# Patient Record
Sex: Female | Born: 1975 | Hispanic: No | Marital: Married | Smoking: Former smoker
Health system: Southern US, Community
[De-identification: ages and names within clinical notes are randomized; demographics above are authoritative.]

## PROBLEM LIST (undated history)

## (undated) DIAGNOSIS — F32A Depression, unspecified: Secondary | ICD-10-CM

## (undated) DIAGNOSIS — F329 Major depressive disorder, single episode, unspecified: Secondary | ICD-10-CM

## (undated) DIAGNOSIS — C029 Malignant neoplasm of tongue, unspecified: Secondary | ICD-10-CM

## (undated) DIAGNOSIS — T884XXA Failed or difficult intubation, initial encounter: Secondary | ICD-10-CM

## (undated) DIAGNOSIS — N809 Endometriosis, unspecified: Secondary | ICD-10-CM

## (undated) HISTORY — DX: Endometriosis, unspecified: N80.9

## (undated) HISTORY — PX: ABDOMINAL HYSTERECTOMY: SHX81

## (undated) HISTORY — PX: COLON RESECTION: SHX5231

## (undated) MED FILL — Dexamethasone Sodium Phosphate Inj 100 MG/10ML: INTRAMUSCULAR | Qty: 1 | Status: AC

---

## 2010-04-19 ENCOUNTER — Emergency Department: Payer: Self-pay | Admitting: Emergency Medicine

## 2017-02-12 ENCOUNTER — Inpatient Hospital Stay
Admission: EM | Admit: 2017-02-12 | Discharge: 2017-02-15 | DRG: 761 | Disposition: A | Discharge status: Home / Self Care | Payer: Self-pay | Attending: Obstetrics and Gynecology | Admitting: Obstetrics and Gynecology

## 2017-02-12 ENCOUNTER — Emergency Department: Payer: Self-pay

## 2017-02-12 ENCOUNTER — Encounter: Payer: Self-pay | Admitting: Emergency Medicine

## 2017-02-12 DIAGNOSIS — N7093 Salpingitis and oophoritis, unspecified: Secondary | ICD-10-CM | POA: Diagnosis present

## 2017-02-12 DIAGNOSIS — F17210 Nicotine dependence, cigarettes, uncomplicated: Secondary | ICD-10-CM | POA: Diagnosis present

## 2017-02-12 DIAGNOSIS — R1031 Right lower quadrant pain: Secondary | ICD-10-CM

## 2017-02-12 DIAGNOSIS — N80129 Deep endometriosis of ovary, unspecified ovary: Secondary | ICD-10-CM | POA: Diagnosis present

## 2017-02-12 DIAGNOSIS — N802 Endometriosis of fallopian tube: Principal | ICD-10-CM | POA: Diagnosis present

## 2017-02-12 DIAGNOSIS — N809 Endometriosis, unspecified: Secondary | ICD-10-CM | POA: Diagnosis present

## 2017-02-12 LAB — CBC
HCT: 43.7 % (ref 35.0–47.0)
Hemoglobin: 14.9 g/dL (ref 12.0–16.0)
MCH: 30.9 pg (ref 26.0–34.0)
MCHC: 34 g/dL (ref 32.0–36.0)
MCV: 91 fL (ref 80.0–100.0)
Platelets: 265 10*3/uL (ref 150–440)
RBC: 4.8 MIL/uL (ref 3.80–5.20)
RDW: 13.9 % (ref 11.5–14.5)
WBC: 11 10*3/uL (ref 3.6–11.0)

## 2017-02-12 LAB — URINALYSIS, COMPLETE (UACMP) WITH MICROSCOPIC
BILIRUBIN URINE: NEGATIVE
Glucose, UA: NEGATIVE mg/dL
HGB URINE DIPSTICK: NEGATIVE
KETONES UR: 5 mg/dL — AB
LEUKOCYTES UA: NEGATIVE
NITRITE: NEGATIVE
PROTEIN: NEGATIVE mg/dL
Specific Gravity, Urine: 1.014 (ref 1.005–1.030)
pH: 5 (ref 5.0–8.0)

## 2017-02-12 LAB — COMPREHENSIVE METABOLIC PANEL
ALBUMIN: 4.7 g/dL (ref 3.5–5.0)
ALT: 10 U/L — ABNORMAL LOW (ref 14–54)
ANION GAP: 7 (ref 5–15)
AST: 15 U/L (ref 15–41)
Alkaline Phosphatase: 57 U/L (ref 38–126)
BILIRUBIN TOTAL: 0.7 mg/dL (ref 0.3–1.2)
BUN: 7 mg/dL (ref 6–20)
CO2: 25 mmol/L (ref 22–32)
Calcium: 9.4 mg/dL (ref 8.9–10.3)
Chloride: 106 mmol/L (ref 101–111)
Creatinine, Ser: 0.77 mg/dL (ref 0.44–1.00)
GFR calc Af Amer: >60 mL/min (ref >60)
GFR calc non Af Amer: >60 mL/min (ref >60)
GLUCOSE: 104 mg/dL — AB (ref 65–99)
POTASSIUM: 4.1 mmol/L (ref 3.5–5.1)
SODIUM: 138 mmol/L (ref 135–145)
TOTAL PROTEIN: 7.6 g/dL (ref 6.5–8.1)

## 2017-02-12 LAB — WET PREP, GENITAL
SPERM: NONE SEEN
Trich, Wet Prep: NONE SEEN
Yeast Wet Prep HPF POC: NONE SEEN

## 2017-02-12 LAB — LIPASE, BLOOD: LIPASE: 17 U/L (ref 11–51)

## 2017-02-12 LAB — CHLAMYDIA/NGC RT PCR (ARMC ONLY)
Chlamydia Tr: NOT DETECTED
N gonorrhoeae: NOT DETECTED

## 2017-02-12 LAB — DIFFERENTIAL
BASOS PCT: 0 %
Basophils Absolute: 0 10*3/uL (ref 0–0.1)
EOS PCT: 0 %
Eosinophils Absolute: 0 10*3/uL (ref 0–0.7)
LYMPHS PCT: 20 %
Lymphs Abs: 2.2 10*3/uL (ref 1.0–3.6)
Monocytes Absolute: 0.5 10*3/uL (ref 0.2–0.9)
Monocytes Relative: 5 %
NEUTROS PCT: 75 %
Neutro Abs: 8.1 10*3/uL — ABNORMAL HIGH (ref 1.4–6.5)

## 2017-02-12 LAB — POCT PREGNANCY, URINE: PREG TEST UR: NEGATIVE

## 2017-02-12 MED ORDER — LACTATED RINGERS IV SOLN
125.0000 mL/h | INTRAVENOUS | Status: DC
  Administered 2017-02-12 – 2017-02-15 (×7): 125 mL/h via INTRAVENOUS

## 2017-02-12 MED ORDER — DOXYCYCLINE HYCLATE 100 MG PO TABS
100.0000 mg | ORAL_TABLET | Freq: Two times a day (BID) | ORAL | Status: DC
  Administered 2017-02-12 – 2017-02-15 (×6): 100 mg via ORAL
  Filled 2017-02-12 (×8): qty 1

## 2017-02-12 MED ORDER — DEXTROSE 5 % IV SOLN
2.0000 g | Freq: Four times a day (QID) | INTRAVENOUS | Status: DC
  Administered 2017-02-12: 2 g via INTRAVENOUS
  Filled 2017-02-12 (×4): qty 2

## 2017-02-12 MED ORDER — IOPAMIDOL (ISOVUE-300) INJECTION 61%
100.0000 mL | Freq: Once | INTRAVENOUS | Status: AC | PRN
  Administered 2017-02-12: 100 mL via INTRAVENOUS
  Filled 2017-02-12: qty 100

## 2017-02-12 MED ORDER — NICOTINE 14 MG/24HR TD PT24
14.0000 mg | MEDICATED_PATCH | Freq: Every day | TRANSDERMAL | Status: DC
  Administered 2017-02-12 – 2017-02-14 (×3): 14 mg via TRANSDERMAL
  Filled 2017-02-12 (×3): qty 1

## 2017-02-12 MED ORDER — MORPHINE SULFATE (PF) 4 MG/ML IV SOLN
4.0000 mg | INTRAVENOUS | Status: DC | PRN
  Administered 2017-02-12: 4 mg via INTRAVENOUS
  Filled 2017-02-12: qty 1

## 2017-02-12 MED ORDER — ONDANSETRON HCL 4 MG/2ML IJ SOLN
4.0000 mg | Freq: Once | INTRAMUSCULAR | Status: AC
  Administered 2017-02-12: 4 mg via INTRAVENOUS
  Filled 2017-02-12: qty 2

## 2017-02-12 MED ORDER — MORPHINE SULFATE (PF) 2 MG/ML IV SOLN
INTRAVENOUS | Status: AC
  Administered 2017-02-12: 2 mg via INTRAVENOUS
  Filled 2017-02-12: qty 1

## 2017-02-12 MED ORDER — MORPHINE SULFATE (PF) 4 MG/ML IV SOLN
2.0000 mg | INTRAVENOUS | Status: DC | PRN
  Administered 2017-02-13 – 2017-02-15 (×5): 2 mg via INTRAVENOUS
  Filled 2017-02-12 (×5): qty 1

## 2017-02-12 MED ORDER — DEXTROSE 5 % IV SOLN
2.0000 g | Freq: Four times a day (QID) | INTRAVENOUS | Status: DC
  Administered 2017-02-13 (×2): 2 g via INTRAVENOUS
  Filled 2017-02-12 (×5): qty 2

## 2017-02-12 NOTE — ED Triage Notes (Signed)
C/O RLQ abdominal pain. Onset, 40 minutes PTA.  Pain associated with nausea and feeling flushed.  Patient took an Alleve, and pain has improved.

## 2017-02-12 NOTE — ED Provider Notes (Signed)
[facility] Emergency Department Provider Note    First MD Initiated Contact with Patient 02/12/17 1426     (approximate)  I have reviewed the triage vital signs and the nursing notes.   HISTORY  Chief Complaint Abdominal Pain    HPI Holly Vega is a 41 y.o. female who is previously healthy presents with acute onset of right lower quadrant pain that occurred roughly 40 minutes prior to arrival. States that over the past several days she has been feeling some achy pain and her periumbilical region that has radiated to the right lower quadrant. Prior to arrival she states that she was having severe 10 out of 10 pain with nausea and feeling flushed. Subjective fevers. States the pain lasted roughly 40 minutes and then suddenly dissipated. She took an Aleve thinking the ER. Denies any chance of being pregnant. No vaginal discharge or bleeding. No previous abdominal surgeries.   History reviewed. No pertinent past medical history. Brady: no h/o IBD History reviewed. No pertinent surgical history. There are no active problems to display for this patient.     Prior to Admission medications   Not on File    Allergies Patient has no known allergies.    Social History Social History  Substance Use Topics  . Smoking status: Current Every Day Smoker    Types: Cigarettes  . Smokeless tobacco: Never Used  . Alcohol use No    Review of Systems Patient denies headaches, rhinorrhea, blurry vision, numbness, shortness of breath, chest pain, edema, cough, abdominal pain, nausea, vomiting, diarrhea, dysuria, fevers, rashes or hallucinations unless otherwise stated above in HPI. ____________________________________________   PHYSICAL EXAM:  VITAL SIGNS: Vitals:   02/12/17 1313 02/12/17 1424  BP: 139/86 112/73  Pulse: 86 72  Resp: 18 16  Temp: 98.6 F (37 C)     Constitutional: Alert and oriented. Well appearing and in no acute distress. Eyes:  Conjunctivae are normal. PERRL. EOMI. Head: Atraumatic. Nose: No congestion/rhinnorhea. Mouth/Throat: Mucous membranes are moist.  Oropharynx non-erythematous. Neck: No stridor. Painless ROM. No cervical spine tenderness to palpation Hematological/Lymphatic/Immunilogical: No cervical lymphadenopathy. Cardiovascular: Normal rate, regular rhythm. Grossly normal heart sounds.  Good peripheral circulation. Respiratory: Normal respiratory effort.  No retractions. Lungs CTAB. Gastrointestinal: Soft with + rLQ ttp but no rebound or guarding. No distention. No abdominal bruits. No CVA tenderness. Genitourinary: pelvis exam with right adnexal fullness and ttp.  No significant drainage from cervix or cmt Musculoskeletal: No lower extremity tenderness nor edema.  No joint effusions. Neurologic:  Normal speech and language. No gross focal neurologic deficits are appreciated. No gait instability. Skin:  Skin is warm, dry and intact. No rash noted. Psychiatric: Mood and affect are normal. Speech and behavior are normal.  ____________________________________________   LABS (all labs ordered are listed, but only abnormal results are displayed)  Results for orders placed or performed during the hospital encounter of 02/12/17 (from the past 24 hour(s))  Lipase, blood     Status: None   Collection Time: 02/12/17  1:27 PM  Result Value Ref Range   Lipase 17 11 - 51 U/L  Comprehensive metabolic panel     Status: Abnormal   Collection Time: 02/12/17  1:27 PM  Result Value Ref Range   Sodium 138 135 - 145 mmol/L   Potassium 4.1 3.5 - 5.1 mmol/L   Chloride 106 101 - 111 mmol/L   CO2 25 22 - 32 mmol/L   Glucose, Bld 104 (H) 65 - 99 mg/dL  BUN 7 6 - 20 mg/dL   Creatinine, Ser 0.77 0.44 - 1.00 mg/dL   Calcium 9.4 8.9 - 10.3 mg/dL   Total Protein 7.6 6.5 - 8.1 g/dL   Albumin 4.7 3.5 - 5.0 g/dL   AST 15 15 - 41 U/L   ALT 10 (L) 14 - 54 U/L   Alkaline Phosphatase 57 38 - 126 U/L   Total Bilirubin 0.7 0.3  - 1.2 mg/dL   GFR calc non Af Amer >60 >60 mL/min   GFR calc Af Amer >60 >60 mL/min   Anion gap 7 5 - 15  CBC     Status: None   Collection Time: 02/12/17  1:27 PM  Result Value Ref Range   WBC 11.0 3.6 - 11.0 K/uL   RBC 4.80 3.80 - 5.20 MIL/uL   Hemoglobin 14.9 12.0 - 16.0 g/dL   HCT 43.7 35.0 - 47.0 %   MCV 91.0 80.0 - 100.0 fL   MCH 30.9 26.0 - 34.0 pg   MCHC 34.0 32.0 - 36.0 g/dL   RDW 13.9 11.5 - 14.5 %   Platelets 265 150 - 440 K/uL  Urinalysis, Complete w Microscopic     Status: Abnormal   Collection Time: 02/12/17  1:27 PM  Result Value Ref Range   Color, Urine YELLOW (A) YELLOW   APPearance HAZY (A) CLEAR   Specific Gravity, Urine 1.014 1.005 - 1.030   pH 5.0 5.0 - 8.0   Glucose, UA NEGATIVE NEGATIVE mg/dL   Hgb urine dipstick NEGATIVE NEGATIVE   Bilirubin Urine NEGATIVE NEGATIVE   Ketones, ur 5 (A) NEGATIVE mg/dL   Protein, ur NEGATIVE NEGATIVE mg/dL   Nitrite NEGATIVE NEGATIVE   Leukocytes, UA NEGATIVE NEGATIVE   RBC / HPF 0-5 0 - 5 RBC/hpf   WBC, UA 0-5 0 - 5 WBC/hpf   Bacteria, UA RARE (A) NONE SEEN   Squamous Epithelial / LPF 0-5 (A) NONE SEEN   Mucous PRESENT   Pregnancy, urine POC     Status: None   Collection Time: 02/12/17  1:35 PM  Result Value Ref Range   Preg Test, Ur NEGATIVE NEGATIVE   ____________________________________________ ____________________________________________  RADIOLOGY I personally reviewed all radiographic images ordered to evaluate for the above acute complaints and reviewed radiology reports and findings.  These findings were personally discussed with the patient.  Please see medical record for radiology report.  ____________________________________________   PROCEDURES  Procedure(s) performed:  Procedures    Critical Care performed: no ____________________________________________   INITIAL IMPRESSION / ASSESSMENT AND PLAN / ED COURSE  Pertinent labs & imaging results that were available during my care of the  patient were reviewed by me and considered in my medical decision making (see chart for details).  DDX: appy, stone, uti, cyst  Holly Vega is a 41 y.o. who presents to the ED with acute lower quadrant pain as described above. Patient is AFVSS in ED. Exam as above. Given current presentation have considered the above differential.  Blood work is fairly reassuring. Less consistent with UTI or stone. Based on sudden onset of severe pain in the right lower quadrant do feel that CT imaging clinically indicated as she does have borderline elevated leukocytosis and concern for acute appendicitis.  Clinical Course as of Feb 12 1930  Sun Feb 12, 2017  1523 CT notes possible abscess vs cyst. We'll order ultrasound to further characterize fluid collection in the right lower quadrant.  [PR]    Clinical Course User Index [PR] Quentin Cornwall,  Saralyn Pilar, MD   Ultrasound shows evidence of probable pyosalpinx and tubo-ovarian abscess. Based on these results and consult to Dr. Glennon Mac with gynecology has evaluated patient. She is not overtly septic at this point I have held off on antibiotics and consultation with Dr. Glennon Mac. He is currently agreed to admit patient for further evaluation and management.  Have discussed with the patient and available family all diagnostics and treatments performed thus far and all questions were answered to the best of my ability. The patient demonstrates understanding and agreement with plan.   ____________________________________________   FINAL CLINICAL IMPRESSION(S) / ED DIAGNOSES  Final diagnoses:  Abdominal pain, RLQ  Tubo-ovarian abscess      NEW MEDICATIONS STARTED DURING THIS VISIT:  New Prescriptions   No medications on file     Note:  This document was prepared using Dragon voice recognition software and may include unintentional dictation errors.    Merlyn Lot, MD 02/12/17 [phone]

## 2017-02-13 DIAGNOSIS — N7093 Salpingitis and oophoritis, unspecified: Secondary | ICD-10-CM

## 2017-02-13 DIAGNOSIS — N802 Endometriosis of fallopian tube: Secondary | ICD-10-CM

## 2017-02-13 MED ORDER — CEFOXITIN SODIUM-DEXTROSE 2-2.2 GM-% IV SOLR (PREMIX)
2.0000 g | Freq: Four times a day (QID) | INTRAVENOUS | Status: DC
  Administered 2017-02-13 – 2017-02-15 (×7): 2000 mg via INTRAVENOUS
  Filled 2017-02-13 (×11): qty 50

## 2017-02-13 NOTE — Progress Notes (Signed)
Obstetric and Gynecology  Subjective  Feeling better since admission, still some discomfort lower abdomen midline to slightly right of midline.  No fevers, no chills.  Tolerating po  Objective   Vitals:   02/13/17 0356 02/13/17 0748  BP: (!) 100/54 (!) 108/57  Pulse: 65 67  Resp: 17 17  Temp: 98 F (36.7 C) 98 F (36.7 C)     Intake/Output Summary (Last 24 hours) at 02/13/17 0753 Last data filed at 02/13/17 0865  Gross per 24 hour  Intake          1061.25 ml  Output              800 ml  Net           261.25 ml    General: NAD Pulmonary: no increased work of breathing Abdomen: soft, non-distended, some suprapubic tenderness slightly right of midline, no rebound, no guarding Extremities: no edema  Labs: Results for orders placed or performed during the hospital encounter of 02/12/17 (from the past 24 hour(s))  Lipase, blood     Status: None   Collection Time: 02/12/17  1:27 PM  Result Value Ref Range   Lipase 17 11 - 51 U/L  Comprehensive metabolic panel     Status: Abnormal   Collection Time: 02/12/17  1:27 PM  Result Value Ref Range   Sodium 138 135 - 145 mmol/L   Potassium 4.1 3.5 - 5.1 mmol/L   Chloride 106 101 - 111 mmol/L   CO2 25 22 - 32 mmol/L   Glucose, Bld 104 (H) 65 - 99 mg/dL   BUN 7 6 - 20 mg/dL   Creatinine, Ser 0.77 0.44 - 1.00 mg/dL   Calcium 9.4 8.9 - 10.3 mg/dL   Total Protein 7.6 6.5 - 8.1 g/dL   Albumin 4.7 3.5 - 5.0 g/dL   AST 15 15 - 41 U/L   ALT 10 (L) 14 - 54 U/L   Alkaline Phosphatase 57 38 - 126 U/L   Total Bilirubin 0.7 0.3 - 1.2 mg/dL   GFR calc non Af Amer >60 >60 mL/min   GFR calc Af Amer >60 >60 mL/min   Anion gap 7 5 - 15  CBC     Status: None   Collection Time: 02/12/17  1:27 PM  Result Value Ref Range   WBC 11.0 3.6 - 11.0 K/uL   RBC 4.80 3.80 - 5.20 MIL/uL   Hemoglobin 14.9 12.0 - 16.0 g/dL   HCT 43.7 35.0 - 47.0 %   MCV 91.0 80.0 - 100.0 fL   MCH 30.9 26.0 - 34.0 pg   MCHC 34.0 32.0 - 36.0 g/dL   RDW 13.9 11.5 - 14.5  %   Platelets 265 150 - 440 K/uL  Urinalysis, Complete w Microscopic     Status: Abnormal   Collection Time: 02/12/17  1:27 PM  Result Value Ref Range   Color, Urine YELLOW (A) YELLOW   APPearance HAZY (A) CLEAR   Specific Gravity, Urine 1.014 1.005 - 1.030   pH 5.0 5.0 - 8.0   Glucose, UA NEGATIVE NEGATIVE mg/dL   Hgb urine dipstick NEGATIVE NEGATIVE   Bilirubin Urine NEGATIVE NEGATIVE   Ketones, ur 5 (A) NEGATIVE mg/dL   Protein, ur NEGATIVE NEGATIVE mg/dL   Nitrite NEGATIVE NEGATIVE   Leukocytes, UA NEGATIVE NEGATIVE   RBC / HPF 0-5 0 - 5 RBC/hpf   WBC, UA 0-5 0 - 5 WBC/hpf   Bacteria, UA RARE (A) NONE SEEN   Squamous  Epithelial / LPF 0-5 (A) NONE SEEN   Mucous PRESENT   Differential     Status: Abnormal   Collection Time: 02/12/17  1:27 PM  Result Value Ref Range   Neutrophils Relative % 75 %   Neutro Abs 8.1 (H) 1.4 - 6.5 K/uL   Lymphocytes Relative 20 %   Lymphs Abs 2.2 1.0 - 3.6 K/uL   Monocytes Relative 5 %   Monocytes Absolute 0.5 0.2 - 0.9 K/uL   Eosinophils Relative 0 %   Eosinophils Absolute 0.0 0 - 0.7 K/uL   Basophils Relative 0 %   Basophils Absolute 0.0 0 - 0.1 K/uL  Pregnancy, urine POC     Status: None   Collection Time: 02/12/17  1:35 PM  Result Value Ref Range   Preg Test, Ur NEGATIVE NEGATIVE  Chlamydia/NGC rt PCR ([facility] only)     Status: None   Collection Time: 02/12/17  5:22 PM  Result Value Ref Range   Specimen source GC/Chlam ENDOCERVICAL    Chlamydia Tr NOT DETECTED NOT DETECTED   N gonorrhoeae NOT DETECTED NOT DETECTED  Wet prep, genital     Status: Abnormal   Collection Time: 02/12/17  5:22 PM  Result Value Ref Range   Yeast Wet Prep HPF POC NONE SEEN NONE SEEN   Trich, Wet Prep NONE SEEN NONE SEEN   Clue Cells Wet Prep HPF POC PRESENT (A) NONE SEEN   WBC, Wet Prep HPF POC FEW (A) NONE SEEN   Sperm NONE SEEN     Cultures: Results for orders placed or performed during the hospital encounter of 02/12/17  Chlamydia/NGC rt PCR ([facility] only)      Status: None   Collection Time: 02/12/17  5:22 PM  Result Value Ref Range Status   Specimen source GC/Chlam ENDOCERVICAL  Final   Chlamydia Tr NOT DETECTED NOT DETECTED Final   N gonorrhoeae NOT DETECTED NOT DETECTED Final    Comment: (NOTE) 100  This methodology has not been evaluated in pregnant women or in 200  patients with a history of hysterectomy. 300 400  This methodology will not be performed on patients less than 13  years of age.   Wet prep, genital     Status: Abnormal   Collection Time: 02/12/17  5:22 PM  Result Value Ref Range Status   Yeast Wet Prep HPF POC NONE SEEN NONE SEEN Final   Trich, Wet Prep NONE SEEN NONE SEEN Final   Clue Cells Wet Prep HPF POC PRESENT (A) NONE SEEN Final   WBC, Wet Prep HPF POC FEW (A) NONE SEEN Final   Sperm NONE SEEN  Final    Imaging:  Assessment   41 y.o. HD2 TOA  Plan   1) TOA - will be 24-hrs of cefoxitin/doxy this afternoon.  Anticipated 48-hrs course.  If further improvement with IV abx discussed discharge at 48-hr mark with 14 days course of doxycyline outpatient.  If no improvement we discussed IR drainage.

## 2017-02-13 NOTE — H&P (Addendum)
GYNECOLOGY ADMISSION HISTORY AND PHYSICAL NOTE    Attending Provider: Prentice Docker, MD   Holly Vega [id number]  Chief Complaint:   Holly Vega is a 41 y.o. G3P1A2 female seen at the request of Dr Merlyn Lot for evaluation of suspected tubo-ovarian abscess.    History of Present Ilness:   The patient presented to the ER with a sudden onset of peri-umbilical and right lower quadrant abdominal pain. She describes the pain as being crampy, like a bad period.  The pain does not radiate. It comes and goes.  When present, the pain level is "12" out of 10.  Nothing makes it worse. Ibuprofen helps a little.  No associated symptoms. Specifically denies fevers, chills, nausea, emesis, diarrhea, constipation.  She does not a slight change in her discharge, but no other specific vaginal symptoms.  Denies dysuria, hematuria, frequency. Denies hematochezia and melena.    Past Medical History: negative  Past Surgica History: negative  Allergies: No Known Allergies   Prior to Admission medications   Medication Sig Start Date End Date Taking? Authorizing Provider  naproxen sodium (ANAPROX) 220 MG tablet Take 220 mg by mouth 2 (two) times daily with a meal.   Yes [provider]    Obstetric History: [id number], s/p SVD x 1.    Gynecologic History:   Menarche age: 65 Patient's last menstrual period was 02/09/2017. Cycle Hx: regular every 28 days without intermenstrual spotting Last Pap: patient unsure She denies a history of abnormal pap smears  She denies history of STDs Contraception: denies   Social History:  She  reports that she has been smoking Cigarettes.  She has never used smokeless tobacco. She reports that she does not drink alcohol or use drugs.  Family History:  Denies family history of gynecologic cancers. Notes no other family medical history.   Review of Systems:   Review of Systems  Constitutional: Negative.  Negative for chills and fever.  HENT: Negative.    Eyes: Negative.   Respiratory: Negative.   Cardiovascular: Negative.   Gastrointestinal: Positive for abdominal pain (RLQ, periumbilical area). Negative for blood in stool, constipation, diarrhea, melena, nausea and vomiting.  Genitourinary: Negative.  Negative for dysuria, flank pain, frequency, hematuria and urgency.  Musculoskeletal: Negative.  Negative for back pain.  Skin: Negative.   Neurological: Negative.   Endo/Heme/Allergies: Does not bruise/bleed easily.  Psychiatric/Behavioral: Negative.      Objective    BP (!) 108/57 (BP Location: Left Arm)   Pulse 67   Temp 98 F (36.7 C) (Axillary)   Resp 17   Ht 5\' 7"  (1.702 m)   Wt 153 lb (69.4 kg)   LMP 02/09/2017 Comment: neg preg test 02/12/17  SpO2 99%   BMI 23.96 kg/m  Physical Exam  Physical Exam  Constitutional: She is oriented to person, place, and time. She appears well-developed and well-nourished. No distress.  HENT:  Head: Normocephalic and atraumatic.  Eyes: Conjunctivae are normal.  Neck: Normal range of motion. Neck supple. No thyromegaly present.  Cardiovascular: Normal rate, regular rhythm and normal heart sounds.  Exam reveals no gallop and no friction rub.   No murmur heard. Pulmonary/Chest: Effort normal and breath sounds normal. She has no wheezes.  Abdominal: Soft. She exhibits no distension and no mass. There is tenderness (mild ttp in RLQ). There is no rebound and no guarding. No hernia. Hernia confirmed negative in the right inguinal area and confirmed negative in the left inguinal area.  Genitourinary: Pelvic exam was performed with  patient supine. There is no rash, tenderness or lesion on the right labia. There is no rash, tenderness or lesion on the left labia.  Genitourinary Comments: Deferred by me. Performed by ER MD with collection of samples for GC/chlamydia  Musculoskeletal: Normal range of motion.  Lymphadenopathy:       Right: No inguinal adenopathy present.       Left: No inguinal  adenopathy present.  Neurological: She is alert and oriented to person, place, and time.  Skin: Skin is warm and dry. No rash noted.  Psychiatric: She has a normal mood and affect. Her behavior is normal.    Laboratory Results:   Lab Results  Component Value Date   WBC 11.0 02/12/2017   RBC 4.80 02/12/2017   HGB 14.9 02/12/2017   HCT 43.7 02/12/2017   PLT 265 02/12/2017   NA 138 02/12/2017   K 4.1 02/12/2017   CREATININE 0.77 02/12/2017   Lab Results  Component Value Date   PREGTESTUR NEGATIVE 02/12/2017    Imaging Results:  US Transvaginal Non-ob  Result Date: 02/12/2017 CLINICAL DATA:  Right lower quadrant abdominal pain with abnormal cystic lesion in the right adnexal on recent CT examination. EXAM: TRANSABDOMINAL AND TRANSVAGINAL ULTRASOUND OF PELVIS TECHNIQUE: Both transabdominal and transvaginal ultrasound examinations of the pelvis were performed. Transabdominal technique was performed for global imaging of the pelvis including uterus, ovaries, adnexal regions, and pelvic cul-de-sac. It was necessary to proceed with endovaginal exam following the transabdominal exam to visualize the ovaries. COMPARISON:  CT from earlier in the same day. FINDINGS: Uterus Measurements: 7.2 x 3.0 x 4.4 cm. No fibroids or other mass visualized. Endometrium Thickness: 12 mm.  No focal abnormality visualized. Right ovary Measurements: 7.1 x 5.8 x 7.0 cm. Large 6.8 cm cystic area is noted emanating from the right ovary similar to that seen on recent CT examination. Internal echogenicity consistent with debris is noted. There is an adjacent area of dilatation with echogenicity within similar to that seen on recent CT likely representing a dilated fallopian tube. In the internal debris this likely represents a pyosalpinx. Left ovary Measurements: 4.2 x 1.6 x 2.2 cm. Normal appearance/no adnexal mass. Other findings No abnormal free fluid. IMPRESSION: Changes most consistent with a right tubo-ovarian abscess.  Electronically Signed   By: Inez Catalina M.D.   On: 02/12/2017 16:49   US Pelvis Complete  Result Date: 02/12/2017 CLINICAL DATA:  Right lower quadrant abdominal pain with abnormal cystic lesion in the right adnexal on recent CT examination. EXAM: TRANSABDOMINAL AND TRANSVAGINAL ULTRASOUND OF PELVIS TECHNIQUE: Both transabdominal and transvaginal ultrasound examinations of the pelvis were performed. Transabdominal technique was performed for global imaging of the pelvis including uterus, ovaries, adnexal regions, and pelvic cul-de-sac. It was necessary to proceed with endovaginal exam following the transabdominal exam to visualize the ovaries. COMPARISON:  CT from earlier in the same day. FINDINGS: Uterus Measurements: 7.2 x 3.0 x 4.4 cm. No fibroids or other mass visualized. Endometrium Thickness: 12 mm.  No focal abnormality visualized. Right ovary Measurements: 7.1 x 5.8 x 7.0 cm. Large 6.8 cm cystic area is noted emanating from the right ovary similar to that seen on recent CT examination. Internal echogenicity consistent with debris is noted. There is an adjacent area of dilatation with echogenicity within similar to that seen on recent CT likely representing a dilated fallopian tube. In the internal debris this likely represents a pyosalpinx. Left ovary Measurements: 4.2 x 1.6 x 2.2 cm. Normal appearance/no adnexal mass. Other findings  No abnormal free fluid. IMPRESSION: Changes most consistent with a right tubo-ovarian abscess. Electronically Signed   By: Inez Catalina M.D.   On: 02/12/2017 16:49   Ct Abdomen Pelvis W Contrast  Result Date: 02/12/2017 CLINICAL DATA:  Acute onset RIGHT lower quadrant pain, nausea. Assess for appendicitis. EXAM: CT ABDOMEN AND PELVIS WITH CONTRAST TECHNIQUE: Multidetector CT imaging of the abdomen and pelvis was performed using the standard protocol following bolus administration of intravenous contrast. CONTRAST:  159mL ISOVUE-300 IOPAMIDOL (ISOVUE-300) INJECTION 61%  COMPARISON:  None. FINDINGS: LOWER CHEST: Lung bases are clear. Included heart size is normal. No pericardial effusion. HEPATOBILIARY: Liver and gallbladder are normal. PANCREAS: Normal. SPLEEN: Normal. ADRENALS/URINARY TRACT: Kidneys are orthotopic, demonstrating symmetric enhancement. No nephrolithiasis, hydronephrosis or solid renal masses. The unopacified ureters are normal in course and caliber. Delayed imaging through the kidneys demonstrates symmetric prompt contrast excretion within the proximal urinary collecting system. Urinary bladder is partially distended and unremarkable. Normal adrenal glands. STOMACH/BOWEL: The stomach, small and large bowel are normal in course and caliber without inflammatory changes. Normal appendix. VASCULAR/LYMPHATIC: Aortoiliac vessels are normal in course and caliber, mild calcific atherosclerosis. No lymphadenopathy by CT size criteria. REPRODUCTIVE: 8.1 x 5.9 cm eccentrically thick-walled RIGHT adnexal cyst. Tubular fluid-filled 2.8 cm structure RIGHT lower quadrant. Multiple follicles measuring to 8 mm LEFT adnexum. OTHER: No intraperitoneal free fluid or free air. MUSCULOSKELETAL: Grade 1 L5-S1 retrolisthesis with severe degenerative disc with vacuum phenomena, mild canal stenosis and moderate neural foraminal narrowing. IMPRESSION: RIGHT adnexal 8.1 cm cyst versus abscess with ipsilateral hydro- versus pyosalpinx. Recommend ultrasound and correlation with gynecological examination. Normal appendix. Electronically Signed   By: Elon Alas M.D.   On: 02/12/2017 15:08      Assessment & Plan   Holly Vega is a 41 y.o. G58P1021 female with likely TOA and pyosalpynx.    Plan:  1.  Size and clinical stability indicate patient would be a good candidate for inpatient IV antibiotic treatment. Will start cefoxitin 2 gram Q 6 and doxycycline 100 mg po BID, per CDC recommendations.  Will not initially give metronidazole.   2.  Discussed that if she does not respond,  may have to consider IR drainage.    Prentice Docker, MD 02/13/2017 8:38 AM

## 2017-02-14 ENCOUNTER — Inpatient Hospital Stay: Payer: Self-pay

## 2017-02-14 DIAGNOSIS — N802 Endometriosis of fallopian tube: Secondary | ICD-10-CM

## 2017-02-14 DIAGNOSIS — N7093 Salpingitis and oophoritis, unspecified: Secondary | ICD-10-CM

## 2017-02-14 LAB — HIV ANTIBODY (ROUTINE TESTING W REFLEX): HIV SCREEN 4TH GENERATION: NONREACTIVE

## 2017-02-14 NOTE — Progress Notes (Signed)
Patient ID: Holly Vega, female   DOB: 1975-10-05, 41 y.o.   MRN: [id number]  Daily Benign Gynecology Progress Note Holly Vega  [id number]  HD#3  Subjective:  Overnight Events: no acute events Complaints: none currently. Pain improved.  She denies: fevers, chills, chest pain, trouble breathing, nausea, vomiting, severe abdominal pain.  She has tolerated: normal diet as tolerated She reports her pain is well controlled with PO pain meds.   She is ambulating and is voiding.  Objective:  Most recent vitals Temp: 98.7 F (37.1 C)  BP: 117/63  Pulse Rate: 63  Resp: 20  SpO2: 98 %   Vitals Range over 24 hours Temp  Avg: 98.2 F (36.8 C)  Min: 97.8 F (36.6 C)  Max: 98.7 F (37.1 C) BP  Min: 100/65  Max: 132/56 Pulse  Avg: 59.8  Min: 54  Max: 67 SpO2  Avg: 98.3 %  Min: 97 %  Max: 100 %   Physical Exam General: alert, well appearing, and in no distress Heart: regular rate and rhythm, no murmurs Lungs: clear to auscultation, no wheezes, rales or rhonchi, symmetric air entry Abdomen: Soft, mildly ttp in RLQ, +BS, no guarding or rebound Extremities: no redness or tenderness in the calves or thighs, no edema, TED hose in place  AM Labs Lab Results  Component Value Date   WBC 11.0 02/12/2017   HGB 14.9 02/12/2017   HCT 43.7 02/12/2017   PLT 265 02/12/2017   NA 138 02/12/2017   K 4.1 02/12/2017   CREATININE 0.77 02/12/2017   BUN 7 02/12/2017     Assessment:  Holly Vega is a 41 y.o. female HD#3 admitted with TOA, pyosalpinx, doing well.     Plan:  ID: continue cefoxitin 2  Grams Q 6 hours, doxycycline 100 mg PO BID.  Will be at 48 hours at 9 pm tonight. Continue until morning.  Repeat u/s in AM to assess interval change. If stable or reduced, will switch to PO abx and discharge.  If increased in size, will consider getting IR drainage. Nutrition:  Regular diet IVF: saline lock DVT Prophylaxis:  TED hose, ambulation Activity: as tolerated Anticipated Discharge:  tomorrow  Prentice Docker, MD  02/14/2017 9:46 AM

## 2017-02-15 ENCOUNTER — Telehealth: Payer: Self-pay | Admitting: Obstetrics & Gynecology

## 2017-02-15 DIAGNOSIS — N80129 Deep endometriosis of ovary, unspecified ovary: Secondary | ICD-10-CM | POA: Diagnosis present

## 2017-02-15 DIAGNOSIS — N802 Endometriosis of fallopian tube: Secondary | ICD-10-CM

## 2017-02-15 DIAGNOSIS — N809 Endometriosis, unspecified: Secondary | ICD-10-CM | POA: Diagnosis present

## 2017-02-15 DIAGNOSIS — N7093 Salpingitis and oophoritis, unspecified: Secondary | ICD-10-CM

## 2017-02-15 MED ORDER — OXYCODONE-ACETAMINOPHEN 5-325 MG PO TABS: 1.0000 | ORAL_TABLET | Freq: Four times a day (QID) | ORAL | 0 refills | Status: DC | PRN

## 2017-02-15 MED ORDER — DOXYCYCLINE HYCLATE 100 MG PO TABS: 100.0000 mg | ORAL_TABLET | Freq: Two times a day (BID) | ORAL | 0 refills | Status: DC

## 2017-02-15 NOTE — Progress Notes (Signed)
[facility] [address] [id number] [place] [address] Phone: [phone] Fax: [phone]  Feb 15, 2017  Patient: Holly Vega  Date of Birth: Aug 22, 1976  Date of Visit: 02/12/2017    To Whom It May Concern:  Sahara Fujimoto was seen and treated in our [facility] on 02/12/2017. Safari Cinque  may return to work on 02/17/17.  Sincerely,  Barnett Applebaum, MD [facility]

## 2017-02-15 NOTE — Discharge Summary (Signed)
Gynecology Physician Postoperative Discharge Summary  Patient ID: Holly Vega MRN: [id number] DOB/AGE: 1976-07-25 41 y.o.  Admit Date: 02/12/2017 Discharge Date: 02/15/2017 Diagnosis: Right lower quadrant pain, Endometrioma, Possible TuboOvarian Abscess  Significant Labs: CBC Latest Ref Rng & Units 02/12/2017  WBC 3.6 - 11.0 K/uL 11.0  Hemoglobin 12.0 - 16.0 g/dL 14.9  Hematocrit 35.0 - 47.0 % 43.7  Platelets 150 - 440 K/uL 265    Hospital Course:  Holly Vega is a 41 y.o. G68P1011 F with acute right lower quadrant pain on admission, with initial findings suggestive of TOA and so treated with ABX.  Pt has had no fever, WBC elevation, or pos culture.  Subsequent [place] is more suggestive of 7 cm endometrioma.  Pain has improved during her stay.  Pt has been counseled as to options for management, and will be discharged with close follow up.  Risk of continued pain or future torsion discussed.  Pt should have consideration for serial surveillance vs surgical management.  By time of discharge on day 2, her pain was controlled on oral pain medications; she was ambulating, voiding without difficulty, tolerating regular diet and passing flatus. She was deemed stable for discharge to home.   Discharge Exam: Blood pressure 129/70, pulse 64, temperature 98.2 F (36.8 C), temperature source Oral, resp. rate 18, height 5\' 7"  (1.702 m), weight 153 lb (69.4 kg), last menstrual period 02/09/2017, SpO2 100 %. General appearance: alert and no distress  Resp: clear to auscultation bilaterally  Cardio: regular rate and rhythm  GI: soft,  Min tender; bowel sounds normal; no masses, no organomegaly.  Extremities: extremities normal, atraumatic, no cyanosis or edema and Homans sign is negative, no sign of DVT  Discharged Condition: Stable  Disposition: Home, normal activity, follow up in office  Discharge Instructions    Activity as tolerated - No restrictions    Complete by:  As directed    Diet general     Complete by:  As directed      Allergies as of 02/15/2017   No Known Allergies     Medication List    TAKE these medications   doxycycline 100 MG tablet Commonly known as:  VIBRA-TABS Take 1 tablet (100 mg total) by mouth every 12 (twelve) hours.   naproxen sodium 220 MG tablet Commonly known as:  ANAPROX Take 220 mg by mouth 2 (two) times daily with a meal.   oxyCODONE-acetaminophen 5-325 MG tablet Commonly known as:  PERCOCET/ROXICET Take 1-2 tablets by mouth every 6 (six) hours as needed.      Follow-up Information    Will Bonnet, MD. Go in 1 week(s).   Specialty:  Obstetrics and Gynecology Contact information: [place] [address] [phone]           Barnett Applebaum, MD

## 2017-02-15 NOTE — Discharge Instructions (Signed)
YOU HAVE A FOLLOW-UP APPOINTMENT WITH: DR. HARRIS on 02/24/2017 at 11:30 AM At [facility] for CYST MANAGEMENT    Endometriosis Endometriosis is a condition in which the tissue that lines the uterus (endometrium) grows outside of its normal location. The tissue may grow in many locations close to the uterus, but it commonly grows on the ovaries, fallopian tubes, vagina, or bowel. When the uterus sheds the endometrium every menstrual cycle, there is bleeding wherever the endometrial tissue is located. This can cause pain because blood is irritating to tissues that are not normally exposed to it. What are the causes? The cause of endometriosis is not known. What increases the risk? You may be more likely to develop endometriosis if you:  Have a family history of endometriosis.  Have never given birth.  Started your period at age 65 or younger.  Have high levels of estrogen in your body.  Were exposed to a certain medicine (diethylstilbestrol) before you were born (in utero).  Had low birth weight.  Were born as a twin, triplet, or other multiple.  Have a BMI of less than 25. BMI is an estimate of body fat and is calculated from height and weight. What are the signs or symptoms? Often, there are no symptoms of this condition. If you do have symptoms, they may:  Vary depending on where your endometrial tissue is growing.  Occur during your menstrual period (most common) or midcycle.  Come and go, or you may go months with no symptoms at all.  Stop with menopause. Symptoms may include:  Pain in the back or abdomen.  Heavier bleeding during periods.  Pain during sex.  Painful bowel movements.  Infertility.  Pelvic pain.  Bleeding more than once a month. How is this diagnosed? This condition is diagnosed based on your symptoms and a physical exam. You may have tests, such as:  Blood tests and urine tests. These may be done to help rule out other possible causes of your  symptoms.  Ultrasound, to look for abnormal tissues.  An X-ray of the lower bowel (barium enema).  An ultrasound that is done through the vagina (transvaginally).  CT scan.  MRI.  Laparoscopy. In this procedure, a lighted, pencil-sized instrument called a laparoscope is inserted into your abdomen through an incision. The laparoscope allows your health care provider to look at the organs inside your body and check for abnormal tissue to confirm the diagnosis. If abnormal tissue is found, your health care provider may remove a small piece of tissue (biopsy) to be examined under a microscope. How is this treated? Treatment for this condition may include:  Medicines to relieve pain, such as NSAIDs.  Hormone therapy. This involves using artificial (synthetic) hormones to reduce endometrial tissue growth. Your health care provider may recommend using a hormonal form of birth control, or other medicines.  Surgery. This may be done to remove abnormal endometrial tissue.  In some cases, tissue may be removed using a laparoscope and a laser (laparoscopic laser treatment).  In severe cases, surgery may be done to remove the fallopian tubes, uterus, and ovaries (hysterectomy). Follow these instructions at home:  Take over-the-counter and prescription medicines only as told by your health care provider.  Do not drive or use heavy machinery while taking prescription pain medicine.  Try to avoid activities that cause pain, including sexual activity.  Keep all follow-up visits as told by your health care provider. This is important. Contact a health care provider if:  You have  pain in the area between your hip bones (pelvic area) that occurs:  Before, during, or after your period.  In between your period and gets worse during your period.  During or after sex.  With bowel movements or urination, especially during your period.  You have problems getting pregnant.  You have a  fever. Get help right away if:  You have severe pain that does not get better with medicine.  You have severe nausea and vomiting, or you cannot eat without vomiting.  You have pain that affects only the lower, right side of your abdomen.  You have abdominal pain that gets worse.  You have abdominal swelling.  You have blood in your stool. This information is not intended to replace advice given to you by your health care provider. Make sure you discuss any questions you have with your health care provider. Document Released: 09/09/2000 Document Revised: 06/17/2016 Document Reviewed: 02/13/2016 Elsevier Interactive Patient Education  2017 [employer].

## 2017-02-15 NOTE — Telephone Encounter (Signed)
Gae Dry, MD  P Ws-Admin        Appt Dr Glennon Mac in one week for Hospital/ER follow up. Let her know or Hospital know where she is being discharged today ([phone])    Sarah, From [facility] is Calling to schedule follow up fro pt. Pt prefers to see Dr. Kenton Kingfisher on 02/24/17.

## 2017-02-15 NOTE — Telephone Encounter (Signed)
-----   Message from Gae Dry, MD sent at 02/15/2017  9:45 AM EDT ----- Regarding: Appt Glennon Mac Appt Dr Glennon Mac in one week for Hospital/ER follow up.  Let her know or Hospital know where she is being discharged today [phone])

## 2017-02-15 NOTE — Progress Notes (Signed)
Patient is to be discharged home today. Patient is in no acute distress at this time, and assessment is unchanged from this morning. Patient's IV is out, discharge paperwork has been discussed with patient/family and there are no questions or concerns at this time. Follow-up appointment has been scheduled with Dr. Kenton Kingfisher for 02/24/17 at 11:30AM per pts request. Patient will be accompanied downstairs by staff and family via wheelchair.

## 2017-02-24 ENCOUNTER — Ambulatory Visit (INDEPENDENT_AMBULATORY_CARE_PROVIDER_SITE_OTHER): Payer: Self-pay | Admitting: Obstetrics & Gynecology

## 2017-02-24 ENCOUNTER — Encounter: Payer: Self-pay | Admitting: Obstetrics & Gynecology

## 2017-02-24 VITALS — BP 120/80 | HR 82 | Ht 67.0 in | Wt 155.0 lb

## 2017-02-24 DIAGNOSIS — R1031 Right lower quadrant pain: Secondary | ICD-10-CM

## 2017-02-24 DIAGNOSIS — N80129 Deep endometriosis of ovary, unspecified ovary: Secondary | ICD-10-CM

## 2017-02-24 DIAGNOSIS — N809 Endometriosis, unspecified: Secondary | ICD-10-CM

## 2017-02-24 NOTE — Progress Notes (Signed)
Gynecology Pelvic Pain Evaluation   Chief Complaint: RLQ pain, Right pelvic cystic mass  History of Present Illness:   Patient is a 41 y.o. G1P1; Patient's last menstrual period was 02/09/2017., presents today for a problem visit.  She complains of pain.   Her pain is localized to the RLQ area, described as sharp and stabbing, began 02/12/17 when she went to hospital; resolved somewhat since that time; was worse w recent period last week and its severity is described as moderate. The pain radiates to the  back. She has these associated symptoms which include fatigue. Patient has these modifiers which include pain medication that make it better and unable to associate with any factor that make it worse.  Context includes: spontaneous.  She notes prolonged periods nowadays with spotting most of month as well.. She had presented to [facility] with acute right lower quadrant pain on admission, with initial findings suggestive of TOA and so treated with ABX.  Pt has had no fever, WBC elevation, or pos culture. Subsequent [place] more suggestive of 7 cm endometrioma.   PMHx: She  has no past medical history on file. Also,  has no past surgical history on file., family history includes Cancer in her maternal uncle.,  reports that she has been smoking Cigarettes.  She has never used smokeless tobacco. She reports that she does not drink alcohol or use drugs.  She has a current medication list which includes the following prescription(s): doxycycline, naproxen sodium, and oxycodone-acetaminophen. Also, has No Known Allergies.  Review of Systems  Constitutional: Negative for chills, fever and malaise/fatigue.  HENT: Negative for congestion, sinus pain and sore throat.   Eyes: Negative for blurred vision and pain.  Respiratory: Negative for cough and wheezing.   Cardiovascular: Negative for chest pain and leg swelling.  Gastrointestinal: Negative for abdominal pain, constipation, diarrhea, heartburn, nausea and  vomiting.  Genitourinary: Negative for dysuria, frequency, hematuria and urgency.  Musculoskeletal: Negative for back pain, joint pain, myalgias and neck pain.  Skin: Negative for itching and rash.  Neurological: Negative for dizziness, tremors and weakness.  Endo/Heme/Allergies: Does not bruise/bleed easily.  Psychiatric/Behavioral: Negative for depression. The patient is not nervous/anxious and does not have insomnia.    Objective: BP 120/80   Pulse 82   Ht 5\' 7"  (1.702 m)   Wt 155 lb (70.3 kg)   LMP 02/09/2017 Comment: neg preg test 02/12/17  BMI 24.28 kg/m  Physical Exam  Constitutional: She is oriented to person, place, and time. She appears well-developed and well-nourished. No distress.  Musculoskeletal: Normal range of motion.  Neurological: She is alert and oriented to person, place, and time.  Skin: Skin is warm and dry.  Psychiatric: She has a normal mood and affect.  Vitals reviewed.  Female chaperone present for pelvic portion of the physical exam  Assessment: 41 y.o. G1P1 with Functional: RIGTH OVARIAN CYST and PAIN, Probable ENDOMETRIOMA. No further child bearing desired.  1. Right lower quadrant pain - US Transvaginal Non-OB; Future for follow up, if conservative treatment plan decided upon. - Option of surgery to prevent future torsion or rupture, vs exp mgt discussed.  Wowuld consider hysterectomy at time of surgery due to prolonged bleeding and also due to endometriosis as diagnosis.  Keep left ovary if at all possible. - Cyst 7 cm, in need of follow up, risk of torsion discussed - Work note provided  2. Endometrioma - US Transvaginal Non-OB; Future  Barnett Applebaum, MD, [facility], [place]  02/24/2017  11:55 AM

## 2017-02-24 NOTE — Patient Instructions (Signed)
Endometriosis Endometriosis is a condition in which the tissue that lines the uterus (endometrium) grows outside of its normal location. The tissue may grow in many locations close to the uterus, but it commonly grows on the ovaries, fallopian tubes, vagina, or bowel. When the uterus sheds the endometrium every menstrual cycle, there is bleeding wherever the endometrial tissue is located. This can cause pain because blood is irritating to tissues that are not normally exposed to it. What are the causes? The cause of endometriosis is not known. What increases the risk? You may be more likely to develop endometriosis if you:  Have a family history of endometriosis.  Have never given birth.  Started your period at age 10 or younger.  Have high levels of estrogen in your body.  Were exposed to a certain medicine (diethylstilbestrol) before you were born (in utero).  Had low birth weight.  Were born as a twin, triplet, or other multiple.  Have a BMI of less than 25. BMI is an estimate of body fat and is calculated from height and weight.  What are the signs or symptoms? Often, there are no symptoms of this condition. If you do have symptoms, they may:  Vary depending on where your endometrial tissue is growing.  Occur during your menstrual period (most common) or midcycle.  Come and go, or you may go months with no symptoms at all.  Stop with menopause.  Symptoms may include:  Pain in the back or abdomen.  Heavier bleeding during periods.  Pain during sex.  Painful bowel movements.  Infertility.  Pelvic pain.  Bleeding more than once a month.  How is this diagnosed? This condition is diagnosed based on your symptoms and a physical exam. You may have tests, such as:  Blood tests and urine tests. These may be done to help rule out other possible causes of your symptoms.  Ultrasound, to look for abnormal tissues.  An X-ray of the lower bowel (barium enema).  An  ultrasound that is done through the vagina (transvaginally).  CT scan.  MRI.  Laparoscopy. In this procedure, a lighted, pencil-sized instrument called a laparoscope is inserted into your abdomen through an incision. The laparoscope allows your health care provider to look at the organs inside your body and check for abnormal tissue to confirm the diagnosis. If abnormal tissue is found, your health care provider may remove a small piece of tissue (biopsy) to be examined under a microscope.  How is this treated? Treatment for this condition may include:  Medicines to relieve pain, such as NSAIDs.  Hormone therapy. This involves using artificial (synthetic) hormones to reduce endometrial tissue growth. Your health care provider may recommend using a hormonal form of birth control, or other medicines.  Surgery. This may be done to remove abnormal endometrial tissue. ? In some cases, tissue may be removed using a laparoscope and a laser (laparoscopic laser treatment). ? In severe cases, surgery may be done to remove the fallopian tubes, uterus, and ovaries (hysterectomy).  Follow these instructions at home:  Take over-the-counter and prescription medicines only as told by your health care provider.  Do not drive or use heavy machinery while taking prescription pain medicine.  Try to avoid activities that cause pain, including sexual activity.  Keep all follow-up visits as told by your health care provider. This is important. Contact a health care provider if:  You have pain in the area between your hip bones (pelvic area) that occurs: ? Before, during, or   after your period. ? In between your period and gets worse during your period. ? During or after sex. ? With bowel movements or urination, especially during your period.  You have problems getting pregnant.  You have a fever. Get help right away if:  You have severe pain that does not get better with medicine.  You have severe  nausea and vomiting, or you cannot eat without vomiting.  You have pain that affects only the lower, right side of your abdomen.  You have abdominal pain that gets worse.  You have abdominal swelling.  You have blood in your stool. This information is not intended to replace advice given to you by your health care provider. Make sure you discuss any questions you have with your health care provider. Document Released: 09/09/2000 Document Revised: 06/17/2016 Document Reviewed: 02/13/2016 Elsevier Interactive Patient Education  2018 [employer].  Total Laparoscopic Hysterectomy A total laparoscopic hysterectomy is a minimally invasive surgery to remove your uterus and cervix. This surgery is performed by making several small cuts (incisions) in your abdomen. It can also be done with a thin, lighted tube (laparoscope) inserted into two small incisions in your lower abdomen. Your fallopian tubes and ovaries can be removed (bilateral salpingo-oophorectomy) during this surgery as well.Benefits of minimally invasive surgery include:  Less pain.  Less risk of blood loss.  Less risk of infection.  Quicker return to normal activities.  Tell a health care provider about:  Any allergies you have.  All medicines you are taking, including vitamins, herbs, eye drops, creams, and over-the-counter medicines.  Any problems you or family members have had with anesthetic medicines.  Any blood disorders you have.  Any surgeries you have had.  Any medical conditions you have. What are the risks? Generally, this is a safe procedure. However, as with any procedure, complications can occur. Possible complications include:  Bleeding.  Blood clots in the legs or lung.  Infection.  Injury to surrounding organs.  Problems with anesthesia.  Early menopause symptoms (hot flashes, night sweats, insomnia).  Risk of conversion to an open abdominal incision.  What happens before the  procedure?  Ask your health care provider about changing or stopping your regular medicines.  Do not take aspirin or blood thinners (anticoagulants) for 1 week before the surgery or as told by your health care provider.  Do not eat or drink anything for 8 hours before the surgery or as told by your health care provider.  Quit smoking if you smoke.  Arrange for a ride home after surgery and for someone to help you at home during recovery. What happens during the procedure?  You will be given antibiotic medicine.  An IV tube will be placed in your arm. You will be given medicine to make you sleep (general anesthetic).  A gas (carbon dioxide) will be used to inflate your abdomen. This will allow your surgeon to look inside your abdomen, perform your surgery, and treat any other problems found if necessary.  Three or four small incisions (often less than 1/2 inch) will be made in your abdomen. One of these incisions will be made in the area of your belly button (navel). The laparoscope will be inserted into the incision. Your surgeon will look through the laparoscope while doing your procedure.  Other surgical instruments will be inserted through the other incisions.  Your uterus may be removed through your vagina or cut into small pieces and removed through the small incisions.  Your incisions will  be closed. What happens after the procedure?  The gas will be released from inside your abdomen.  You will be taken to the recovery area where a nurse will watch and check your progress. Once you are awake, stable, and taking fluids well, without other problems, you will return to your room or be allowed to go home.  There is usually minimal discomfort following the surgery because the incisions are so small.  You will be given pain medicine while you are in the hospital and for when you go home. This information is not intended to replace advice given to you by your health care provider.  Make sure you discuss any questions you have with your health care provider. Document Released: 07/10/2007 Document Revised: 02/18/2016 Document Reviewed: 04/02/2013 Elsevier Interactive Patient Education  2017 [employer].

## 2017-03-27 ENCOUNTER — Other Ambulatory Visit: Payer: MEDICAID

## 2017-03-27 ENCOUNTER — Ambulatory Visit: Payer: Self-pay | Admitting: Obstetrics & Gynecology

## 2017-10-10 ENCOUNTER — Emergency Department
Admission: EM | Admit: 2017-10-10 | Discharge: 2017-10-10 | Disposition: A | Discharge status: Home / Self Care | Payer: Self-pay | Attending: Emergency Medicine | Admitting: Emergency Medicine

## 2017-10-10 ENCOUNTER — Encounter: Payer: Self-pay | Admitting: Emergency Medicine

## 2017-10-10 DIAGNOSIS — Y33XXXA Other specified events, undetermined intent, initial encounter: Secondary | ICD-10-CM | POA: Insufficient documentation

## 2017-10-10 DIAGNOSIS — Y929 Unspecified place or not applicable: Secondary | ICD-10-CM | POA: Insufficient documentation

## 2017-10-10 DIAGNOSIS — F17210 Nicotine dependence, cigarettes, uncomplicated: Secondary | ICD-10-CM | POA: Insufficient documentation

## 2017-10-10 DIAGNOSIS — S025XXB Fracture of tooth (traumatic), initial encounter for open fracture: Secondary | ICD-10-CM

## 2017-10-10 DIAGNOSIS — Y998 Other external cause status: Secondary | ICD-10-CM | POA: Insufficient documentation

## 2017-10-10 DIAGNOSIS — K047 Periapical abscess without sinus: Secondary | ICD-10-CM | POA: Insufficient documentation

## 2017-10-10 DIAGNOSIS — Y9389 Activity, other specified: Secondary | ICD-10-CM | POA: Insufficient documentation

## 2017-10-10 DIAGNOSIS — S025XXA Fracture of tooth (traumatic), initial encounter for closed fracture: Secondary | ICD-10-CM | POA: Insufficient documentation

## 2017-10-10 MED ORDER — KETOROLAC TROMETHAMINE 30 MG/ML IJ SOLN
30.0000 mg | Freq: Once | INTRAMUSCULAR | Status: AC
  Administered 2017-10-10: 30 mg via INTRAMUSCULAR
  Filled 2017-10-10: qty 1

## 2017-10-10 MED ORDER — AMOXICILLIN 500 MG PO CAPS: 500.0000 mg | ORAL_CAPSULE | Freq: Three times a day (TID) | ORAL | 0 refills | Status: DC

## 2017-10-10 MED ORDER — AMOXICILLIN 500 MG PO CAPS
500.0000 mg | ORAL_CAPSULE | Freq: Once | ORAL | Status: AC
  Administered 2017-10-10: 500 mg via ORAL
  Filled 2017-10-10: qty 1

## 2017-10-10 MED ORDER — LIDOCAINE VISCOUS 2 % MT SOLN: 15.0000 mL | Freq: Once | OROMUCOSAL | Status: DC

## 2017-10-10 MED ORDER — LIDOCAINE VISCOUS 2 % MT SOLN
10.0000 mL | OROMUCOSAL | 0 refills | Status: DC | PRN
Start: 2017-10-10 — End: 2017-12-04

## 2017-10-10 NOTE — ED Triage Notes (Signed)
Dental pain and swelling that began 2 days ago.

## 2017-10-10 NOTE — ED Provider Notes (Signed)
[facility] Emergency Department Provider Note  ____________________________________________  Time seen: Approximately 10:26 AM  I have reviewed the triage vital signs and the nursing notes.   HISTORY  Chief Complaint Dental Pain    HPI Holly Vega is a 42 y.o. female that presents to the emergency department for evaluation of right lower dental pain for 2 days.  Tooth developed a crack it after she was eating a salad with cranberries in it.  She had a low-grade fever yesterday.  Last visit to the dentist was when she had a tooth removed about 1 year ago.  No drainage from site, shortness of breath, chest pain, nausea, vomiting.   History reviewed. No pertinent past medical history.  Patient Active Problem List   Diagnosis Date Noted  . Right lower quadrant pain 02/24/2017  . Endometrioma 02/15/2017  . Tubo-ovarian abscess 02/12/2017  . Right pyosalpinx 02/12/2017    History reviewed. No pertinent surgical history.  Prior to Admission medications   Medication Sig Start Date End Date Taking? Authorizing Provider  amoxicillin (AMOXIL) 500 MG capsule Take 1 capsule (500 mg total) by mouth 3 (three) times daily. 10/10/17   Laban Emperor, PA-C  doxycycline (VIBRA-TABS) 100 MG tablet Take 1 tablet (100 mg total) by mouth every 12 (twelve) hours. 02/15/17   Gae Dry, MD  lidocaine (XYLOCAINE) 2 % solution Use as directed 10 mLs in the mouth or throat as needed for mouth pain. 10/10/17   Laban Emperor, PA-C  naproxen sodium (ANAPROX) 220 MG tablet Take 220 mg by mouth 2 (two) times daily with a meal.    [provider]  oxyCODONE-acetaminophen (PERCOCET/ROXICET) 5-325 MG tablet Take 1-2 tablets by mouth every 6 (six) hours as needed. 02/15/17   Gae Dry, MD    Allergies Patient has no known allergies.  Family History  Problem Relation Age of Onset  . Cancer Maternal Uncle     Social History Social History   Tobacco Use  .  Smoking status: Current Every Day Smoker    Types: Cigarettes  . Smokeless tobacco: Never Used  Substance Use Topics  . Alcohol use: No  . Drug use: No     Review of Systems  Cardiovascular: No chest pain. Respiratory: No SOB. Gastrointestinal: No abdominal pain.  No nausea, no vomiting.  Skin: Negative for rash, abrasions, lacerations, ecchymosis.   ____________________________________________   PHYSICAL EXAM:  VITAL SIGNS: ED Triage Vitals  Enc Vitals Group     BP 10/10/17 1002 132/70     Pulse Rate 10/10/17 1002 92     Resp 10/10/17 1002 18     Temp 10/10/17 1002 98.4 F (36.9 C)     Temp Source 10/10/17 1002 Oral     SpO2 10/10/17 1002 98 %     Weight 10/10/17 1003 149 lb (67.6 kg)     Height 10/10/17 1003 5\' 7"  (1.702 m)     Head Circumference --      Peak Flow --      Pain Score 10/10/17 1001 10     Pain Loc --      Pain Edu? --      Excl. in [place]? --      Constitutional: Alert and oriented. Well appearing and in no acute distress. Eyes: Conjunctivae are normal. PERRL. EOMI. Head: Atraumatic. ENT:      Ears:      Nose: No congestion/rhinnorhea.      Mouth/Throat: Mucous membranes are moist.  Tenderness to  palpation over right bottom premolar.  No drainage from site.  No visible swelling. Neck: No stridor. Cardiovascular: Normal rate, regular rhythm.  Good peripheral circulation. Respiratory: Normal respiratory effort without tachypnea or retractions. Lungs CTAB. Good air entry to the bases with no decreased or absent breath sounds. Musculoskeletal: Full range of motion to all extremities. No gross deformities appreciated. Neurologic:  Normal speech and language. No gross focal neurologic deficits are appreciated.  Skin:  Skin is warm, dry and intact. No rash noted.   ____________________________________________   LABS (all labs ordered are listed, but only abnormal results are displayed)  Labs Reviewed - No data to  display ____________________________________________  EKG   ____________________________________________  RADIOLOGY  No results found.  ____________________________________________    PROCEDURES  Procedure(s) performed:    Procedures    Medications  ketorolac (TORADOL) 30 MG/ML injection 30 mg (30 mg Intramuscular Given 10/10/17 1041)  amoxicillin (AMOXIL) capsule 500 mg (500 mg Oral Given 10/10/17 1040)     ____________________________________________   INITIAL IMPRESSION / ASSESSMENT AND PLAN / ED COURSE  Pertinent labs & imaging results that were available during my care of the patient were reviewed by me and considered in my medical decision making (see chart for details).  Review of the [place] CSRS was performed in accordance of the [place] prior to dispensing any controlled drugs.   Patient's diagnosis is consistent with dental abscess.  Vital signs and exam are reassuring.  Patient was given Toradol for pain and first dose of amoxicillin in ED.  Patient will be discharged home with prescriptions for amoxicillin.  Dental resources were provided.  Patient is to follow up with dentist as directed. Patient is given ED precautions to return to the ED for any worsening or new symptoms.     ____________________________________________  FINAL CLINICAL IMPRESSION(S) / ED DIAGNOSES  Final diagnoses:  Dental abscess  Open fracture of tooth, initial encounter      NEW MEDICATIONS STARTED DURING THIS VISIT:  ED Discharge Orders        Ordered    amoxicillin (AMOXIL) 500 MG capsule  3 times daily     10/10/17 1104    lidocaine (XYLOCAINE) 2 % solution  As needed     10/10/17 1112          This chart was dictated using voice recognition software/Dragon. Despite best efforts to proofread, errors can occur which can change the meaning. Any change was purely unintentional.    Laban Emperor, PA-C 10/10/17 [place], Randall An, MD 10/10/17  [phone]

## 2017-10-10 NOTE — Discharge Instructions (Signed)
OPTIONS FOR DENTAL FOLLOW UP CARE ° °[place] Department of Health and Human Services - Local Safety Net Dental Clinics °http://www.ncdhhs.gov/dph/oralhealth/services/safetynetclinics.htm °  °[facility] (336-562-3123) ° °[facility] [place] ([phone]) ° °[facility] ([phone] ext [phone]) ° °[facility] ([phone]) ° °[facility] ([phone]) °This clinic caters to the indigent population and is on a lottery system. °Location: °[facility], [address], [place] °Clinic Hours: °Wednesdays from 6pm - 9pm, patients seen by a lottery system. °For dates, call or go to www.med.[facility].edu/[facility]/patients/Dental-[facility] °Services: °Cleanings, fillings and simple extractions. °Payment Options: °DENTAL WORK IS FREE OF CHARGE. Bring proof of income or support. °Best way to get seen: °Arrive at 5:15 pm - this is a lottery, NOT first come/first serve, so arriving earlier will not increase your chances of being seen. °  °  °[facility] °[phone] °Select option 1 for emergencies °  °Location: °[facility], [address], [place] °Clinic Hours: °No walk-ins accepted - call the day before to schedule an appointment. °Check in times are 9:30 am and 1:30 pm. °Services: °Simple extractions, temporary fillings, pulpectomy/pulp debridement, uncomplicated abscess drainage. °Payment Options: °PAYMENT IS DUE AT THE TIME OF SERVICE.  Fee is usually $100-200, additional surgical procedures (e.g. abscess drainage) may be extra. °Cash, checks, Visa/MasterCard accepted.  Can file Medicaid if patient is covered for dental - patient should call case worker to check. °No discount for [facility] patients. °Best way to get seen: °MUST call the day before and get onto the schedule. Can usually be seen the next 1-2 days. No walk-ins accepted. °  °  °[facility] °[phone] °   °Location: °[facility], [address], [place] °Clinic Hours: °M, W, Th, F 8am or 1:30pm, Tues 9a or 1:30 - first come/first served. °Services: °Simple extractions, temporary fillings, uncomplicated abscess drainage.  You do not need to be an [place] resident. °Payment Options: °PAYMENT IS DUE AT THE TIME OF SERVICE. °Dental insurance, otherwise sliding scale - bring proof of income or support. °Depending on income and treatment needed, cost is usually $50-200. °Best way to get seen: °Arrive early as it is first come/first served. °  °  °[facility] °[phone] °  °Location: °[address] °Clinic Hours: °Mon-Thu 8a-5p °Services: °Most basic dental services including extractions and fillings. °Payment Options: °PAYMENT IS DUE AT THE TIME OF SERVICE. °Sliding scale, up to 50% off - bring proof if income or support. °Medicaid with dental option accepted. °Best way to get seen: °Call to schedule an appointment, can usually be seen within 2 weeks OR they will try to see walk-ins - show up at 8a or 2p (you may have to wait). °  °  °[facility] °[phone] °[place] RESIDENTS ONLY °  °Location: °[facility], [address], [place], [place] [address] °Clinic Hours: By appointment only. °Monday - Thursday 8am-5pm, Friday 8am-12pm °Services: Cleanings, fillings, extractions. °Payment Options: °PAYMENT IS DUE AT THE TIME OF SERVICE. °Cash, Visa or MasterCard. Sliding scale - $30 minimum per service. °Best way to get seen: °Come in to office, complete packet and make an appointment - need proof of income °or support monies for each household member and proof of [place] residence. °Usually takes about a month to get in. °  °  °[facility] °[phone] °  °Location: °[address]   [place] °Clinic Hours: Walk-in Urgent Care Dental Services are offered Monday-Friday  mornings only. °The numbers of emergencies accepted daily is limited to the number of °providers available. °Maximum 15 - Mondays, Wednesdays & Thursdays °Maximum 10 - Tuesdays & Fridays °Services: °You do not need to be a [place] resident to be seen for a dental emergency. °Emergencies are defined as pain, swelling, abnormal bleeding, or dental trauma. Walkins will receive x-rays if needed. °NOTE: Dental cleaning is not an emergency. °Payment Options: °PAYMENT IS DUE AT THE TIME OF SERVICE. °Minimum co-pay is $40.00 for uninsured patients. °Minimum co-pay is $3.00 for Medicaid with dental coverage. °Dental Insurance is accepted and must be presented at time of visit. °Medicare does not cover dental. °Forms of payment: Cash, credit card, checks. °Best way to get seen: °If not previously registered with the clinic, walk-in dental registration begins at 7:15 am and is on a first come/first serve basis. °If previously registered with the clinic, call to make an appointment. °  °  °[facility] °[phone] °[place] RESIDENTS ONLY °  °Location: °[address], [place], [place] °Clinic Hours: °Mon-Thu 10a-2p °Services: Extractions only! °Payment Options: °FREE (donations accepted) - bring proof of income or support °Best way to get seen: °Call and schedule an appointment OR come at 8am on the 1st Monday of every month (except for holidays) when it is first come/first served. °  °  °[facility] °[phone] °  °Location: °[address], [place] °Clinic Hours: °Friday mornings °Services, Payment Options, Best way to get seen: °Call for info °

## 2017-12-04 ENCOUNTER — Emergency Department: Payer: Self-pay

## 2017-12-04 ENCOUNTER — Other Ambulatory Visit: Payer: Self-pay

## 2017-12-04 ENCOUNTER — Emergency Department
Admission: EM | Admit: 2017-12-04 | Discharge: 2017-12-04 | Disposition: A | Discharge status: Home / Self Care | Payer: Self-pay | Attending: Emergency Medicine | Admitting: Emergency Medicine

## 2017-12-04 DIAGNOSIS — F17210 Nicotine dependence, cigarettes, uncomplicated: Secondary | ICD-10-CM | POA: Insufficient documentation

## 2017-12-04 DIAGNOSIS — N7011 Chronic salpingitis: Secondary | ICD-10-CM | POA: Insufficient documentation

## 2017-12-04 DIAGNOSIS — R102 Pelvic and perineal pain: Secondary | ICD-10-CM

## 2017-12-04 LAB — WET PREP, GENITAL
Clue Cells Wet Prep HPF POC: NONE SEEN
Sperm: NONE SEEN
Trich, Wet Prep: NONE SEEN
Yeast Wet Prep HPF POC: NONE SEEN

## 2017-12-04 LAB — URINALYSIS, COMPLETE (UACMP) WITH MICROSCOPIC
BACTERIA UA: NONE SEEN
Bilirubin Urine: NEGATIVE
Glucose, UA: NEGATIVE mg/dL
Ketones, ur: NEGATIVE mg/dL
Leukocytes, UA: NEGATIVE
Nitrite: NEGATIVE
Protein, ur: NEGATIVE mg/dL
SPECIFIC GRAVITY, URINE: 1.012 (ref 1.005–1.030)
pH: 6 (ref 5.0–8.0)

## 2017-12-04 LAB — COMPREHENSIVE METABOLIC PANEL
ALT: 9 U/L — ABNORMAL LOW (ref 14–54)
AST: 15 U/L (ref 15–41)
Albumin: 4.2 g/dL (ref 3.5–5.0)
Alkaline Phosphatase: 67 U/L (ref 38–126)
Anion gap: 7 (ref 5–15)
BILIRUBIN TOTAL: 0.5 mg/dL (ref 0.3–1.2)
BUN: 11 mg/dL (ref 6–20)
CO2: 28 mmol/L (ref 22–32)
Calcium: 9 mg/dL (ref 8.9–10.3)
Chloride: 104 mmol/L (ref 101–111)
Creatinine, Ser: 0.75 mg/dL (ref 0.44–1.00)
GFR calc Af Amer: >60 mL/min (ref >60)
GFR calc non Af Amer: >60 mL/min (ref >60)
GLUCOSE: 118 mg/dL — AB (ref 65–99)
POTASSIUM: 3.8 mmol/L (ref 3.5–5.1)
Sodium: 139 mmol/L (ref 135–145)
TOTAL PROTEIN: 7.5 g/dL (ref 6.5–8.1)

## 2017-12-04 LAB — CBC
HEMATOCRIT: 40.7 % (ref 35.0–47.0)
Hemoglobin: 13.5 g/dL (ref 12.0–16.0)
MCH: 29.7 pg (ref 26.0–34.0)
MCHC: 33.2 g/dL (ref 32.0–36.0)
MCV: 89.3 fL (ref 80.0–100.0)
Platelets: 297 10*3/uL (ref 150–440)
RBC: 4.56 MIL/uL (ref 3.80–5.20)
RDW: 15.5 % — AB (ref 11.5–14.5)
WBC: 12.5 10*3/uL — ABNORMAL HIGH (ref 3.6–11.0)

## 2017-12-04 LAB — CHLAMYDIA/NGC RT PCR (ARMC ONLY)
CHLAMYDIA TR: NOT DETECTED
N GONORRHOEAE: NOT DETECTED

## 2017-12-04 LAB — PREGNANCY, URINE: PREG TEST UR: NEGATIVE

## 2017-12-04 LAB — LIPASE, BLOOD: Lipase: 27 U/L (ref 11–51)

## 2017-12-04 LAB — POCT PREGNANCY, URINE: Preg Test, Ur: NEGATIVE

## 2017-12-04 MED ORDER — ACETAMINOPHEN 500 MG PO TABS
ORAL_TABLET | ORAL | Status: AC
  Filled 2017-12-04: qty 2

## 2017-12-04 MED ORDER — ACETAMINOPHEN 500 MG PO TABS
1000.0000 mg | ORAL_TABLET | Freq: Once | ORAL | Status: AC
  Administered 2017-12-04: 1000 mg via ORAL

## 2017-12-04 MED ORDER — OXYCODONE-ACETAMINOPHEN 5-325 MG PO TABS
2.0000 | ORAL_TABLET | Freq: Once | ORAL | Status: DC
  Filled 2017-12-04: qty 2

## 2017-12-04 MED ORDER — NAPROXEN 500 MG PO TABS: 500.0000 mg | ORAL_TABLET | Freq: Two times a day (BID) | ORAL | 2 refills | Status: DC

## 2017-12-04 MED ORDER — OXYCODONE-ACETAMINOPHEN 7.5-325 MG PO TABS: 1.0000 | ORAL_TABLET | ORAL | 0 refills | Status: DC | PRN

## 2017-12-04 MED ORDER — DOXYCYCLINE HYCLATE 100 MG PO CAPS: 100.0000 mg | ORAL_CAPSULE | Freq: Two times a day (BID) | ORAL | 0 refills | Status: DC

## 2017-12-04 NOTE — ED Notes (Signed)
Patient refusing percocet at this time, states she would prefer tylenol.

## 2017-12-04 NOTE — ED Triage Notes (Signed)
Pt reports waking around 11pm from pain to right lower quadrant around; pt says pain has moved across abd; nausea but no vomiting; denies diarrhea; some pressure to low midline abd when voiding;

## 2017-12-04 NOTE — ED Notes (Signed)
Pt was unable to provide urine sample./ pt was informed to please provide sample when able.

## 2017-12-04 NOTE — ED Provider Notes (Signed)
[facility] Emergency Department Provider Note       Time seen: ----------------------------------------- 7:11 AM on 12/04/2017 -----------------------------------------   I have reviewed the triage vital signs and the nursing notes.  HISTORY   Chief Complaint Abdominal Pain    HPI Holly Vega is a 42 y.o. female with a history of TOA who presents to the ED for right lower quadrant pain that started around 11 PM last night.  Patient states the pain moves across the abdomen.  She said nausea but no vomiting, denies any diarrhea.  She has had pressure to the abdomen when voiding.  Discomfort is 5 out of 10.  No past medical history on file.  Patient Active Problem List   Diagnosis Date Noted  . Right lower quadrant pain 02/24/2017  . Endometrioma 02/15/2017  . Tubo-ovarian abscess 02/12/2017  . Right pyosalpinx 02/12/2017    No past surgical history on file.  Allergies Patient has no known allergies.  Social History Social History   Tobacco Use  . Smoking status: Current Every Day Smoker    Types: Cigarettes  . Smokeless tobacco: Never Used  Substance Use Topics  . Alcohol use: No  . Drug use: No   Review of Systems Constitutional: Negative for fever. Cardiovascular: Negative for chest pain. Respiratory: Negative for shortness of breath. Gastrointestinal: Positive for abdominal pain, nausea Genitourinary: Negative for dysuria. Musculoskeletal: Negative for back pain. Skin: Negative for rash. Neurological: Negative for headaches, focal weakness or numbness.  All systems negative/normal/unremarkable except as stated in the HPI  ____________________________________________   PHYSICAL EXAM:  VITAL SIGNS: ED Triage Vitals  Enc Vitals Group     BP 12/04/17 0201 120/74     Pulse Rate 12/04/17 0201 70     Resp 12/04/17 0201 18     Temp 12/04/17 0201 98.6 F (37 C)     Temp Source 12/04/17 0201 Oral     SpO2 12/04/17 0201 100 %     Weight 12/04/17 0201 149 lb (67.6 kg)     Height 12/04/17 0201 5\' 7"  (1.702 m)     Head Circumference --      Peak Flow --      Pain Score 12/04/17 0211 7     Pain Loc --      Pain Edu? --      Excl. in [place]? --    Constitutional: Alert and oriented. Well appearing and in no distress. Eyes: Conjunctivae are normal. Normal extraocular movements. ENT   Head: Normocephalic and atraumatic.   Nose: No congestion/rhinnorhea.   Mouth/Throat: Mucous membranes are moist.   Neck: No stridor. Cardiovascular: Normal rate, regular rhythm. No murmurs, rubs, or gallops. Respiratory: Normal respiratory effort without tachypnea nor retractions. Breath sounds are clear and equal bilaterally. No wheezes/rales/rhonchi. Gastrointestinal: Right lower quadrant tenderness, no rebound or guarding.  Normal bowel sounds. Genitourinary: Diffuse pelvic tenderness, worse in the middle and right Musculoskeletal: Nontender with normal range of motion in extremities. No lower extremity tenderness nor edema. Neurologic:  Normal speech and language. No gross focal neurologic deficits are appreciated.  Skin:  Skin is warm, dry and intact. No rash noted. Psychiatric: Mood and affect are normal. Speech and behavior are normal.  ____________________________________________  ED COURSE:  As part of my medical decision making, I reviewed the following data within the [place] History obtained from family if available, nursing notes, old chart and ekg, as well as notes from prior ED visits. Patient presented for abdominal pain,  we will assess with labs and imaging as indicated at this time.   Procedures ____________________________________________   LABS (pertinent positives/negatives)  Labs Reviewed  WET PREP, GENITAL - Abnormal; Notable for the following components:      Result Value   WBC, Wet Prep HPF POC RARE (*)    All other components within normal limits  COMPREHENSIVE METABOLIC  PANEL - Abnormal; Notable for the following components:   Glucose, Bld 118 (*)    ALT 9 (*)    All other components within normal limits  CBC - Abnormal; Notable for the following components:   WBC 12.5 (*)    RDW 15.5 (*)    All other components within normal limits  URINALYSIS, COMPLETE (UACMP) WITH MICROSCOPIC - Abnormal; Notable for the following components:   Color, Urine YELLOW (*)    APPearance HAZY (*)    Hgb urine dipstick SMALL (*)    Squamous Epithelial / LPF 0-5 (*)    All other components within normal limits  CHLAMYDIA/NGC RT PCR ([facility] ONLY)  LIPASE, BLOOD  PREGNANCY, URINE  POCT PREGNANCY, URINE    RADIOLOGY  Pelvic ultrasound IMPRESSION: 8.1 cm homogeneous low-density mass in the right adnexa with low level internal echoes, most compatible with endometrioma.  Right hydrosalpinx.  Small cysts or follicles in the left ovary, some of which appear hemorrhagic.  No evidence of ovarian torsion. ____________________________________________  DIFFERENTIAL DIAGNOSIS   PID, TOA, fibroid uterus, cystitis, renal colic, appendicitis  FINAL ASSESSMENT AND PLAN  Right lower quadrant pain, hydrosalpinx   Plan: The patient had presented for right lower quadrant pain. Patient's labs revealed mild leukocytosis but are otherwise unremarkable. Patient's imaging did reveal hydrosalpinx as before.  I did discuss with GYN on call Dr. Glennon Mac who will see her as an outpatient.  She will be discharged with doxycycline, naproxen and Percocet.   Laurence Aly, MD   Note: This note was generated in part or whole with voice recognition software. Voice recognition is usually quite accurate but there are transcription errors that can and very often do occur. I apologize for any typographical errors that were not detected and corrected.     Earleen Newport, MD 12/04/17 1009

## 2017-12-05 ENCOUNTER — Ambulatory Visit (INDEPENDENT_AMBULATORY_CARE_PROVIDER_SITE_OTHER): Payer: Self-pay | Admitting: Obstetrics and Gynecology

## 2017-12-05 ENCOUNTER — Encounter: Payer: Self-pay | Admitting: Obstetrics and Gynecology

## 2017-12-05 VITALS — BP 124/74 | Ht 67.0 in | Wt 152.0 lb

## 2017-12-05 DIAGNOSIS — N7011 Chronic salpingitis: Secondary | ICD-10-CM

## 2017-12-05 DIAGNOSIS — R1031 Right lower quadrant pain: Secondary | ICD-10-CM

## 2017-12-05 DIAGNOSIS — N809 Endometriosis, unspecified: Secondary | ICD-10-CM

## 2017-12-05 DIAGNOSIS — N80129 Deep endometriosis of ovary, unspecified ovary: Secondary | ICD-10-CM

## 2017-12-05 NOTE — Progress Notes (Signed)
Obstetrics & Gynecology Office Visit   Chief Complaint  Patient presents with  . Follow-up    ER Follow Up  Abdominal pain/pelvic pain, endometrioma and hydrosalpinx   History of Present Illness: 42 y.o. [id number] who presents in follow up from an ER visit yesterday for abdominal pain of  Acute onset. Findings consistent with right endometrioma (which has been seen on previous imaging in 01/2017 when the patient had an admission for pain and possible TOA vs endometrioma and right hydrosalpinx). She also had the persistence of a right hydrosalpinx.  She presented to the ER in the middle of the night yesterday for acute onset abdominal pain.  The pain started in the right lower quadrant and seemed to move to her mid-abdomen.  She felt nauseated.  She had a little pain since her hospitalization last year, but not much.  She has regular menses, but does have intermenstrual spotting (brownish-red).   She can think of nothing that made the pain come on.  She took an Aleve, but the pain sort of went away on its own.  She has no pain at this point.  Of note the endometrioma remains unchanged in size and is about 8 cm.    Past Medical History:  Diagnosis Date  . Endometriosis    Past Surgical History: denies  Gynecologic History: Patient's last menstrual period was 11/27/2017 (approximate).  Obstetric History: [id number]  Family History  Problem Relation Age of Onset  . Cancer Maternal Uncle     Social History   Socioeconomic History  . Marital status: Married    Spouse name: Not on file  . Number of children: Not on file  . Years of education: Not on file  . Highest education level: Not on file  Social Needs  . Financial resource strain: Not on file  . Food insecurity - worry: Not on file  . Food insecurity - inability: Not on file  . Transportation needs - medical: Not on file  . Transportation needs - non-medical: Not on file  Occupational History  . Not on file  Tobacco Use  .  Smoking status: Current Every Day Smoker    Types: Cigarettes  . Smokeless tobacco: Never Used  Substance and Sexual Activity  . Alcohol use: No  . Drug use: No  . Sexual activity: Yes    Birth control/protection: None  Other Topics Concern  . Not on file  Social History Narrative  . Not on file   Allergies: No Known Allergies  Medications   Medication Sig Start Date End Date Taking? Authorizing Provider  doxycycline (VIBRAMYCIN) 100 MG capsule Take 1 capsule (100 mg total) by mouth 2 (two) times daily. 12/04/17   Earleen Newport, MD  naproxen (NAPROSYN) 500 MG tablet Take 1 tablet (500 mg total) by mouth 2 (two) times daily with a meal. 12/04/17 12/04/18  Earleen Newport, MD  oxyCODONE-acetaminophen (PERCOCET) 7.5-325 MG tablet Take 1 tablet by mouth every 4 (four) hours as needed for severe pain. 12/04/17 12/04/18  Earleen Newport, MD    Review of Systems  Constitutional: Negative.   HENT: Negative.   Eyes: Negative.   Respiratory: Negative.   Cardiovascular: Negative.   Gastrointestinal: Negative.   Genitourinary: Negative.   Musculoskeletal: Negative.   Skin: Negative.   Neurological: Negative.   Psychiatric/Behavioral: Negative.      Physical Exam BP 124/74   Ht 5\' 7"  (1.702 m)   Wt 152 lb (68.9 kg)   LMP 11/27/2017 (Approximate)  BMI 23.81 kg/m  Patient's last menstrual period was 11/27/2017 (approximate). Physical Exam  Constitutional: She is oriented to person, place, and time. She appears well-developed and well-nourished. No distress.  Eyes: EOM are normal. No scleral icterus.  Neck: Normal range of motion. Neck supple.  Cardiovascular: Normal rate and regular rhythm.  Pulmonary/Chest: Effort normal and breath sounds normal. No respiratory distress. She has no wheezes. She has no rales.  Abdominal: Soft. Bowel sounds are normal. She exhibits no distension and no mass. There is no tenderness. There is no rebound and no guarding.  Musculoskeletal:  Normal range of motion. She exhibits no edema.  Neurological: She is alert and oriented to person, place, and time. No cranial nerve deficit.  Skin: Skin is warm and dry. No erythema.  Psychiatric: She has a normal mood and affect. Her behavior is normal. Judgment normal.   Female chaperone present for pelvic and breast  portions of the physical exam  Assessment: 42 y.o. [id number] female here for  1. Endometrioma   2. Hydrosalpinx   3. Right lower quadrant pain      Plan: Problem List Items Addressed This Visit      Genitourinary   Right pyosalpinx   Endometrioma - Primary     Other   Right lower quadrant pain     Pap smear performed today using [facility] (instead of [facility]). If negative, discussed treatment options of removal of right endometrioma versus right ovary and removal of right fallopian tube. We also discussed hysterectomy with bilateral salpingectomy, and removal of right endometrioma vs right ovary. Final decision will be made once I receive a normal pap smear.    20 minutes spent in face to face discussion with > 50% spent in counseling,management, and coordination of care of her endometrioma, hydrosalpinx, and right lower quadrant abdominal pain/pelvic pain.   Prentice Docker, MD 12/06/2017 12:58 PM

## 2017-12-06 ENCOUNTER — Encounter: Payer: Self-pay | Admitting: Obstetrics and Gynecology

## 2017-12-11 ENCOUNTER — Telehealth: Payer: Self-pay | Admitting: Obstetrics and Gynecology

## 2017-12-11 NOTE — Telephone Encounter (Signed)
Please call patient to let her know that, as we discussed, I will schedule her surgery as soon as I get her pap smear result back, which hasn't happened yet, but should be soon.   Please send this back to me after you've spoken with her.  I expect this to occur within days, not weeks or longer.  Thank you!

## 2017-12-11 NOTE — Telephone Encounter (Signed)
Please send surgery info to nancy so pt can get scheduled. Thank you.

## 2017-12-12 NOTE — Telephone Encounter (Signed)
Pt aware we will call once results of pap are back. fwding back to [facility]

## 2017-12-12 NOTE — Telephone Encounter (Signed)
Late entry: called pt to inform her of SDJ msg. Please let me know when she calls back. No vm box set up

## 2017-12-20 ENCOUNTER — Telehealth: Payer: Self-pay | Admitting: Obstetrics and Gynecology

## 2017-12-20 NOTE — Telephone Encounter (Signed)
Patient is calling due to missed call please advise  

## 2017-12-20 NOTE — Telephone Encounter (Signed)
Attempted to call patient. Unable to leave message due to VM not set up.

## 2017-12-20 NOTE — Telephone Encounter (Signed)
Discussed pap smear results with patient. Normal pap smear and negative HPV. She would like to proceed with surgery. Based on a prior conversation, will proceed with TLH/BS/right oophorectomy and removal of endometrioma, cystoscopy. Will schedule for late April per her request.

## 2017-12-20 NOTE — Telephone Encounter (Signed)
SDJ spoke with patient. fwding back to him

## 2017-12-21 ENCOUNTER — Telehealth: Payer: Self-pay | Admitting: Obstetrics and Gynecology

## 2017-12-21 NOTE — Telephone Encounter (Signed)
-----   Message from Will Bonnet, MD sent at 12/20/2017  3:09 PM EDT ----- Regarding: Schedule surgery Surgery Booking Request Patient Full Name:  Holly Vega  MRN: [id number]  DOB: April 03, 1976  Surgeon: Prentice Docker, MD  Requested Surgery Date and Time: late April (or when available) Primary Diagnosis AND Code: endometrioma, hydrosalpinx, right lower quadrant abdominal pian Secondary Diagnosis and Code:  Surgical Procedure: TLH/BS/right oophorectomy/removal of right ovarian cyst/cystoscopy L&D Notification: No Admission Status: same day surgery Length of Surgery: 2.5 hours Special Case Needs: none H&P: TBD (date) Phone Interview???: no Interpreter: Language:  Medical Clearance: no Special Scheduling Instructions: none

## 2017-12-21 NOTE — Telephone Encounter (Signed)
Patient is aware of H&P at [facility] on 01/15/18 @ 9:50am w/ Dr. Glennon Mac, Pre-admit Testing afterwards, and OR on 01/25/18. Patient is aware she may receive calls from the [facility]. Patient does not have insurance and was given phone#s  1) [phone] [facility] Patient [place], 2) [phone] to apply for financial assistance w/in [facility] practices, and 3) [phone] [employer]. My phone# and ext were given.

## 2018-01-15 ENCOUNTER — Ambulatory Visit (INDEPENDENT_AMBULATORY_CARE_PROVIDER_SITE_OTHER): Payer: Self-pay | Admitting: Obstetrics and Gynecology

## 2018-01-15 ENCOUNTER — Encounter: Payer: Self-pay | Admitting: Obstetrics and Gynecology

## 2018-01-15 ENCOUNTER — Encounter
Admission: RE | Admit: 2018-01-15 | Discharge: 2018-01-15 | Disposition: A | Discharge status: Home / Self Care | Payer: Self-pay | Source: Ambulatory Visit | Attending: Obstetrics and Gynecology | Admitting: Obstetrics and Gynecology

## 2018-01-15 ENCOUNTER — Other Ambulatory Visit: Payer: Self-pay

## 2018-01-15 VITALS — BP 126/80 | Ht 67.0 in | Wt 154.0 lb

## 2018-01-15 DIAGNOSIS — N809 Endometriosis, unspecified: Secondary | ICD-10-CM

## 2018-01-15 DIAGNOSIS — Z01812 Encounter for preprocedural laboratory examination: Secondary | ICD-10-CM | POA: Insufficient documentation

## 2018-01-15 DIAGNOSIS — N7093 Salpingitis and oophoritis, unspecified: Secondary | ICD-10-CM

## 2018-01-15 DIAGNOSIS — N80129 Deep endometriosis of ovary, unspecified ovary: Secondary | ICD-10-CM

## 2018-01-15 DIAGNOSIS — R1031 Right lower quadrant pain: Secondary | ICD-10-CM

## 2018-01-15 HISTORY — DX: Major depressive disorder, single episode, unspecified: F32.9

## 2018-01-15 HISTORY — DX: Depression, unspecified: F32.A

## 2018-01-15 LAB — CBC
HCT: 41.3 % (ref 35.0–47.0)
Hemoglobin: 13.8 g/dL (ref 12.0–16.0)
MCH: 30.3 pg (ref 26.0–34.0)
MCHC: 33.4 g/dL (ref 32.0–36.0)
MCV: 90.6 fL (ref 80.0–100.0)
PLATELETS: 274 10*3/uL (ref 150–440)
RBC: 4.56 MIL/uL (ref 3.80–5.20)
RDW: 15.9 % — AB (ref 11.5–14.5)
WBC: 10.5 10*3/uL (ref 3.6–11.0)

## 2018-01-15 LAB — COMPREHENSIVE METABOLIC PANEL
ALBUMIN: 4.4 g/dL (ref 3.5–5.0)
ALT: 12 U/L — ABNORMAL LOW (ref 14–54)
ANION GAP: 6 (ref 5–15)
AST: 19 U/L (ref 15–41)
Alkaline Phosphatase: 66 U/L (ref 38–126)
BILIRUBIN TOTAL: 0.6 mg/dL (ref 0.3–1.2)
BUN: 8 mg/dL (ref 6–20)
CO2: 28 mmol/L (ref 22–32)
Calcium: 9.1 mg/dL (ref 8.9–10.3)
Chloride: 104 mmol/L (ref 101–111)
Creatinine, Ser: 0.72 mg/dL (ref 0.44–1.00)
GFR calc Af Amer: >60 mL/min (ref >60)
GLUCOSE: 84 mg/dL (ref 65–99)
POTASSIUM: 3.7 mmol/L (ref 3.5–5.1)
Sodium: 138 mmol/L (ref 135–145)
TOTAL PROTEIN: 7.4 g/dL (ref 6.5–8.1)

## 2018-01-15 MED ORDER — CEFAZOLIN SODIUM-DEXTROSE 2-4 GM/100ML-% IV SOLN
2.0000 g | INTRAVENOUS | Status: DC
  Filled 2018-01-15: qty 100

## 2018-01-15 NOTE — Progress Notes (Signed)
Preoperative History and Physical  Holly Vega is a 42 y.o. [id number] here for surgical management of right pyosalpinx, endometrioma (right), and recurrent right lower quadrant abdominal pain.   No significant preoperative concerns.  History of Present Illness: 42 y.o. [id number] who presents in follow up from an ER visit yesterday for abdominal pain of  Acute onset. Findings consistent with right endometrioma (which has been seen on previous imaging in 01/2017 when the patient had an admission for pain and possible TOA vs endometrioma and right hydrosalpinx). She also had the persistence of a right hydrosalpinx.  She presented to the ER in the middle of the night yesterday for acute onset abdominal pain.  The pain started in the right lower quadrant and seemed to move to her mid-abdomen.  She felt nauseated.  She had a little pain since her hospitalization last year, but not much.  She has regular menses, but does have intermenstrual spotting (brownish-red).   She can think of nothing that made the pain come on.  She took an Aleve, but the pain sort of went away on its own.  She has no pain at this point.  Of note the endometrioma remains unchanged in size and is about 8 cm.   Proposed surgery: Total laparoscopic hysterectomy, bilateral salpingectomy, right oophorectomy, removal of right ovarian cyst, cystoscopy  Past Medical History:  Diagnosis Date  . Endometriosis    Past Surgical History: denies   OB History  Gravida Para Term Preterm AB Living  3       2 1   SAB TAB Ectopic Multiple Live Births          1    # Outcome Date GA Lbr Len/2nd Weight Sex Delivery Anes PTL Lv  3 Gravida           2 AB           1 AB           Patient denies any other pertinent gynecologic issues.   Medications: denies   No current facility-administered medications on file prior to visit.    No Known Allergies  Social History:   reports that she has been smoking cigarettes.  She has never used  smokeless tobacco. She reports that she does not drink alcohol or use drugs.  Family History  Problem Relation Age of Onset  . Cancer Maternal Uncle     Review of Systems: Noncontributory  PHYSICAL EXAM: Blood pressure 126/80, height 5\' 7"  (1.702 m), weight 154 lb (69.9 kg), last menstrual period 01/03/2018. Physical Exam  Constitutional: She is oriented to person, place, and time. She appears well-developed and well-nourished. No distress.  HENT:  Head: Normocephalic and atraumatic.  Eyes: Conjunctivae are normal.  Neck: Normal range of motion. Neck supple. No thyromegaly present.  Cardiovascular: Normal rate, regular rhythm and normal heart sounds. Exam reveals no gallop and no friction rub.  No murmur heard. Pulmonary/Chest: Effort normal and breath sounds normal. She has no wheezes.  Abdominal: Soft. She exhibits no distension and no mass. There is no tenderness. There is no rebound and no guarding. No hernia.  Genitourinary: Pelvic exam was performed with patient supine.  Musculoskeletal: Normal range of motion.  Lymphadenopathy:       Right: No inguinal adenopathy present.       Left: No inguinal adenopathy present.  Neurological: She is alert and oriented to person, place, and time.  Skin: Skin is warm and dry. No rash noted.  Psychiatric: She  has a normal mood and affect. Her behavior is normal.   Assessment: Patient Active Problem List   Diagnosis Date Noted  . Right lower quadrant pain 02/24/2017  . Endometrioma 02/15/2017  . Tubo-ovarian abscess 02/12/2017  . Right pyosalpinx 02/12/2017    Plan: Patient will undergo surgical management with the procedures noted above.   The risks of surgery were discussed in detail with the patient including but not limited to: bleeding which may require transfusion or reoperation; infection which may require antibiotics; injury to surrounding organs which may involve bowel, bladder, ureters ; need for additional procedures  including laparoscopy or laparotomy; thromboembolic phenomenon, surgical site problems and other postoperative/anesthesia complications. Likelihood of success in alleviating the patient's condition was discussed. Routine postoperative instructions will be reviewed with the patient and her family in detail after surgery.  The patient concurred with the proposed plan, giving informed written consent for the surgery.  Preoperative prophylactic antibiotics, as indicated, and SCDs ordered on call to the OR.    Prentice Docker, MD 01/15/2018 10:25 AM

## 2018-01-15 NOTE — H&P (View-Only) (Signed)
Preoperative History and Physical  Holly Vega is a 42 y.o. [id number] here for surgical management of right pyosalpinx, endometrioma (right), and recurrent right lower quadrant abdominal pain.   No significant preoperative concerns.  History of Present Illness: 42 y.o. [id number] who presents in follow up from an ER visit yesterday for abdominal pain of  Acute onset. Findings consistent with right endometrioma (which has been seen on previous imaging in 01/2017 when the patient had an admission for pain and possible TOA vs endometrioma and right hydrosalpinx). She also had the persistence of a right hydrosalpinx.  She presented to the ER in the middle of the night yesterday for acute onset abdominal pain.  The pain started in the right lower quadrant and seemed to move to her mid-abdomen.  She felt nauseated.  She had a little pain since her hospitalization last year, but not much.  She has regular menses, but does have intermenstrual spotting (brownish-red).   She can think of nothing that made the pain come on.  She took an Aleve, but the pain sort of went away on its own.  She has no pain at this point.  Of note the endometrioma remains unchanged in size and is about 8 cm.   Proposed surgery: Total laparoscopic hysterectomy, bilateral salpingectomy, right oophorectomy, removal of right ovarian cyst, cystoscopy  Past Medical History:  Diagnosis Date  . Endometriosis    Past Surgical History: denies   OB History  Gravida Para Term Preterm AB Living  3       2 1   SAB TAB Ectopic Multiple Live Births          1    # Outcome Date GA Lbr Len/2nd Weight Sex Delivery Anes PTL Lv  3 Gravida           2 AB           1 AB           Patient denies any other pertinent gynecologic issues.   Medications: denies   No current facility-administered medications on file prior to visit.    No Known Allergies  Social History:   reports that she has been smoking cigarettes.  She has never used  smokeless tobacco. She reports that she does not drink alcohol or use drugs.  Family History  Problem Relation Age of Onset  . Cancer Maternal Uncle     Review of Systems: Noncontributory  PHYSICAL EXAM: Blood pressure 126/80, height 5\' 7"  (1.702 m), weight 154 lb (69.9 kg), last menstrual period 01/03/2018. Physical Exam  Constitutional: She is oriented to person, place, and time. She appears well-developed and well-nourished. No distress.  HENT:  Head: Normocephalic and atraumatic.  Eyes: Conjunctivae are normal.  Neck: Normal range of motion. Neck supple. No thyromegaly present.  Cardiovascular: Normal rate, regular rhythm and normal heart sounds. Exam reveals no gallop and no friction rub.  No murmur heard. Pulmonary/Chest: Effort normal and breath sounds normal. She has no wheezes.  Abdominal: Soft. She exhibits no distension and no mass. There is no tenderness. There is no rebound and no guarding. No hernia.  Genitourinary: Pelvic exam was performed with patient supine.  Musculoskeletal: Normal range of motion.  Lymphadenopathy:       Right: No inguinal adenopathy present.       Left: No inguinal adenopathy present.  Neurological: She is alert and oriented to person, place, and time.  Skin: Skin is warm and dry. No rash noted.  Psychiatric: She  has a normal mood and affect. Her behavior is normal.   Assessment: Patient Active Problem List   Diagnosis Date Noted  . Right lower quadrant pain 02/24/2017  . Endometrioma 02/15/2017  . Tubo-ovarian abscess 02/12/2017  . Right pyosalpinx 02/12/2017    Plan: Patient will undergo surgical management with the procedures noted above.   The risks of surgery were discussed in detail with the patient including but not limited to: bleeding which may require transfusion or reoperation; infection which may require antibiotics; injury to surrounding organs which may involve bowel, bladder, ureters ; need for additional procedures  including laparoscopy or laparotomy; thromboembolic phenomenon, surgical site problems and other postoperative/anesthesia complications. Likelihood of success in alleviating the patient's condition was discussed. Routine postoperative instructions will be reviewed with the patient and her family in detail after surgery.  The patient concurred with the proposed plan, giving informed written consent for the surgery.  Preoperative prophylactic antibiotics, as indicated, and SCDs ordered on call to the OR.    Prentice Docker, MD 01/15/2018 10:25 AM

## 2018-01-24 LAB — TYPE AND SCREEN
ABO/RH(D): O POS
Antibody Screen: NEGATIVE

## 2018-01-25 ENCOUNTER — Ambulatory Visit: Payer: Self-pay | Admitting: Certified Registered"

## 2018-01-25 ENCOUNTER — Encounter: Payer: Self-pay | Admitting: *Deleted

## 2018-01-25 ENCOUNTER — Other Ambulatory Visit: Payer: Self-pay | Admitting: Obstetrics and Gynecology

## 2018-01-25 ENCOUNTER — Other Ambulatory Visit: Payer: Self-pay

## 2018-01-25 ENCOUNTER — Encounter
Admission: RE | Disposition: A | Discharge status: Home / Self Care | Payer: Self-pay | Source: Ambulatory Visit | Attending: Obstetrics and Gynecology

## 2018-01-25 ENCOUNTER — Ambulatory Visit
Admission: RE | Admit: 2018-01-25 | Discharge: 2018-01-25 | Disposition: A | Discharge status: Home / Self Care | Payer: Self-pay | Source: Ambulatory Visit | Attending: Obstetrics and Gynecology | Admitting: Obstetrics and Gynecology

## 2018-01-25 DIAGNOSIS — N736 Female pelvic peritoneal adhesions (postinfective): Secondary | ICD-10-CM | POA: Diagnosis present

## 2018-01-25 DIAGNOSIS — R1031 Right lower quadrant pain: Secondary | ICD-10-CM

## 2018-01-25 DIAGNOSIS — N801 Endometriosis of ovary: Secondary | ICD-10-CM | POA: Insufficient documentation

## 2018-01-25 DIAGNOSIS — Z79899 Other long term (current) drug therapy: Secondary | ICD-10-CM | POA: Insufficient documentation

## 2018-01-25 DIAGNOSIS — N809 Endometriosis, unspecified: Secondary | ICD-10-CM | POA: Diagnosis present

## 2018-01-25 DIAGNOSIS — N80129 Deep endometriosis of ovary, unspecified ovary: Secondary | ICD-10-CM | POA: Diagnosis present

## 2018-01-25 DIAGNOSIS — F17210 Nicotine dependence, cigarettes, uncomplicated: Secondary | ICD-10-CM | POA: Insufficient documentation

## 2018-01-25 DIAGNOSIS — F329 Major depressive disorder, single episode, unspecified: Secondary | ICD-10-CM | POA: Insufficient documentation

## 2018-01-25 HISTORY — PX: LAPAROSCOPY: SHX197

## 2018-01-25 LAB — ABO/RH: ABO/RH(D): O POS

## 2018-01-25 LAB — POCT PREGNANCY, URINE: Preg Test, Ur: NEGATIVE

## 2018-01-25 SURGERY — LAPAROSCOPY, DIAGNOSTIC
Anesthesia: General | Laterality: Right | Wound class: Clean Contaminated

## 2018-01-25 MED ORDER — HYDROMORPHONE HCL 1 MG/ML IJ SOLN
INTRAMUSCULAR | Status: DC | PRN
  Administered 2018-01-25: 2 mg via INTRAVENOUS

## 2018-01-25 MED ORDER — OXYCODONE-ACETAMINOPHEN 5-325 MG PO TABS: 1.0000 | ORAL_TABLET | ORAL | 0 refills | Status: AC | PRN

## 2018-01-25 MED ORDER — BUPIVACAINE HCL 0.5 % IJ SOLN
INTRAMUSCULAR | Status: DC | PRN
  Administered 2018-01-25: 11 mL

## 2018-01-25 MED ORDER — ACETAMINOPHEN NICU IV SYRINGE 10 MG/ML
INTRAVENOUS | Status: AC
  Filled 2018-01-25: qty 1

## 2018-01-25 MED ORDER — ONDANSETRON HCL 4 MG/2ML IJ SOLN: 4.0000 mg | Freq: Once | INTRAMUSCULAR | Status: DC | PRN

## 2018-01-25 MED ORDER — ACETAMINOPHEN 10 MG/ML IV SOLN
INTRAVENOUS | Status: DC | PRN
  Administered 2018-01-25: 1000 mg via INTRAVENOUS

## 2018-01-25 MED ORDER — SILVER NITRATE-POT NITRATE 75-25 % EX MISC
CUTANEOUS | Status: DC | PRN
  Administered 2018-01-25: 2 via TOPICAL

## 2018-01-25 MED ORDER — CEFAZOLIN SODIUM-DEXTROSE 2-4 GM/100ML-% IV SOLN
INTRAVENOUS | Status: AC
  Filled 2018-01-25: qty 100

## 2018-01-25 MED ORDER — PROPOFOL 10 MG/ML IV BOLUS
INTRAVENOUS | Status: AC
  Filled 2018-01-25: qty 20

## 2018-01-25 MED ORDER — FAMOTIDINE 20 MG PO TABS
20.0000 mg | ORAL_TABLET | Freq: Once | ORAL | Status: AC
  Administered 2018-01-25: 20 mg via ORAL

## 2018-01-25 MED ORDER — SILVER NITRATE-POT NITRATE 75-25 % EX MISC
CUTANEOUS | Status: AC
  Filled 2018-01-25: qty 1

## 2018-01-25 MED ORDER — KETOROLAC TROMETHAMINE 30 MG/ML IJ SOLN
INTRAMUSCULAR | Status: AC
  Filled 2018-01-25: qty 1

## 2018-01-25 MED ORDER — MIDAZOLAM HCL 5 MG/5ML IJ SOLN
INTRAMUSCULAR | Status: AC
  Filled 2018-01-25: qty 5

## 2018-01-25 MED ORDER — SUGAMMADEX SODIUM 200 MG/2ML IV SOLN
INTRAVENOUS | Status: AC
  Filled 2018-01-25: qty 2

## 2018-01-25 MED ORDER — ONDANSETRON HCL 4 MG/2ML IJ SOLN
INTRAMUSCULAR | Status: DC | PRN
  Administered 2018-01-25: 4 mg via INTRAVENOUS

## 2018-01-25 MED ORDER — DEXAMETHASONE SODIUM PHOSPHATE 10 MG/ML IJ SOLN
INTRAMUSCULAR | Status: AC
  Filled 2018-01-25: qty 1

## 2018-01-25 MED ORDER — CEFAZOLIN SODIUM-DEXTROSE 2-4 GM/100ML-% IV SOLN
2.0000 g | Freq: Once | INTRAVENOUS | Status: AC
  Administered 2018-01-25: 2 g via INTRAVENOUS

## 2018-01-25 MED ORDER — KETOROLAC TROMETHAMINE 30 MG/ML IJ SOLN
INTRAMUSCULAR | Status: DC | PRN
  Administered 2018-01-25: 30 mg via INTRAVENOUS

## 2018-01-25 MED ORDER — ROCURONIUM BROMIDE 50 MG/5ML IV SOLN
INTRAVENOUS | Status: AC
  Filled 2018-01-25: qty 2

## 2018-01-25 MED ORDER — ONDANSETRON HCL 4 MG/2ML IJ SOLN
INTRAMUSCULAR | Status: AC
  Filled 2018-01-25: qty 2

## 2018-01-25 MED ORDER — KETAMINE HCL 50 MG/ML IJ SOLN
INTRAMUSCULAR | Status: AC
  Filled 2018-01-25: qty 10

## 2018-01-25 MED ORDER — GLYCOPYRROLATE 0.2 MG/ML IJ SOLN
INTRAMUSCULAR | Status: AC
  Filled 2018-01-25: qty 1

## 2018-01-25 MED ORDER — FENTANYL CITRATE (PF) 100 MCG/2ML IJ SOLN: 25.0000 ug | INTRAMUSCULAR | Status: DC | PRN

## 2018-01-25 MED ORDER — LACTATED RINGERS IV SOLN
INTRAVENOUS | Status: DC
  Administered 2018-01-25 (×2): via INTRAVENOUS

## 2018-01-25 MED ORDER — PROPOFOL 10 MG/ML IV BOLUS
INTRAVENOUS | Status: DC | PRN
  Administered 2018-01-25: 50 mg via INTRAVENOUS
  Administered 2018-01-25: 150 mg via INTRAVENOUS

## 2018-01-25 MED ORDER — ROCURONIUM BROMIDE 100 MG/10ML IV SOLN
INTRAVENOUS | Status: DC | PRN
  Administered 2018-01-25: 20 mg via INTRAVENOUS
  Administered 2018-01-25: 50 mg via INTRAVENOUS

## 2018-01-25 MED ORDER — LIDOCAINE HCL (CARDIAC) PF 100 MG/5ML IV SOSY
PREFILLED_SYRINGE | INTRAVENOUS | Status: DC | PRN
  Administered 2018-01-25: 50 mg via INTRAVENOUS

## 2018-01-25 MED ORDER — LIDOCAINE HCL (PF) 2 % IJ SOLN
INTRAMUSCULAR | Status: AC
  Filled 2018-01-25: qty 10

## 2018-01-25 MED ORDER — KETAMINE HCL 10 MG/ML IJ SOLN
INTRAMUSCULAR | Status: DC | PRN
  Administered 2018-01-25: 30 mg via INTRAVENOUS
  Administered 2018-01-25: 20 mg via INTRAVENOUS

## 2018-01-25 MED ORDER — DEXAMETHASONE SODIUM PHOSPHATE 10 MG/ML IJ SOLN
INTRAMUSCULAR | Status: DC | PRN
  Administered 2018-01-25: 10 mg via INTRAVENOUS

## 2018-01-25 MED ORDER — BUPIVACAINE HCL (PF) 0.5 % IJ SOLN
INTRAMUSCULAR | Status: AC
  Filled 2018-01-25: qty 30

## 2018-01-25 MED ORDER — GLYCOPYRROLATE 0.2 MG/ML IJ SOLN
INTRAMUSCULAR | Status: DC | PRN
  Administered 2018-01-25: 0.1 mg via INTRAVENOUS

## 2018-01-25 MED ORDER — MIDAZOLAM HCL 2 MG/2ML IJ SOLN
INTRAMUSCULAR | Status: DC | PRN
  Administered 2018-01-25: 5 mg via INTRAVENOUS

## 2018-01-25 MED ORDER — IBUPROFEN 600 MG PO TABS: 600.0000 mg | ORAL_TABLET | Freq: Four times a day (QID) | ORAL | 0 refills | Status: DC | PRN

## 2018-01-25 MED ORDER — HYDROMORPHONE HCL 1 MG/ML IJ SOLN
INTRAMUSCULAR | Status: AC
  Filled 2018-01-25: qty 2

## 2018-01-25 MED ORDER — FAMOTIDINE 20 MG PO TABS
ORAL_TABLET | ORAL | Status: AC
  Administered 2018-01-25: 20 mg via ORAL
  Filled 2018-01-25: qty 1

## 2018-01-25 MED ORDER — SUGAMMADEX SODIUM 200 MG/2ML IV SOLN
INTRAVENOUS | Status: DC | PRN
  Administered 2018-01-25: 200 mg via INTRAVENOUS

## 2018-01-25 SURGICAL SUPPLY — 63 items
BAG URINE DRAINAGE (UROLOGICAL SUPPLIES) ×5 IMPLANT
BLADE SURG SZ11 CARB STEEL (BLADE) ×5 IMPLANT
CANISTER SUCT 1200ML W/VALVE (MISCELLANEOUS) ×5 IMPLANT
CATH FOLEY 2WAY  5CC 16FR (CATHETERS) ×2
CATH ROBINSON RED A/P 16FR (CATHETERS) IMPLANT
CATH URTH 16FR FL 2W BLN LF (CATHETERS) ×3 IMPLANT
CHLORAPREP W/TINT 26ML (MISCELLANEOUS) ×5 IMPLANT
DEFOGGER SCOPE WARMER CLEARIFY (MISCELLANEOUS) ×5 IMPLANT
DERMABOND ADVANCED (GAUZE/BANDAGES/DRESSINGS) ×2
DERMABOND ADVANCED .7 DNX12 (GAUZE/BANDAGES/DRESSINGS) ×3 IMPLANT
DEVICE SUTURE ENDOST 10MM (ENDOMECHANICALS) IMPLANT
DRAPE LEGGINS SURG 28X43 STRL (DRAPES) ×5 IMPLANT
DRAPE SHEET LG 3/4 BI-LAMINATE (DRAPES) ×5 IMPLANT
DRAPE UNDER BUTTOCK W/FLU (DRAPES) ×5 IMPLANT
ELECT REM PT RETURN 9FT ADLT (ELECTROSURGICAL) ×5
ELECTRODE REM PT RTRN 9FT ADLT (ELECTROSURGICAL) ×3 IMPLANT
GLOVE BIO SURGEON STRL SZ7 (GLOVE) ×15 IMPLANT
GLOVE BIOGEL PI IND STRL 7.5 (GLOVE) ×3 IMPLANT
GLOVE BIOGEL PI INDICATOR 7.5 (GLOVE) ×2
GLOVE INDICATOR 7.5 STRL GRN (GLOVE) ×20 IMPLANT
GOWN STRL REUS W/ TWL LRG LVL3 (GOWN DISPOSABLE) ×9 IMPLANT
GOWN STRL REUS W/ TWL XL LVL3 (GOWN DISPOSABLE) ×3 IMPLANT
GOWN STRL REUS W/TWL LRG LVL3 (GOWN DISPOSABLE) ×6
GOWN STRL REUS W/TWL XL LVL3 (GOWN DISPOSABLE) ×2
GRASPER SUT TROCAR 14GX15 (MISCELLANEOUS) ×5 IMPLANT
IRRIGATION STRYKERFLOW (MISCELLANEOUS) ×3 IMPLANT
IRRIGATOR STRYKERFLOW (MISCELLANEOUS) ×5
IV LACTATED RINGERS 1000ML (IV SOLUTION) ×10 IMPLANT
KIT PINK PAD W/HEAD ARE REST (MISCELLANEOUS) ×5
KIT PINK PAD W/HEAD ARM REST (MISCELLANEOUS) ×3 IMPLANT
KIT TURNOVER CYSTO (KITS) ×5 IMPLANT
LABEL OR SOLS (LABEL) ×5 IMPLANT
LIGASURE VESSEL 5MM BLUNT TIP (ELECTROSURGICAL) ×5 IMPLANT
MANIPULATOR VCARE LG CRV RETR (MISCELLANEOUS) ×5 IMPLANT
MANIPULATOR VCARE SML CRV RETR (MISCELLANEOUS) IMPLANT
MANIPULATOR VCARE STD CRV RETR (MISCELLANEOUS) IMPLANT
NEEDLE HYPO 22GX1.5 SAFETY (NEEDLE) ×5 IMPLANT
NS IRRIG 500ML POUR BTL (IV SOLUTION) ×5 IMPLANT
OCCLUDER COLPOPNEUMO (BALLOONS) ×5 IMPLANT
PACK LAP CHOLECYSTECTOMY (MISCELLANEOUS) ×5 IMPLANT
PAD OB MATERNITY 4.3X12.25 (PERSONAL CARE ITEMS) ×5 IMPLANT
PAD PREP 24X41 OB/GYN DISP (PERSONAL CARE ITEMS) ×5 IMPLANT
SCISSORS METZENBAUM CVD 33 (INSTRUMENTS) ×5 IMPLANT
SET CYSTO W/LG BORE CLAMP LF (SET/KITS/TRAYS/PACK) ×5 IMPLANT
SLEEVE ENDOPATH XCEL 5M (ENDOMECHANICALS) ×5 IMPLANT
SOL PREP PVP 2OZ (MISCELLANEOUS) ×5
SOLUTION ELECTROLUBE (MISCELLANEOUS) IMPLANT
SOLUTION PREP PVP 2OZ (MISCELLANEOUS) ×3 IMPLANT
SPONGE XRAY 4X4 16PLY STRL (MISCELLANEOUS) IMPLANT
SURGILUBE 2OZ TUBE FLIPTOP (MISCELLANEOUS) ×5 IMPLANT
SUT ENDO VLOC 180-0-8IN (SUTURE) IMPLANT
SUT MNCRL 4-0 (SUTURE) ×2
SUT MNCRL 4-0 27XMFL (SUTURE) ×3
SUT VIC AB 0 CT1 36 (SUTURE) ×5 IMPLANT
SUT VIC AB 2-0 UR6 27 (SUTURE) IMPLANT
SUTURE MNCRL 4-0 27XMF (SUTURE) ×3 IMPLANT
SYR 10ML LL (SYRINGE) ×10 IMPLANT
SYR 50ML LL SCALE MARK (SYRINGE) ×5 IMPLANT
TROCAR ENDO BLADELESS 11MM (ENDOMECHANICALS) ×5 IMPLANT
TROCAR XCEL NON-BLD 5MMX100MML (ENDOMECHANICALS) ×5 IMPLANT
TROCAR XCEL UNIV SLVE 11M 100M (ENDOMECHANICALS) IMPLANT
TUBING INSUF HEATED (TUBING) ×5 IMPLANT
TUBING INSUFFLATION (TUBING) ×5 IMPLANT

## 2018-01-25 NOTE — Discharge Instructions (Signed)

## 2018-01-25 NOTE — Anesthesia Preprocedure Evaluation (Signed)
Anesthesia Evaluation  Patient identified by MRN, date of birth, ID band Patient awake    Reviewed: Allergy & Precautions, NPO status , Patient's Chart, lab work & pertinent test results  Airway Mallampati: II  TM Distance: >3 FB     Dental  (+) Poor Dentition, Loose   Pulmonary Current Smoker,    Pulmonary exam normal        Cardiovascular negative cardio ROS Normal cardiovascular exam     Neuro/Psych PSYCHIATRIC DISORDERS Depression negative neurological ROS     GI/Hepatic Neg liver ROS,   Endo/Other  negative endocrine ROS  Renal/GU negative Renal ROS  negative genitourinary   Musculoskeletal negative musculoskeletal ROS (+)   Abdominal Normal abdominal exam  (+)   Peds negative pediatric ROS (+)  Hematology negative hematology ROS (+)   Anesthesia Other Findings Patient has current cough, but states that it is sinus and not a cold...somewhat chronic.  Reproductive/Obstetrics                             Anesthesia Physical Anesthesia Plan  ASA: II  Anesthesia Plan: General   Post-op Pain Management:    Induction: Intravenous  PONV Risk Score and Plan:   Airway Management Planned: Oral ETT  Additional Equipment:   Intra-op Plan:   Post-operative Plan: Extubation in OR  Informed Consent: I have reviewed the patients History and Physical, chart, labs and discussed the procedure including the risks, benefits and alternatives for the proposed anesthesia with the patient or authorized representative who has indicated his/her understanding and acceptance.   Dental advisory given  Plan Discussed with: Surgeon and CRNA  Anesthesia Plan Comments:         Anesthesia Quick Evaluation

## 2018-01-25 NOTE — Transfer of Care (Signed)
Immediate Anesthesia Transfer of Care Note  Patient: Holly Vega  Procedure(s) Performed: LAPAROSCOPY DIAGNOSTIC  Patient Location: PACU  Anesthesia Type:General  Level of Consciousness: drowsy and patient cooperative  Airway & Oxygen Therapy: Patient Spontanous Breathing and Patient connected to face mask oxygen  Post-op Assessment: Report given to RN, Post -op Vital signs reviewed and stable and Patient moving all extremities  Post vital signs: Reviewed and stable  Last Vitals:  Vitals Value Taken Time  BP 106/70 01/25/2018  9:25 AM  Temp    Pulse 69 01/25/2018  9:27 AM  Resp 10 01/25/2018  9:27 AM  SpO2 100 % 01/25/2018  9:27 AM  Vitals shown include unvalidated device data.  Last Pain:  Vitals:   01/25/18 0643  TempSrc: Oral  PainSc: 0-No pain         Complications: No apparent anesthesia complications

## 2018-01-25 NOTE — Anesthesia Procedure Notes (Signed)
Procedure Name: Intubation Date/Time: 01/25/2018 7:35 AM Performed by: Nile Riggs, CRNA Pre-anesthesia Checklist: Patient identified, Emergency Drugs available, Suction available, Patient being monitored and Timeout performed Patient Re-evaluated:Patient Re-evaluated prior to induction Oxygen Delivery Method: Circle system utilized Preoxygenation: Pre-oxygenation with 100% oxygen Induction Type: IV induction and Cricoid Pressure applied Ventilation: Mask ventilation without difficulty Laryngoscope Size: Miller and 2 Grade View: Grade II Tube type: Oral Tube size: 7.5 mm Number of attempts: 1 Airway Equipment and Method: Stylet Placement Confirmation: ETT inserted through vocal cords under direct vision,  positive ETCO2,  CO2 detector and breath sounds checked- equal and bilateral Secured at: 22 cm Tube secured with: Tape Dental Injury: Teeth and Oropharynx as per pre-operative assessment

## 2018-01-25 NOTE — Anesthesia Postprocedure Evaluation (Signed)
Anesthesia Post Note  Patient: Holly Vega  Procedure(s) Performed: LAPAROSCOPY DIAGNOSTIC  Patient location during evaluation: PACU Anesthesia Type: General Level of consciousness: awake and alert and oriented Pain management: pain level controlled Vital Signs Assessment: post-procedure vital signs reviewed and stable Respiratory status: spontaneous breathing Cardiovascular status: blood pressure returned to baseline Anesthetic complications: no     Last Vitals:  Vitals:   01/25/18 1020 01/25/18 1239  BP: 113/67 124/63  Pulse: 68 62  Resp: 20 18  Temp: 36.6 C   SpO2: 95% 99%    Last Pain:  Vitals:   01/25/18 1020  TempSrc: Oral  PainSc: 0-No pain                 Marty Uy

## 2018-01-25 NOTE — Anesthesia Post-op Follow-up Note (Signed)
Anesthesia QCDR form completed.        

## 2018-01-25 NOTE — Interval H&P Note (Signed)
History and Physical Interval Note:  01/25/2018 7:17 AM  Holly Vega  has presented today for surgery, with the diagnosis of ENDOMETRIOMA,HYDROSALPINX, RIGHT LOWER QUADRANT ABDOMINAL PAIN  The various methods of treatment have been discussed with the patient and family. After consideration of risks, benefits and other options for treatment, the patient has consented to  Procedure(s): HYSTERECTOMY TOTAL LAPAROSCOPIC BILATERAL SALPINGECTOMY RIGHT OOPHORECTOMY (N/A) CYSTOSCOPY (N/A) LAPAROSCOPIC OVARIAN CYSTECTOMY (Right) and possible left oophorectomy (depending on findings and if severe endometriosis found and unable to be removed) as a surgical intervention .  The patient's history has been reviewed, patient examined, no change in status, stable for surgery.  I have reviewed the patient's chart and labs.  Questions were answered to the patient's satisfaction.  Consent form reviewed and verified.  Medicaid sterilization form signed and witnessed.   Prentice Docker, MD, [facility] 01/25/2018 7:18 AM

## 2018-01-25 NOTE — Op Note (Signed)
Operative Note    Pre-Op Diagnosis:  1. right ovarian endometrioma 2. Right hydrosalpinx  3. Right lower quadrant pelvic pain  Post-Op Diagnosis:  1. right ovarian endometrioma 2. left ovarian cyst 3. stage 4 endometriosis  Procedures:  1. Diagnostic laparoscopy 2. Drainage of right ovarian endometrioma  Primary Surgeon: Prentice Docker, MD   Assistant Surgeon: Malachy Mood, MD  EBL: 20 mL   IVF: 1,400 mL   Urine output: 300 mL clear urine at end of case  Specimens: none  Drains: none  Complications: None   Disposition: PACU   Condition: Stable   Findings:  1.  Right ovarian endometrioma, ~9 cm 2. Left ovarian cyst6-7 cm 3. Adhesion of appendix to right pelvic sidewall and right ovarian cyst, crossing the pelvic brim 4. Right ovarian cyst adherent to bladder peritoneum 5. Left fallopian tube not identified 6. Cul-de-sac obliterated with bowel densely adherent to posterior uterus.  7. Right and left ovaries adherent to uterus, pelvic sidewalls, and bowel.  Procedure Summary:  The patient was taken to the operating room where general anesthesia was administered and found to be adequate. She was placed in the dorsal supine lithotomy position in Morrison stirrups and prepped and draped in usual sterile fashion. After a timeout was called, an indwelling catheter was placed in her bladder. A sterile speculum was placed in the vagina and a single-tooth tenaculum was used to grasp the anterior lip of the cervix. A large V-Care device was affixed to the cervix in accordance with the manufacturer's recommendations, after removal of the tenaculum.  Attention was turned to the abdomen where after injection of local anesthetic, a 5 mm infraumbilical incision was made with the scalpel. Entry into the abdomen was obtained via Optiview trocar technique (a blunt entry technique with camera visualization through the obturator upon entry). Verification of entry into the abdomen was  obtained using opening pressures. The abdomen was insufflated with CO2. The camera was introduced through the trocar with verification of atraumatic entry.  Eleven mm right lower quadrant and 5 mm left lower quadrant port sites were created under direct intra-abdominal camera visualization without difficulty.   Survey of the pelvis was undertaken with the above-noted findings.  The appendix was carefully partially dissected from the ovary and right pelvic brim.  However the appendix still remained firmly adherent to the pelvic brim and right ovarian cyst.  Care was taken to not have any serosal damage to the appendix.  The cul-de-sac was visualized with the above-noted findings.  The left ovarian cyst, right ovarian cyst, and bowel were all adherent to the posterior uterus with no discernible plane between the structures themselves and no discernible plane tween the structures and the uterus.  The adhesions of the right ovary and cyst to the bladder peritoneum were removed with blunt dissection without difficulty.  No further adhesiolysis was undertaken given the findings in the posterior cul-de-sac involving the bowel.  Consideration for high risk of bowel injury given the findings above prompted me to terminate the case in favor of having the surgery performed at a tertiary care facility and with the patient aware that a bowel surgery might be necessary.  Therefore, the surgery was terminated at this point.  Of note the right ovarian cyst was entered and drained in order to improve visualization.  A second cyst was apparent on the right ovary. However, this cyst was not drained.   The right lower quadrant trocar was removed and the fascia was reapproximated with 0 Vicryl  using a fascial closure device with a single interrupted stitch.  The abdomen was desufflated of CO2.  The remaining trochars were removed.  The right lower quadrant incision was closed using 4-0 Monocryl with a subcuticular stitch.  The  umbilical and left lower quadrant incisions were closed at the subcutaneous level with a single 4-0 Monocryl suture to reduce tension on the skin closure.  All skin sites were closed using surgical skin glue.  Attention was turned to the pelvis.  The V-care device was removed.  The tenaculum entry sites were made hemostatic using silver nitrate.  The catheter was removed from the bladder.  The vagina was inspected to ensure that no sponges or devices were left behind.  The patient tolerated the procedure well.  Sponge, lap, needle, and instrument counts were correct x 2.  VTE prophylaxis: SCDs. Antibiotic prophylaxis: Ancef 2 grams IV prior to skin closure. She was awakened in the operating room and was taken to the PACU in stable condition. An MD was utilized as a first assist in anticipation of a difficult surgery with no other qualified assistants available.  Prentice Docker, MD 01/25/2018 9:14 AM

## 2018-02-02 ENCOUNTER — Ambulatory Visit: Payer: Self-pay | Admitting: Obstetrics and Gynecology

## 2018-02-02 ENCOUNTER — Telehealth: Payer: Self-pay

## 2018-02-02 NOTE — Telephone Encounter (Signed)
Patient wishes to cancel today's appointment schedule with Dr. Gilman Schmidt and will follow up with Dr. Glennon Mac on the 02/09/18 for post op

## 2018-02-02 NOTE — Telephone Encounter (Signed)
Pt calling triage stating she is supposed to come in today for what she thinks is a rash from pain pills. States she stopped talking the pills. No fever or pain  or anything like that, just small red itchy bumps on stomach. Incision site is fine.  Pt does not have ins so wants to wait until post op appt next week to be seen. Tried to call pt with no answer. Just wanted yall to know in case she does not come for her appt today with CS.

## 2018-02-09 ENCOUNTER — Encounter: Payer: Self-pay | Admitting: Obstetrics and Gynecology

## 2018-02-09 ENCOUNTER — Ambulatory Visit (INDEPENDENT_AMBULATORY_CARE_PROVIDER_SITE_OTHER): Payer: Self-pay | Admitting: Obstetrics and Gynecology

## 2018-02-09 VITALS — BP 104/64 | Ht 67.0 in | Wt 150.0 lb

## 2018-02-09 DIAGNOSIS — N809 Endometriosis, unspecified: Secondary | ICD-10-CM

## 2018-02-09 DIAGNOSIS — Z09 Encounter for follow-up examination after completed treatment for conditions other than malignant neoplasm: Secondary | ICD-10-CM

## 2018-02-09 DIAGNOSIS — R1031 Right lower quadrant pain: Secondary | ICD-10-CM

## 2018-02-09 NOTE — Progress Notes (Signed)
   Postoperative Follow-up Patient presents post op from diagnostic laparoscopy 2 weeks ago for pelvic pain and right ovarian endometrioma, right hydrosalpinx.  Of note, the original surgery was scheduled to be a hysterectomy, but was abandoned due to the severe adhesions noted.    Subjective: Patient reports marked improvement in her preop symptoms. Eating a regular diet without difficulty. The patient is not having any pain.  Activity: normal activities of daily living.  Objective: Vitals:   02/09/18 1038  BP: 104/64   Vital Signs: BP 104/64   Ht 5\' 7"  (1.702 m)   Wt 150 lb (68 kg)   LMP 01/28/2018 (Exact Date)   BMI 23.49 kg/m  Constitutional: Well nourished, well developed female in no acute distress.  HEENT: normal Skin: Warm and dry.  Extremity: no edema  Abdomen: Soft, non-tender, normal bowel sounds; no bruits, organomegaly or masses. clean, dry, intact and all incision sites without erythema, induration, warmth, and tenderness   Assessment: 42 y.o. s/p diagnostic laparoscopy progressing well  Plan: Patient has done well after surgery with no apparent complications.  I have discussed the post-operative course to date, and the expected progress moving forward.  The patient understands what complications to be concerned about.  I will see the patient in routine follow up, or sooner if needed.    Activity plan: May return to work in 3 days with lifting restriction of 20 pounds x 21 days Referral to [facility] group already done. Patient with appt mid-June.   Prentice Docker, MD 02/09/2018, 10:43 AM

## 2018-02-21 ENCOUNTER — Other Ambulatory Visit: Payer: Self-pay | Admitting: Obstetrics and Gynecology

## 2018-03-13 IMAGING — US US PELVIS COMPLETE
1 series · 13 of 25 positions shown · non-contrast
Comparison: CT from earlier in the same day.

CLINICAL DATA: Right lower quadrant abdominal pain with abnormal
cystic lesion in the right adnexal on recent CT examination.



[Series 1: us pelvis complete · 0.25mm/px · 13 of 93 slices shown]
[im 1/93]
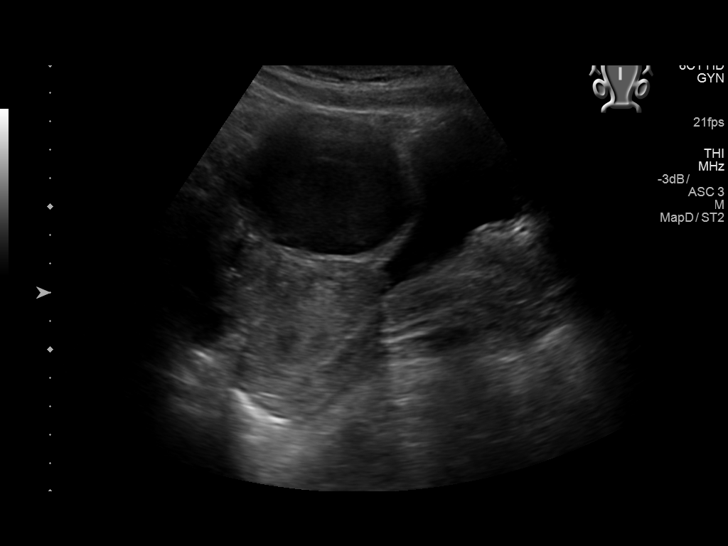
[im 8/93]
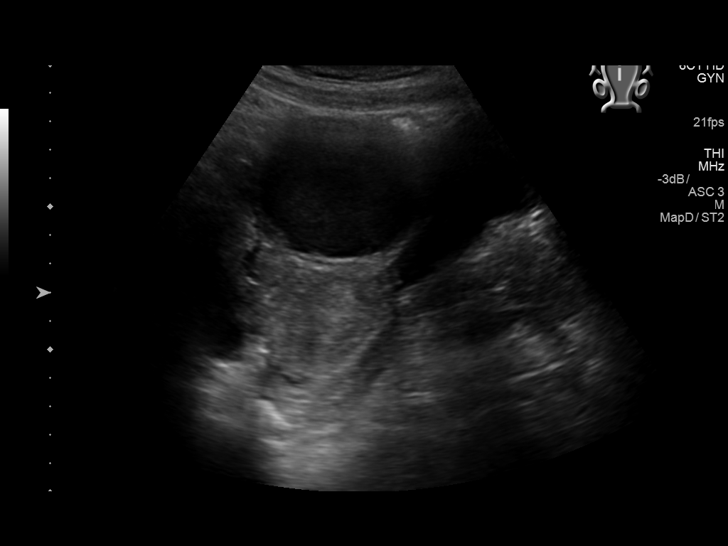
[im 16/93]
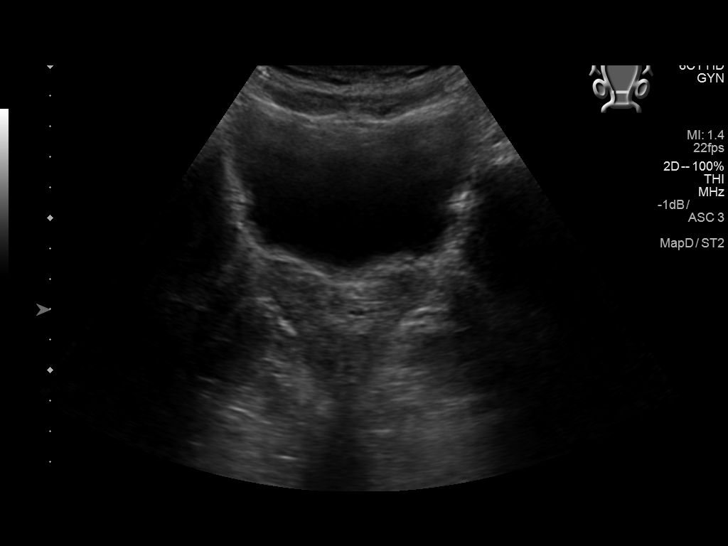
[im 24/93]
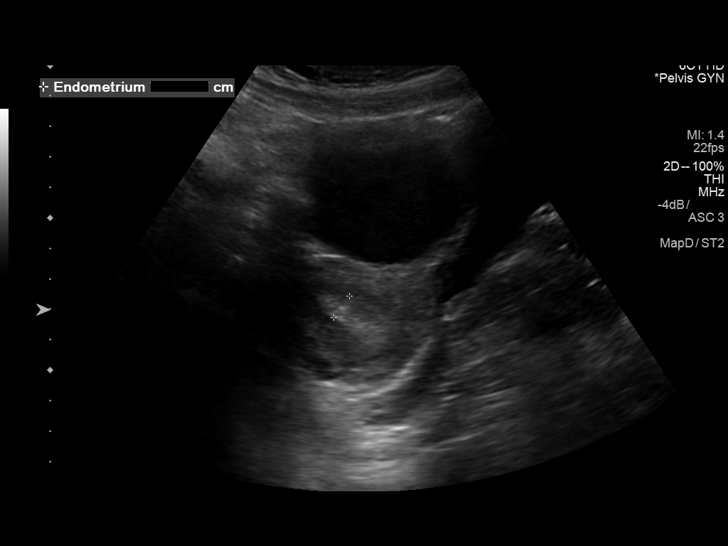
[im 31/93]
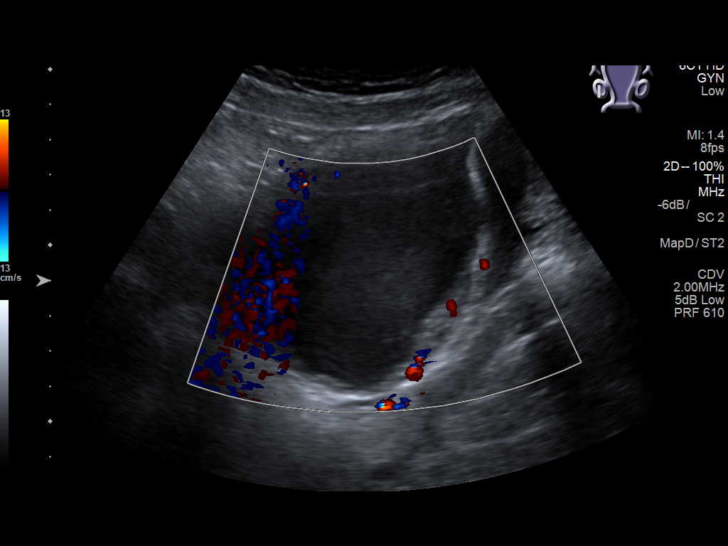
[im 39/93]
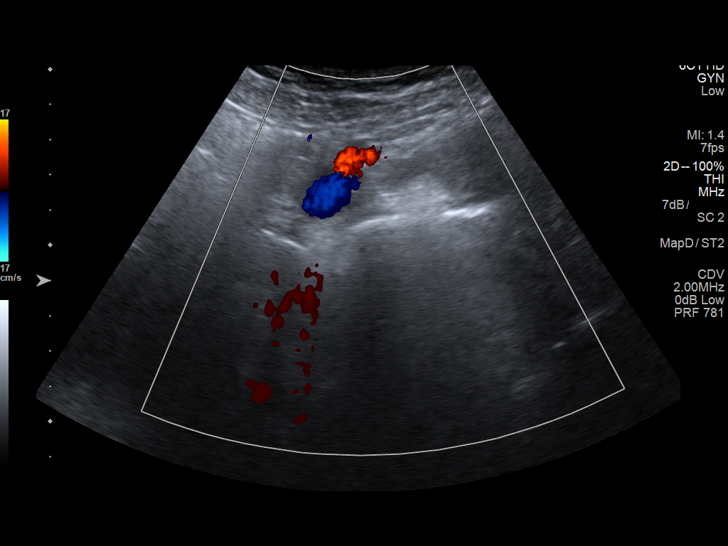
[im 47/93]
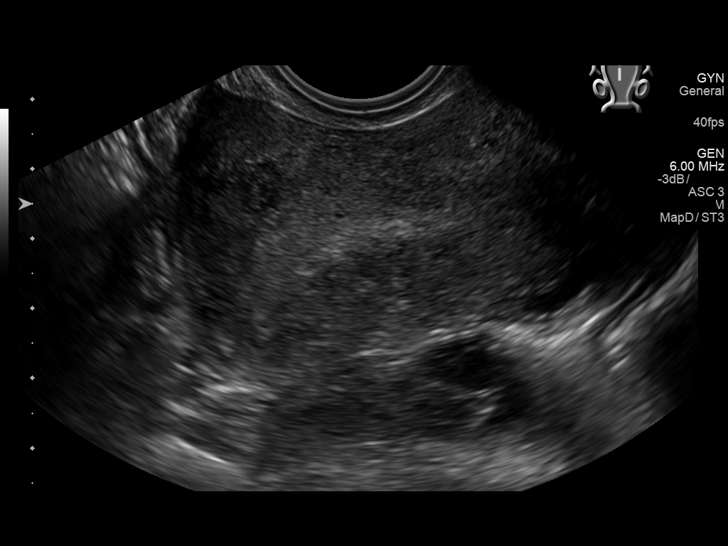
[im 54/93]
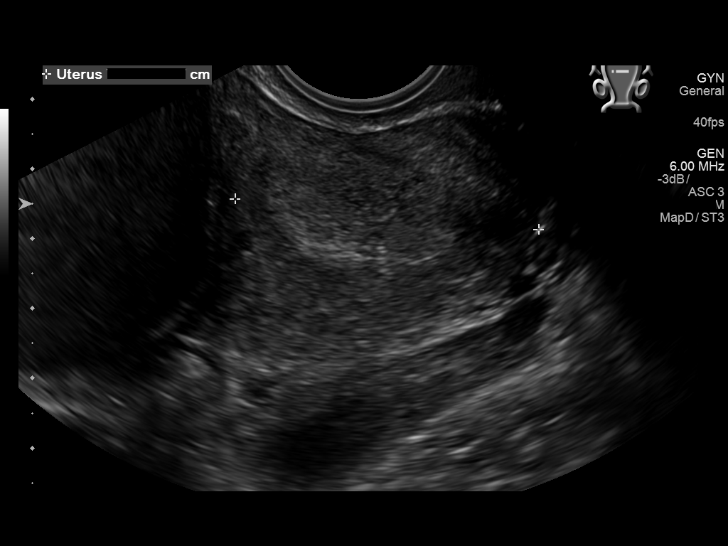
[im 62/93]
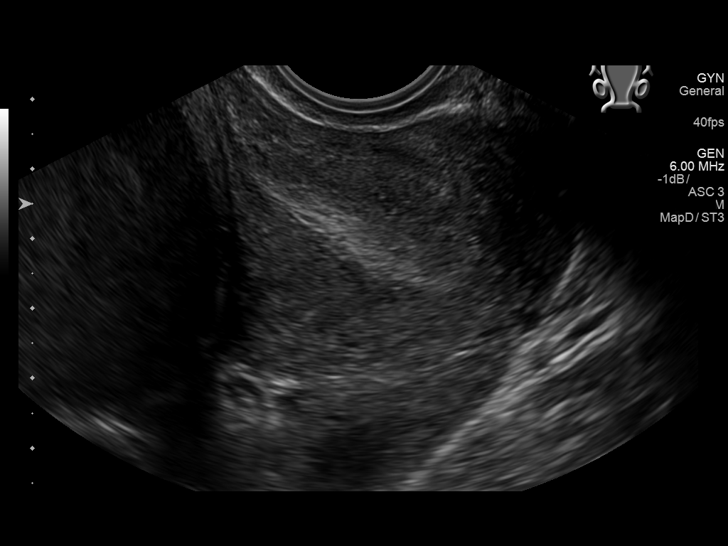
[im 70/93]
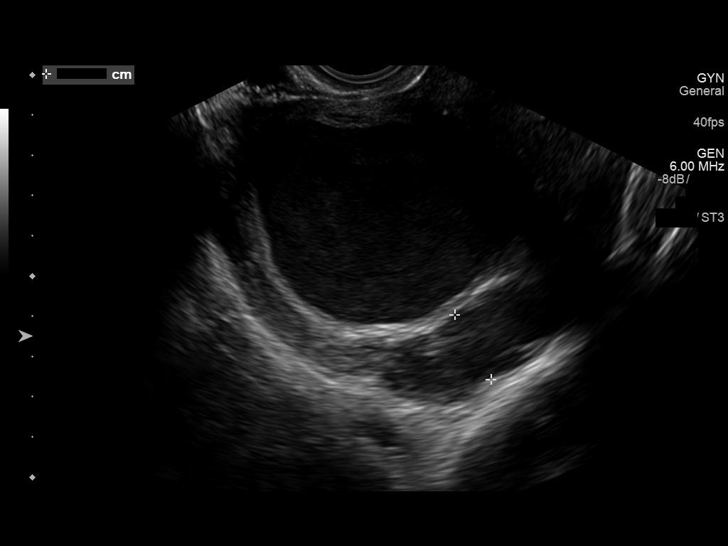
[im 77/93]
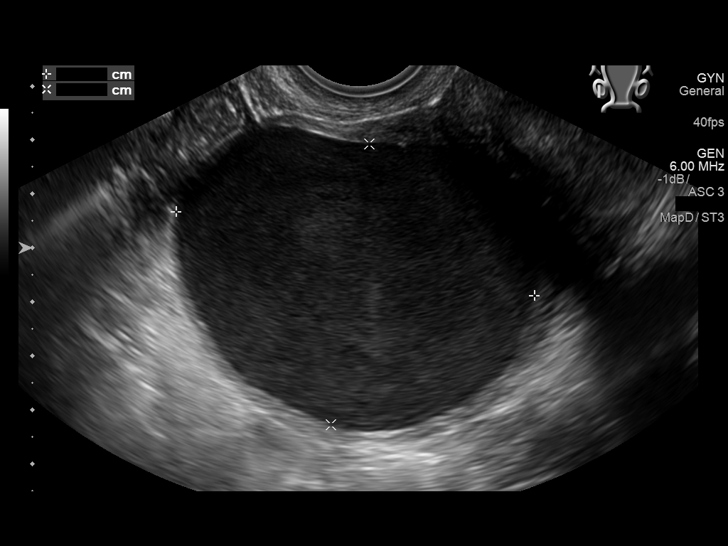
[im 85/93]
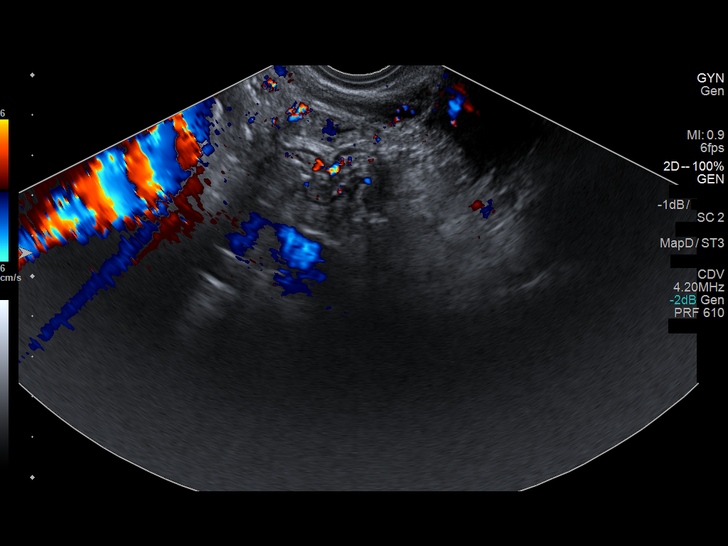
[im 93/93]
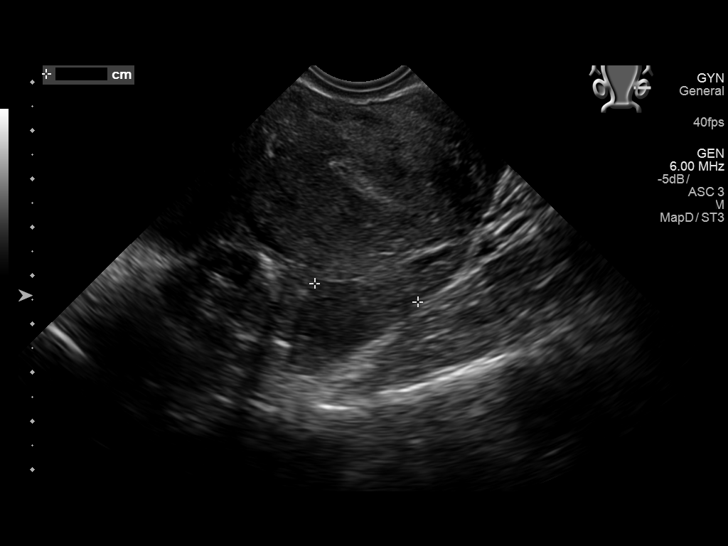

[13 of 25 positions shown; findings below may reference images not displayed]

FINDINGS: Uterus

Measurements: 7.2 x 3.0 x 4.4 cm.. No fibroids or other mass
visualized.

Endometrium

Thickness: 12 mm.  No focal abnormality visualized.

Right ovary

Measurements: 7.1 x 5.8 x 7.0 cm.. Large 6.8 cm cystic area is noted
emanating from the right ovary similar to that seen on recent CT
examination. Internal echogenicity consistent with debris is noted.
There is an adjacent area of dilatation with echogenicity within
similar to that seen on recent CT likely representing a dilated
fallopian tube. In the internal debris this likely represents a
pyosalpinx.

Left ovary

Measurements: 4.2 x 1.6 x 2.2 cm.. Normal appearance/no adnexal
mass.

Other findings

No abnormal free fluid.
IMPRESSION: Changes most consistent with a right tubo-ovarian abscess.

## 2018-10-24 DIAGNOSIS — N80519 Endometriosis of the rectum, unspecified depth: Secondary | ICD-10-CM | POA: Insufficient documentation

## 2018-11-20 HISTORY — PX: ABDOMINAL HYSTERECTOMY: SHX81

## 2018-11-20 HISTORY — PX: APPENDECTOMY: SHX54

## 2018-12-24 DIAGNOSIS — Z79890 Hormone replacement therapy (postmenopausal): Secondary | ICD-10-CM | POA: Insufficient documentation

## 2019-01-02 IMAGING — US US ART/VEN ABD/PELV/SCROTUM DOPPLER LTD
1 series · 13 of 25 positions shown · non-contrast
Comparison: 02/14/2017

CLINICAL DATA: Pelvic pain

EXAM:
TRANSABDOMINAL AND TRANSVAGINAL ULTRASOUND OF PELVIS
DOPPLER ULTRASOUND OF OVARIES
TECHNIQUE: Both transabdominal and transvaginal ultrasound examinations of the
pelvis were performed. Transabdominal technique was performed for
global imaging of the pelvis including uterus, ovaries, adnexal
regions, and pelvic cul-de-sac.
It was necessary to proceed with endovaginal exam following the
transabdominal exam to visualize the adnexa. Color and duplex
Doppler ultrasound was utilized to evaluate blood flow to the
ovaries.

[Series 1: us art/ven abd/pelv/scrotum doppler ltd · 0.24mm/px · 117 acquisitions, 13 frames shown]
[im 1/117]
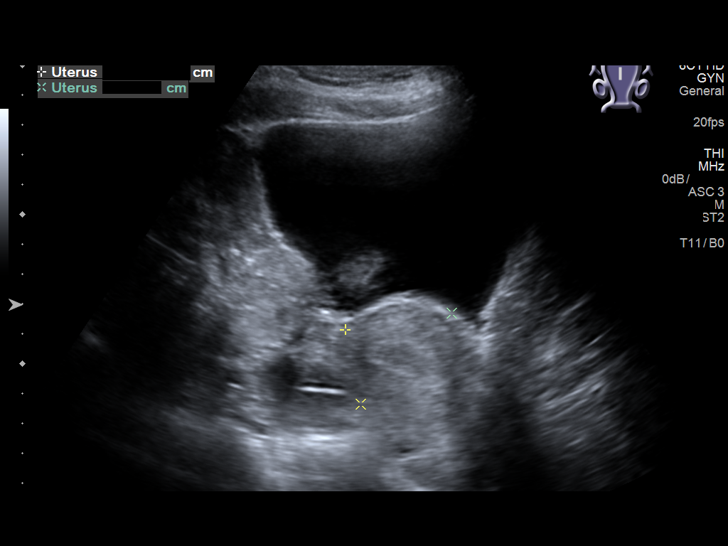
[im 10/117]
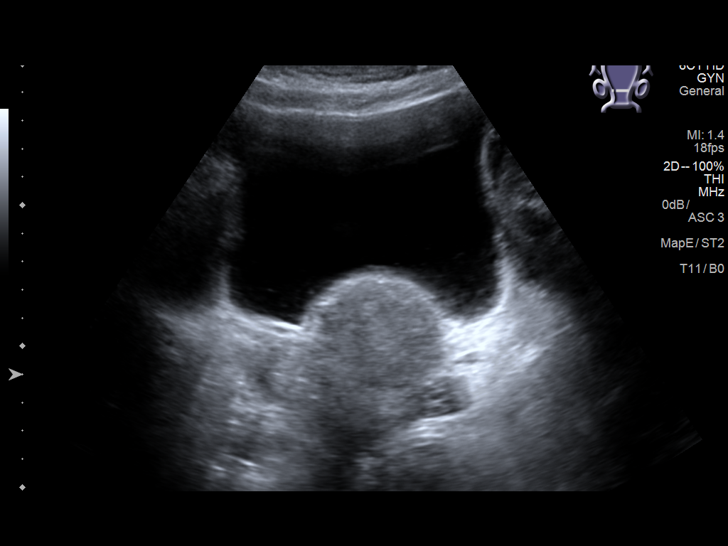
[im 20/117]
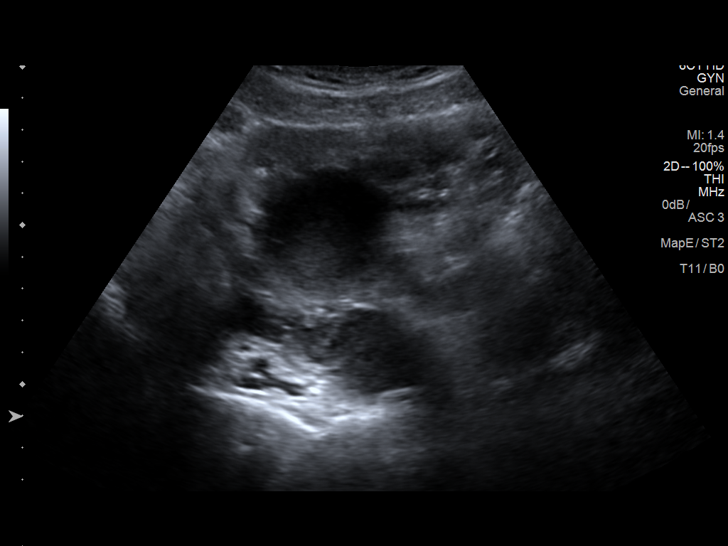
[im 30/117]
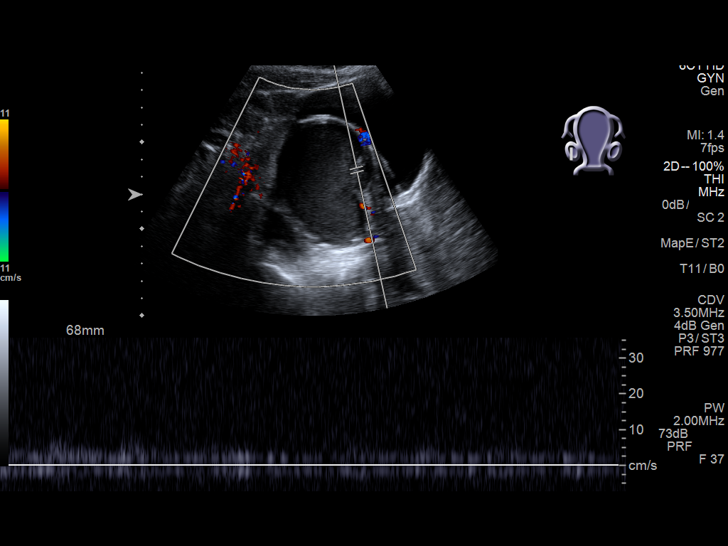
[im 39/117]
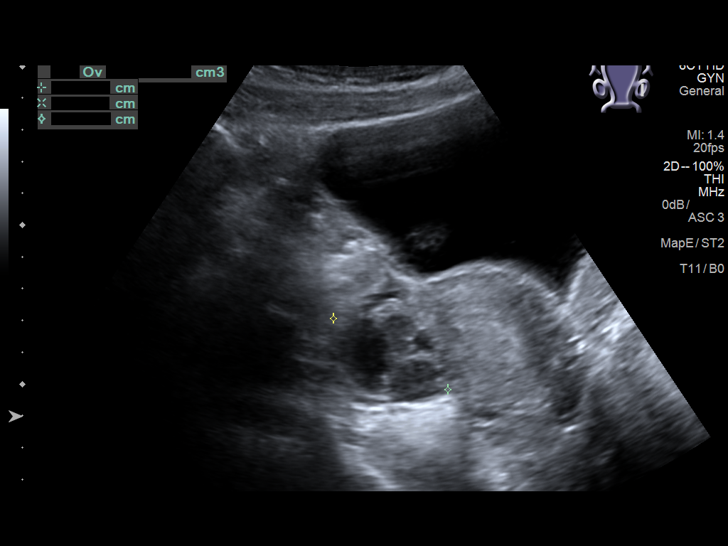
[im 49/117]
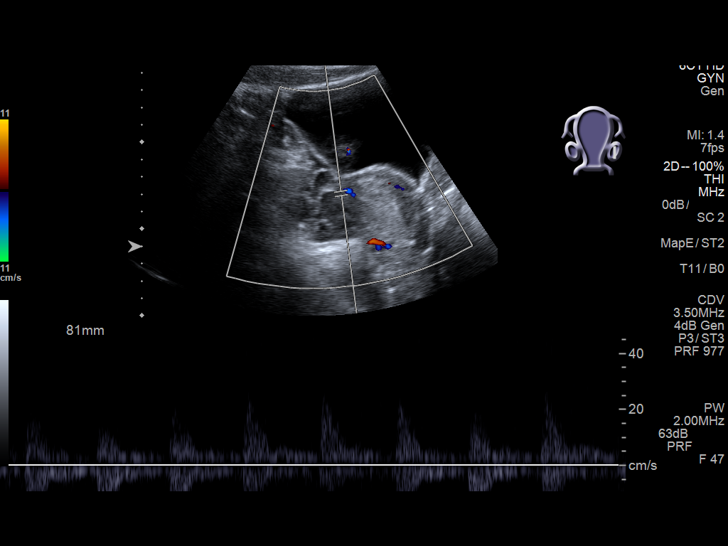
[im 59/117]
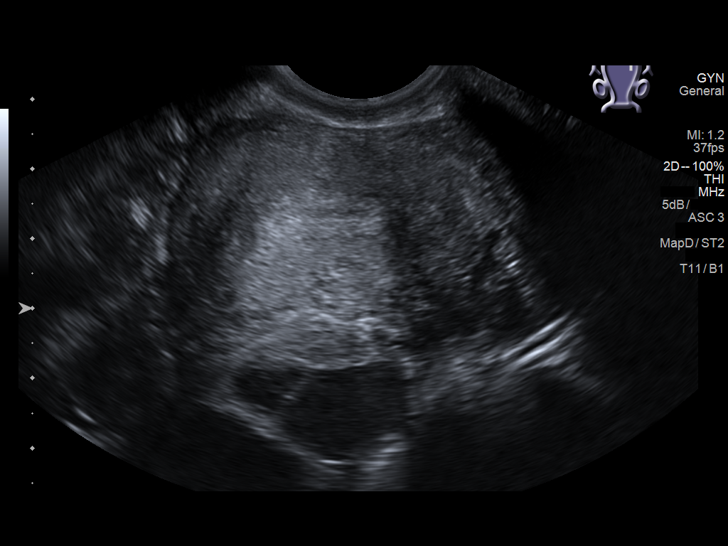
[im 68/117]
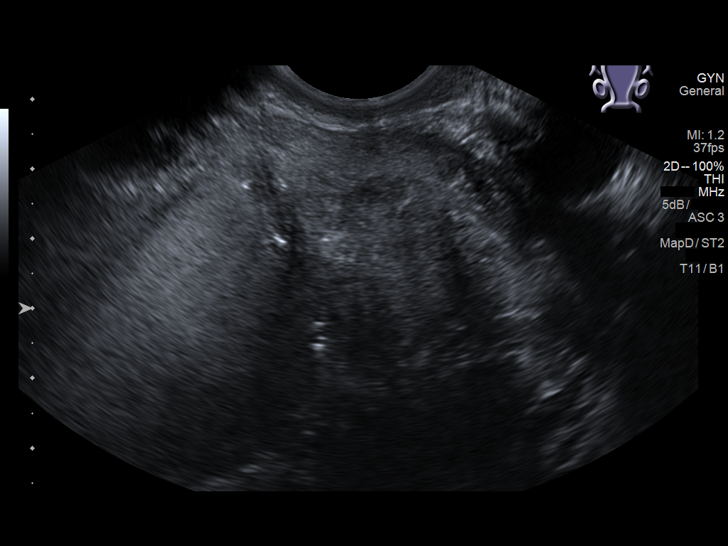
[im 78/117]
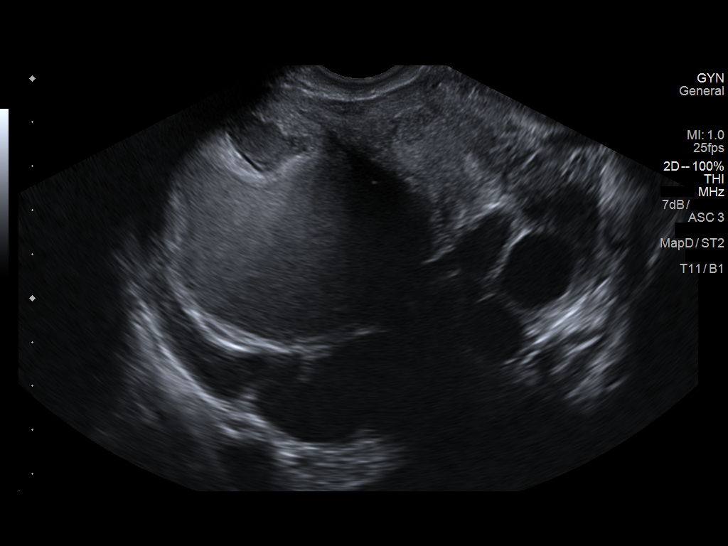
[im 88/117]
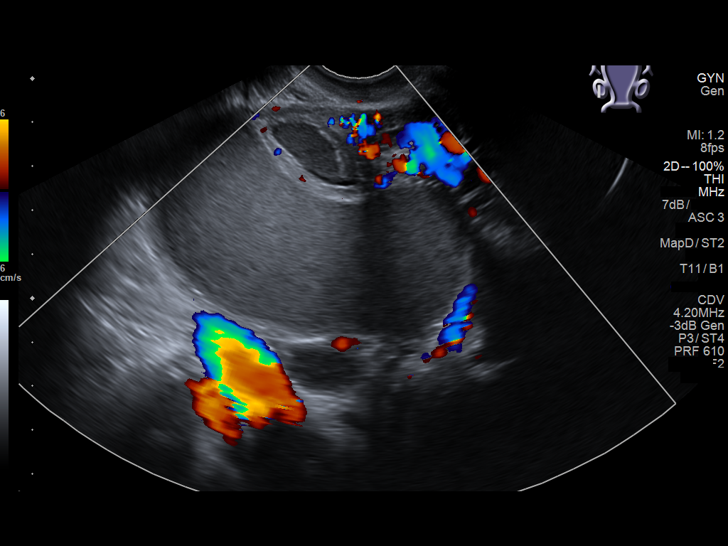
[im 97/117]
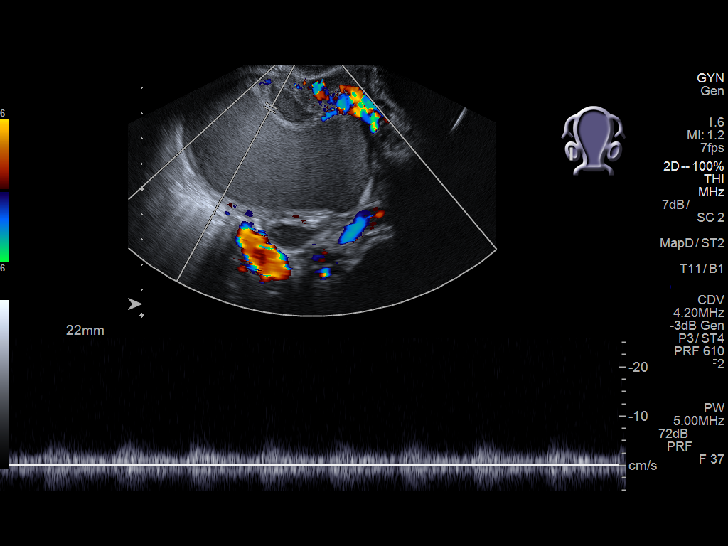
[im 107/117]
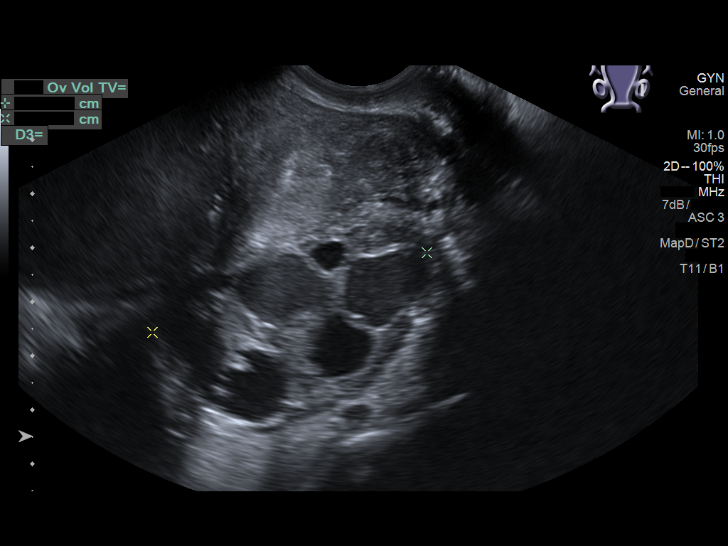
[im 117/117]
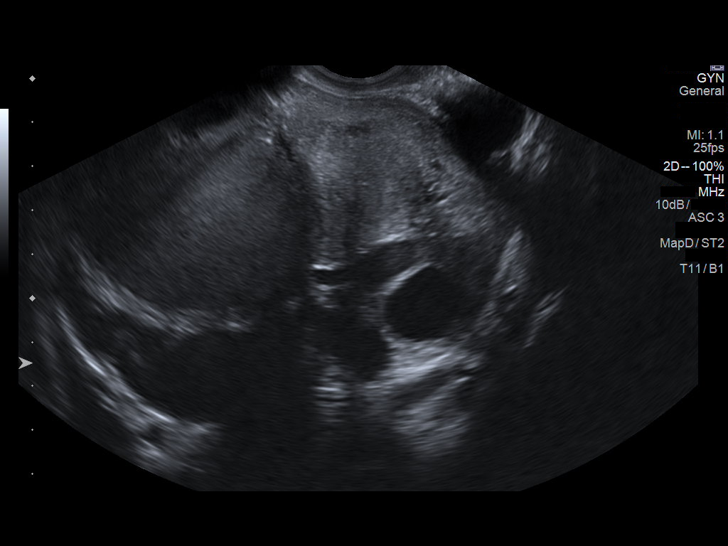

[13 of 25 positions shown; findings below may reference images not displayed]

FINDINGS: Uterus

Measurements: 7.2 x 4.3 x 5.9 cm. No fibroids or other mass
visualized.

Endometrium

Thickness: 11 mm in thickness.  No focal abnormality visualized.

Right ovary

Measurements: 7.8 x 8.2 x 7.7 cm. 8.1 x 4.7 x 6.6 cm homogeneous
hypoechoic mass noted with diffuse low level internal echoes, favor
endometrioma. Dilated tubular structure in the right adnexa, likely
hydrosalpinx.

Left ovary

Measurements: 3.7 x 5.3 x 3.2 cm. Small cystic areas within the left
ovary with internal echoes, likely small the right checks cysts.

Pulsed Doppler evaluation of both ovaries demonstrates normal
low-resistance arterial and venous waveforms.

Other findings

No abnormal free fluid.
IMPRESSION: 8.1 cm homogeneous low-density mass in the right adnexa with low
level internal echoes, most compatible with endometrioma.

Right hydrosalpinx.

Small cysts or follicles in the left ovary, some of which appear
hemorrhagic.

No evidence of ovarian torsion.

## 2019-05-17 IMAGING — CT CT ABD-PELV W/ CM
2 of 5 series · 16 of 46 positions shown, 18 images · IV contrast (APPLIED)
Comparison: None.

CLINICAL DATA: Acute onset RIGHT lower quadrant pain, nausea.
Assess for appendicitis.

EXAM:
CT ABDOMEN AND PELVIS WITH CONTRAST
TECHNIQUE: Multidetector CT imaging of the abdomen and pelvis was performed
using the standard protocol following bolus administration of
intravenous contrast.
CONTRAST:  100mL 0LAZWE-PAA IOPAMIDOL (0LAZWE-PAA) INJECTION 61%

[Series 2: routine abd/pel with · axial · 0.73mm/px · z∈[-498,-148]mm · 13 of 78 slices shown, 15 images]
[im 4/78  soft-tissue]
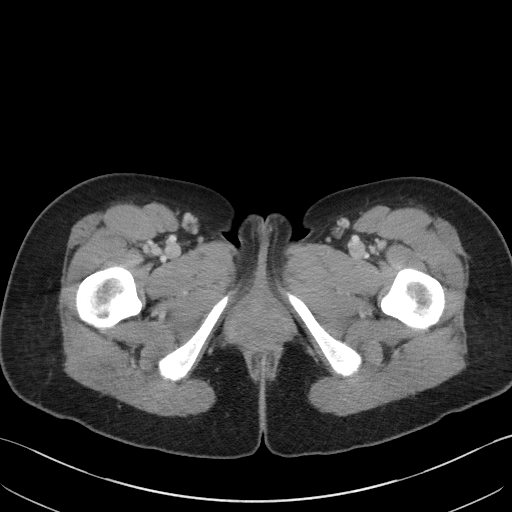
[im 4/78  bone]
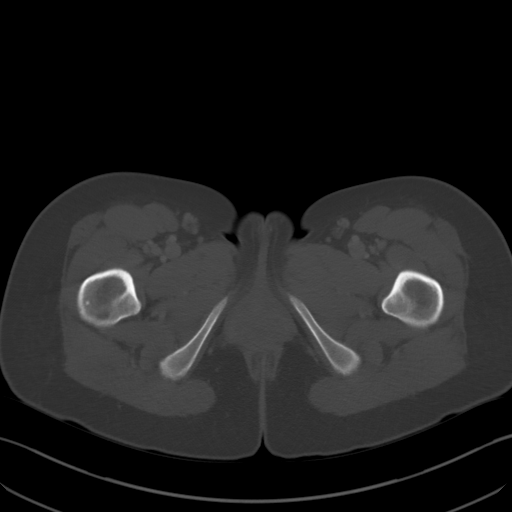
[im 12/78  soft-tissue]
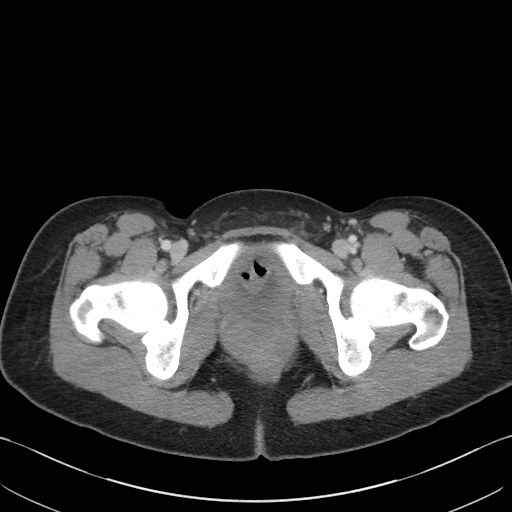
[im 16/78  soft-tissue]
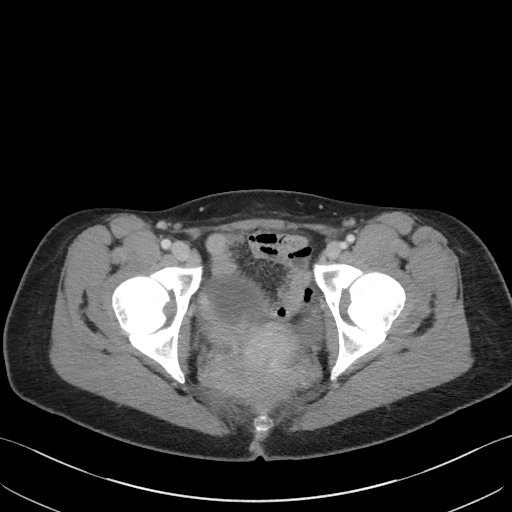
[im 24/78  soft-tissue]
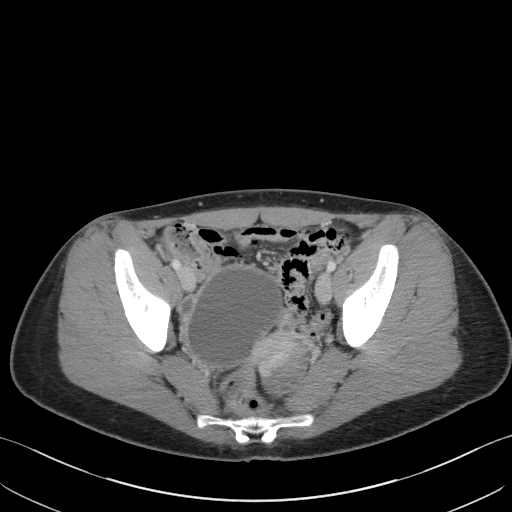
[im 27/78  soft-tissue]
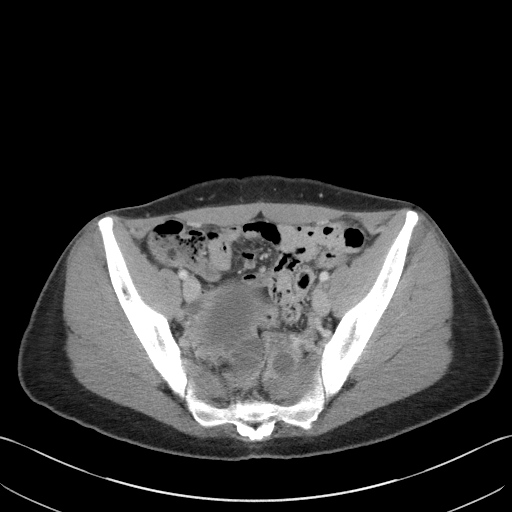
[im 35/78  soft-tissue]
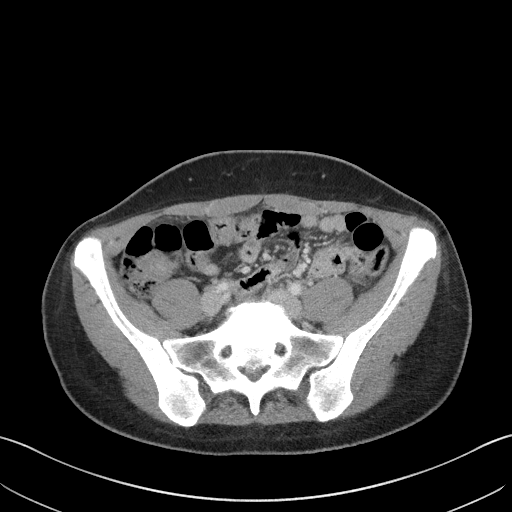
[im 39/78  soft-tissue]
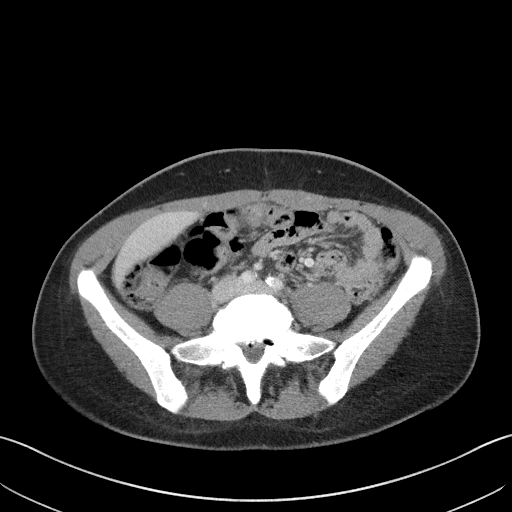
[im 43/78  soft-tissue]
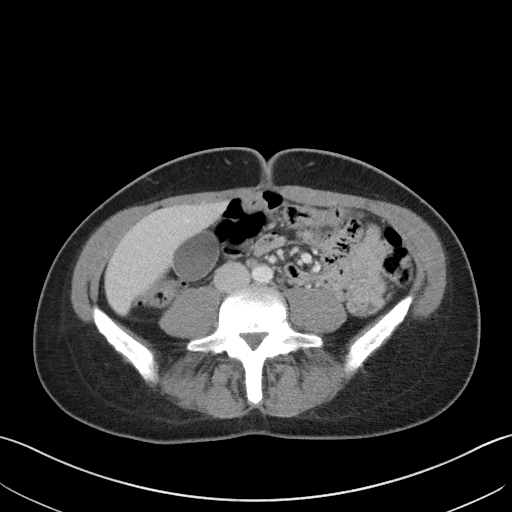
[im 51/78  soft-tissue]
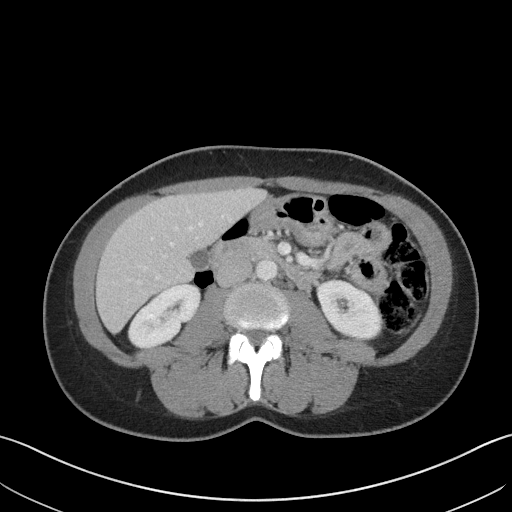
[im 51/78  bone]
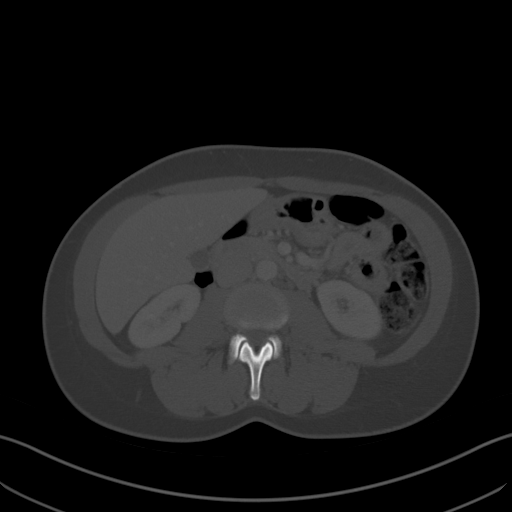
[im 54/78  soft-tissue]
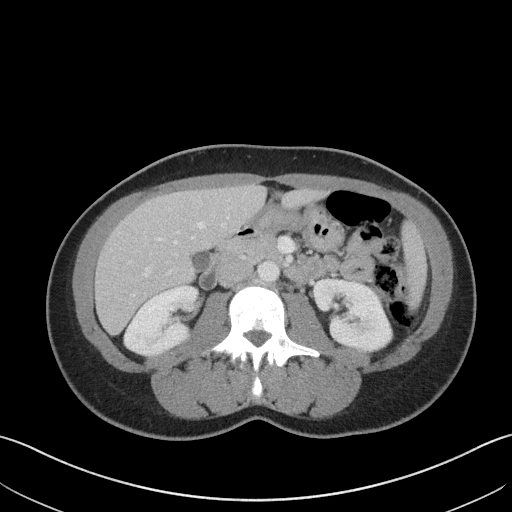
[im 62/78  soft-tissue]
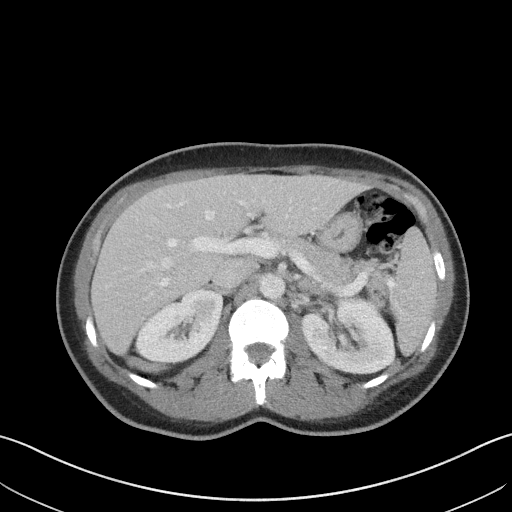
[im 66/78  soft-tissue]
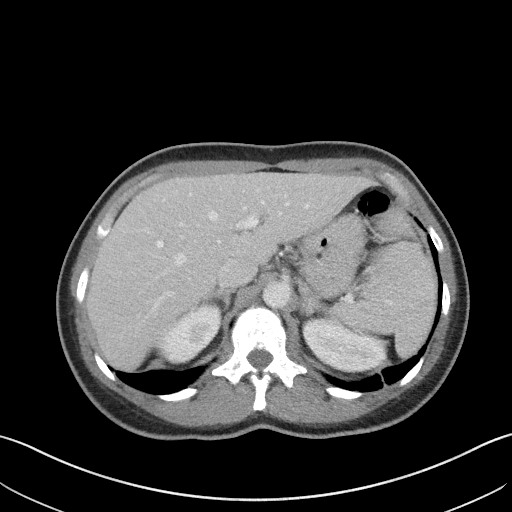
[im 74/78  soft-tissue]
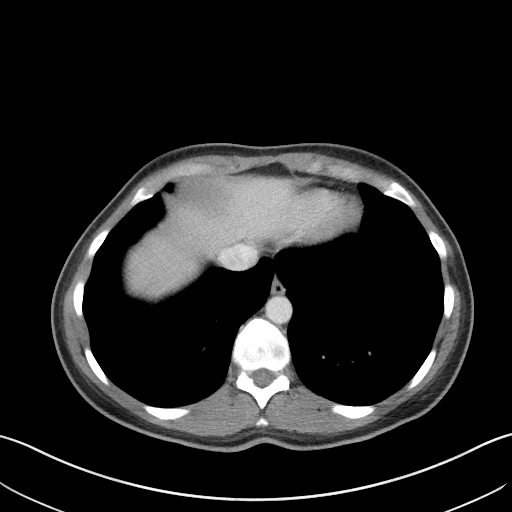

[Series 5: coronal st · coronal · 0.68mm/px · 3 of 72 slices shown]
[im 24/72  soft-tissue]
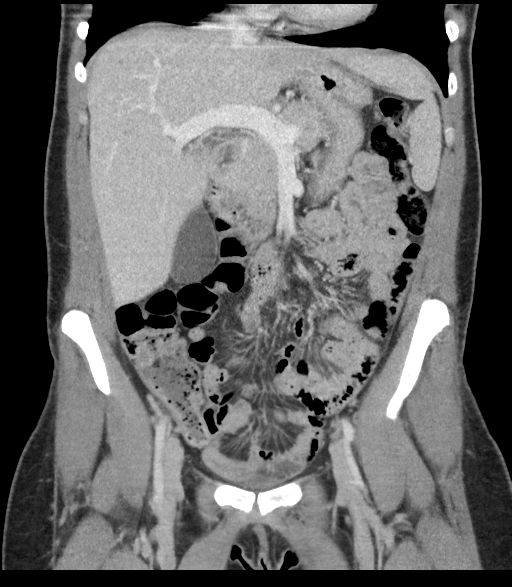
[im 32/72  soft-tissue]
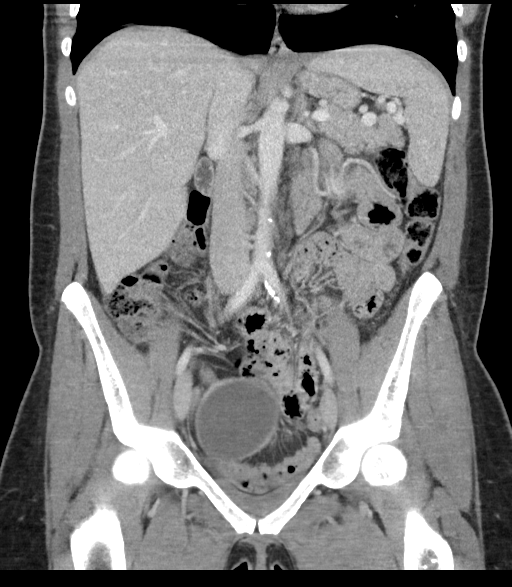
[im 40/72  soft-tissue]
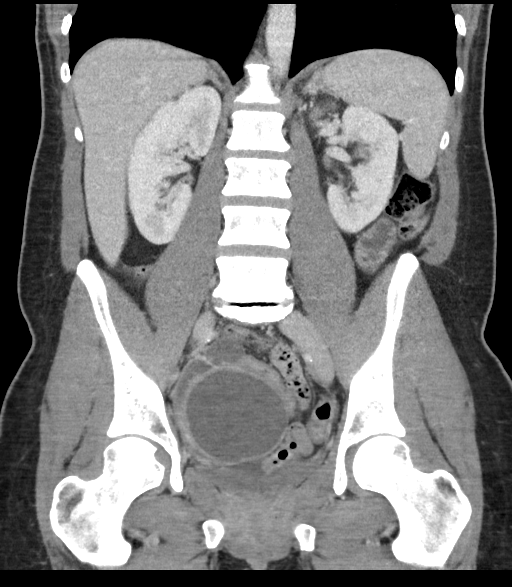

[16 of 46 positions shown; findings below may reference images not displayed]

FINDINGS: LOWER CHEST: Lung bases are clear. Included heart size is normal. No
pericardial effusion.

HEPATOBILIARY: Liver and gallbladder are normal.

PANCREAS: Normal.

SPLEEN: Normal.

ADRENALS/URINARY TRACT: Kidneys are orthotopic, demonstrating
symmetric enhancement. No nephrolithiasis, hydronephrosis or solid
renal masses. The unopacified ureters are normal in course and
caliber. Delayed imaging through the kidneys demonstrates symmetric
prompt contrast excretion within the proximal urinary collecting
system. Urinary bladder is partially distended and unremarkable.
Normal adrenal glands.

STOMACH/BOWEL: The stomach, small and large bowel are normal in
course and caliber without inflammatory changes. Normal appendix.

VASCULAR/LYMPHATIC: Aortoiliac vessels are normal in course and
caliber, mild calcific atherosclerosis. No lymphadenopathy by CT
size criteria.

REPRODUCTIVE: 8.1 x 5.9 cm eccentrically thick-walled RIGHT adnexal
cyst. Tubular fluid-filled 2.8 cm structure RIGHT lower quadrant.
Multiple follicles measuring to 8 mm LEFT adnexum.

OTHER: No intraperitoneal free fluid or free air.

MUSCULOSKELETAL: Grade 1 L5-S1 retrolisthesis with severe
degenerative disc with vacuum phenomena, mild canal stenosis and
moderate neural foraminal narrowing.
IMPRESSION: RIGHT adnexal 8.1 cm cyst versus abscess with ipsilateral hydro-
versus pyosalpinx. Recommend ultrasound and correlation with
gynecological examination.

Normal appendix.

## 2022-07-04 ENCOUNTER — Emergency Department: Payer: BC Managed Care – PPO

## 2022-07-04 ENCOUNTER — Emergency Department
Admission: EM | Admit: 2022-07-04 | Discharge: 2022-07-04 | Disposition: A | Discharge status: Home / Self Care | Payer: BC Managed Care – PPO | Attending: Student in an Organized Health Care Education/Training Program | Admitting: Student in an Organized Health Care Education/Training Program

## 2022-07-04 ENCOUNTER — Other Ambulatory Visit: Payer: Self-pay

## 2022-07-04 DIAGNOSIS — C062 Malignant neoplasm of retromolar area: Secondary | ICD-10-CM | POA: Insufficient documentation

## 2022-07-04 LAB — COMPREHENSIVE METABOLIC PANEL
ALT: 14 U/L (ref 0–44)
AST: 18 U/L (ref 15–41)
Albumin: 4.9 g/dL (ref 3.5–5.0)
Alkaline Phosphatase: 82 U/L (ref 38–126)
Anion gap: 7 (ref 5–15)
BUN: 6 mg/dL (ref 6–20)
CO2: 27 mmol/L (ref 22–32)
Calcium: 9.9 mg/dL (ref 8.9–10.3)
Chloride: 105 mmol/L (ref 98–111)
Creatinine, Ser: 0.6 mg/dL (ref 0.44–1.00)
GFR, Estimated: >60 mL/min (ref >60)
Glucose, Bld: 98 mg/dL (ref 70–99)
Potassium: 3.7 mmol/L (ref 3.5–5.1)
Sodium: 139 mmol/L (ref 135–145)
Total Bilirubin: 0.6 mg/dL (ref 0.3–1.2)
Total Protein: 8.2 g/dL — ABNORMAL HIGH (ref 6.5–8.1)

## 2022-07-04 LAB — CBC WITH DIFFERENTIAL/PLATELET
Abs Immature Granulocytes: 0.01 10*3/uL (ref 0.00–0.07)
Basophils Absolute: 0.1 10*3/uL (ref 0.0–0.1)
Basophils Relative: 1 %
Eosinophils Absolute: 0.1 10*3/uL (ref 0.0–0.5)
Eosinophils Relative: 1 %
HCT: 46.2 % — ABNORMAL HIGH (ref 36.0–46.0)
Hemoglobin: 15.4 g/dL — ABNORMAL HIGH (ref 12.0–15.0)
Immature Granulocytes: 0 %
Lymphocytes Relative: 33 %
Lymphs Abs: 3.3 10*3/uL (ref 0.7–4.0)
MCH: 30.3 pg (ref 26.0–34.0)
MCHC: 33.3 g/dL (ref 30.0–36.0)
MCV: 90.8 fL (ref 80.0–100.0)
Monocytes Absolute: 0.5 10*3/uL (ref 0.1–1.0)
Monocytes Relative: 5 %
Neutro Abs: 6 10*3/uL (ref 1.7–7.7)
Neutrophils Relative %: 60 %
Platelets: 292 10*3/uL (ref 150–400)
RBC: 5.09 MIL/uL (ref 3.87–5.11)
RDW: 13.9 % (ref 11.5–15.5)
WBC: 10 10*3/uL (ref 4.0–10.5)
nRBC: 0 % (ref 0.0–0.2)

## 2022-07-04 LAB — LACTIC ACID, PLASMA: Lactic Acid, Venous: 1.1 mmol/L (ref 0.5–1.9)

## 2022-07-04 MED ORDER — IOHEXOL 300 MG/ML  SOLN
75.0000 mL | Freq: Once | INTRAMUSCULAR | Status: AC | PRN
  Administered 2022-07-04: 75 mL via INTRAVENOUS

## 2022-07-04 MED ORDER — NAPROXEN 500 MG PO TABS: 500.0000 mg | ORAL_TABLET | Freq: Two times a day (BID) | ORAL | 2 refills | Status: DC

## 2022-07-04 NOTE — ED Triage Notes (Signed)
Pt to ED POV after spending all day at [facility]. PT has dentist appt tomorrow but is here for possible dental abscess to L lower mouth, states has been draning, hx of same. L ear is at times painful, congested. Pain to L throat. Cannot open mouth more than 2cm. This has been ongoing for months.

## 2022-07-04 NOTE — Discharge Instructions (Signed)
As we discussed, your CT scan is concerning for a possible squamous cell carcinoma which is causing your symptoms today.  You may take the medication as prescribed to help with your pain.  Your case was discussed with the ENT doctor on-call who would like you to call his office first thing in the morning to arrange outpatient follow-up including a biopsy and further management of this problem.  In the meantime, please return to the emergency department immediately if you develop inability to swallow, trouble breathing, or any other concerns.  It was a pleasure caring for you today.

## 2022-07-04 NOTE — ED Provider Triage Note (Signed)
  Emergency Medicine Provider Triage Evaluation Note  Holly Vega , a 46 y.o.female,  was evaluated in triage.  Pt complains of jaw pain.  She states that she is unable to open her mouth more than 1 cm.  Endorses pain along the left side of the jaw.  Reports that this been going on for months.  Denies any other symptoms at this time.   Review of Systems  Positive: Jaw pain Negative: Denies fever, chest pain, vomiting  Physical Exam  There were no vitals filed for this visit. Gen:   Awake, no distress   Resp:  Normal effort  MSK:   Moves extremities without difficulty  Other:  Patient currently wearing a mask.  Did not want to have her face/jaw examined in the waiting room.  Medical Decision Making  Given the patient's initial medical screening exam, the following diagnostic evaluation has been ordered. The patient will be placed in the appropriate treatment space, once one is available, to complete the evaluation and treatment. I have discussed the plan of care with the patient and I have advised the patient that an ED physician or mid-level practitioner will reevaluate their condition after the test results have been received, as the results may give them additional insight into the type of treatment they may need.    Diagnostics: None immediately.  Treatments: none immediately   Teodoro Spray, [place] 07/04/22 1623

## 2022-07-04 NOTE — ED Provider Notes (Signed)
[facility] Provider Note    Event Date/Time   First MD Initiated Contact with Patient 07/04/22 1728     (approximate)   History   abscess and cannot open mouth   HPI  Holly Vega is a 46 y.o. female who presents today for evaluation of dental pain and trouble opening her mouth for the past 4 months.  Patient reports that she has a multitude of dental problems and she is seeing a Theatre stage manager.  However she reports that she had what she thought was an abscess to her left lower lateral tooth, and she thinks that this has opened up because she tastes something bad.  She denies any trouble swallowing.  She has not had any trouble breathing.  She denies any pain in her neck.  She reports that she had her wisdom teeth removed several years ago and has had trouble opening her mouth ever since then.  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 --      Excl. in [place]? --     Most recent vital signs: Vitals:   07/04/22 1644  BP: (!) 166/91  Pulse: 79  Resp: 14  Temp: 97.9 F (36.6 C)  SpO2: 97%    Physical Exam Vitals and nursing note reviewed.  Constitutional:      General: Awake and alert. No acute distress.    Appearance: Normal appearance. The patient is normal weight.  HENT:     Head: Normocephalic  and atraumatic.     Mouth: Mucous membranes are moist.  Poor dentition diffusely.  Uvula midline.  1 finger trismus present. No sublingual tenderness or woodiness or fluctuance.  No gingival fluctuance.  Obvious and diffuse dental decay.  No facial or neck swelling or erythema.  No lymphadenopathy palpated.  No nuchal rigidity.  No drooling Eyes:     General: PERRL. Normal EOMs        Right eye: No discharge.        Left eye: No discharge.     Conjunctiva/sclera: Conjunctivae normal.  Cardiovascular:     Rate and Rhythm: Normal rate and regular rhythm.     Pulses: Normal pulses.     Heart sounds: Normal heart sounds Pulmonary:     Effort: Pulmonary effort is normal. No respiratory distress.     Breath sounds: Normal breath sounds.  Abdominal:     Abdomen is soft. There is no abdominal tenderness. No rebound or guarding. No distention. Musculoskeletal:        General: No  swelling. Normal range of motion.     Cervical back: Normal range of motion and neck supple.  No nuchal rigidity Skin:    General: Skin is warm and dry.     Capillary Refill: Capillary refill takes less than 2 seconds.     Findings: No rash.  Neurological:     Mental Status: The patient is awake and alert.      ED Results / Procedures / Treatments   Labs (all labs ordered are listed, but only abnormal results are displayed) Labs Reviewed  COMPREHENSIVE METABOLIC PANEL - Abnormal; Notable for the following components:      Result Value   Total Protein 8.2 (*)    All other components within normal limits  CBC WITH DIFFERENTIAL/PLATELET - Abnormal; Notable for the following components:   Hemoglobin 15.4 (*)    HCT 46.2 (*)    All other components within normal limits  LACTIC ACID, PLASMA  LACTIC ACID, PLASMA     EKG     RADIOLOGY I independently reviewed and interpreted imaging and agree with radiologists findings.     PROCEDURES:  Critical Care performed:   Procedures   MEDICATIONS ORDERED  IN ED: Medications  iohexol (OMNIPAQUE) 300 MG/ML solution 75 mL (75 mLs Intravenous Contrast Given 07/04/22 1819)     IMPRESSION / MDM / ASSESSMENT AND PLAN / ED COURSE  I reviewed the triage vital signs and the nursing notes.   Differential diagnosis includes, but is not limited to, temporomandibular joint disease, dental abscess, deep space abscess, dental decay, dental caries.  Patient is awake and alert, hemodynamically stable and afebrile.  She has diffusely poor dentition with obvious dental decay.  No gingival swelling or fluctuance concerning for gingival abscess.  No nuchal rigidity, neck pain, hot potato voice, uvular deviation or malocclusion to suggest deep space infection. No sublingual swelling concerning for Ludwig's angina.   However, given her trismus, CT neck obtained for evaluation of possible deep space infection or abnormality.  I was called by the radiologist who has reported that patient has a 2.8 x 2.2 x 3.1 cm peripherally enhancing mass in the left retromolar trigone area with gas concerning for a primary squamous cell carcinoma with lymph nodes that appear to be concerning for metastatic disease.  I immediately discussed this with Dr. Kathyrn Sheriff with ENT on-call who feels that this can be managed as an outpatient, and does not feel that the patient needs to be admitted given that there are no airway concerns and she is able to eat and drink without difficulty. Secure chat was sent to him so that he has the patient's information as well. She has drinking water easily in the emergency department currently.  She has not had any drooling or trouble handling her own secretions.  I discussed these findings with the patient.  She understands the importance of prompt outpatient follow-up and agrees to call the ENT office tomorrow.  In the meantime, she was given naproxen for analgesia.  She was advised of the importance to stop smoking.  She understands return precautions.  Patient  understands and agrees with plan.  She was discharged in stable condition.   Patient's presentation is most consistent with acute presentation with potential threat to life or bodily function.  Clinical Course as of 07/04/22 1929  Mon Jul 04, 2022  1903 Received phone call from the radiologist.  It appears that the patient has a cancer [JP]  1909 Discussed with ENT on-call, Dr. Kathyrn Sheriff, who does  not feel that admission is indicated.  He has requested that the patient call his office tomorrow so that they can arrange outpatient follow-up and biopsy. 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   Emeline Gins 07/04/22 1929    Merlyn Lot, MD 07/04/22 2006

## 2022-07-15 ENCOUNTER — Encounter: Payer: Self-pay | Admitting: Otolaryngology

## 2022-07-21 ENCOUNTER — Encounter: Payer: Self-pay | Admitting: Otolaryngology

## 2022-07-21 ENCOUNTER — Ambulatory Visit: Payer: BC Managed Care – PPO | Admitting: Anesthesiology

## 2022-07-21 ENCOUNTER — Encounter
Admission: RE | Disposition: A | Discharge status: Home / Self Care | Payer: Self-pay | Source: Home / Self Care | Attending: Otolaryngology

## 2022-07-21 ENCOUNTER — Ambulatory Visit
Admission: RE | Admit: 2022-07-21 | Discharge: 2022-07-21 | Disposition: A | Discharge status: Home / Self Care | Payer: BC Managed Care – PPO | Attending: Otolaryngology | Admitting: Otolaryngology

## 2022-07-21 ENCOUNTER — Other Ambulatory Visit: Payer: Self-pay

## 2022-07-21 DIAGNOSIS — C01 Malignant neoplasm of base of tongue: Secondary | ICD-10-CM | POA: Diagnosis present

## 2022-07-21 DIAGNOSIS — Z87891 Personal history of nicotine dependence: Secondary | ICD-10-CM | POA: Insufficient documentation

## 2022-07-21 HISTORY — PX: TONGUE BIOPSY: SHX6575

## 2022-07-21 HISTORY — PX: DIRECT LARYNGOSCOPY: SHX5326

## 2022-07-21 HISTORY — DX: Failed or difficult intubation, initial encounter: T88.4XXA

## 2022-07-21 SURGERY — LARYNGOSCOPY, DIRECT
Anesthesia: General | Site: Throat

## 2022-07-21 MED ORDER — FENTANYL CITRATE PF 50 MCG/ML IJ SOSY
25.0000 ug | PREFILLED_SYRINGE | INTRAMUSCULAR | Status: DC | PRN
  Administered 2022-07-21 (×2): 25 ug via INTRAVENOUS

## 2022-07-21 MED ORDER — OXYCODONE HCL 5 MG PO TABS: 5.0000 mg | ORAL_TABLET | Freq: Once | ORAL | Status: AC | PRN

## 2022-07-21 MED ORDER — SUGAMMADEX SODIUM 200 MG/2ML IV SOLN
INTRAVENOUS | Status: DC | PRN
  Administered 2022-07-21: 200 mg via INTRAVENOUS

## 2022-07-21 MED ORDER — DEXAMETHASONE SODIUM PHOSPHATE 4 MG/ML IJ SOLN
INTRAMUSCULAR | Status: DC | PRN
  Administered 2022-07-21: 8 mg via INTRAVENOUS

## 2022-07-21 MED ORDER — OXYCODONE HCL 5 MG/5ML PO SOLN
5.0000 mg | Freq: Once | ORAL | Status: AC | PRN
  Administered 2022-07-21: 5 mg via ORAL

## 2022-07-21 MED ORDER — LACTATED RINGERS IV SOLN: INTRAVENOUS | Status: DC | PRN

## 2022-07-21 MED ORDER — PHENYLEPHRINE HCL 0.5 % NA SOLN
NASAL | Status: DC | PRN
  Administered 2022-07-21: 30 mL via OROMUCOSAL

## 2022-07-21 MED ORDER — ROCURONIUM BROMIDE 100 MG/10ML IV SOLN
INTRAVENOUS | Status: DC | PRN
  Administered 2022-07-21: 40 mg via INTRAVENOUS

## 2022-07-21 MED ORDER — LACTATED RINGERS IV SOLN: INTRAVENOUS | Status: DC

## 2022-07-21 MED ORDER — MIDAZOLAM HCL 5 MG/5ML IJ SOLN
INTRAMUSCULAR | Status: DC | PRN
  Administered 2022-07-21: 2 mg via INTRAVENOUS

## 2022-07-21 MED ORDER — LIDOCAINE HCL (CARDIAC) PF 100 MG/5ML IV SOSY
PREFILLED_SYRINGE | INTRAVENOUS | Status: DC | PRN
  Administered 2022-07-21: 60 mg via INTRAVENOUS

## 2022-07-21 MED ORDER — ONDANSETRON HCL 4 MG/2ML IJ SOLN
INTRAMUSCULAR | Status: DC | PRN
  Administered 2022-07-21: 4 mg via INTRAVENOUS

## 2022-07-21 MED ORDER — PROPOFOL 10 MG/ML IV BOLUS
INTRAVENOUS | Status: DC | PRN
  Administered 2022-07-21: 120 mg via INTRAVENOUS
  Administered 2022-07-21: 50 mg via INTRAVENOUS

## 2022-07-21 MED ORDER — HYDROCODONE-ACETAMINOPHEN 7.5-325 MG/15ML PO SOLN: 15.0000 mL | ORAL | 0 refills | Status: AC | PRN

## 2022-07-21 MED ORDER — FENTANYL CITRATE (PF) 100 MCG/2ML IJ SOLN
INTRAMUSCULAR | Status: DC | PRN
  Administered 2022-07-21 (×2): 50 ug via INTRAVENOUS

## 2022-07-21 SURGICAL SUPPLY — 13 items
BLOCK BITE GUARD (MISCELLANEOUS) ×1 IMPLANT
COVER MAYO STAND STRL (DRAPES) ×1 IMPLANT
COVER TABLE BACK 60X90 (DRAPES) ×1 IMPLANT
DRAPE SHEET LG 3/4 BI-LAMINATE (DRAPES) ×1 IMPLANT
DRSG TELFA 4X3 1S NADH ST (GAUZE/BANDAGES/DRESSINGS) ×1 IMPLANT
GLOVE SURG GAMMEX PI TX LF 7.5 (GLOVE) ×1 IMPLANT
KIT TURNOVER KIT A (KITS) ×1 IMPLANT
PATTIES SURGICAL .5 X.5 (GAUZE/BANDAGES/DRESSINGS) ×1 IMPLANT
SPONGE XRAY 4X4 16PLY STRL (MISCELLANEOUS) ×1 IMPLANT
STRAP BODY AND KNEE 60X3 (MISCELLANEOUS) ×1 IMPLANT
TOWEL OR 17X26 4PK STRL BLUE (TOWEL DISPOSABLE) ×1 IMPLANT
TUBING CONN 6MMX3.1M (TUBING) ×1
TUBING SUCTION CONN 0.25 STRL (TUBING) ×1 IMPLANT

## 2022-07-21 NOTE — Anesthesia Procedure Notes (Signed)
Procedure Name: Intubation Date/Time: 07/21/2022 11:50 AM  Performed by: Tobie Poet, CRNAPre-anesthesia Checklist: Patient identified, Emergency Drugs available, Suction available and Patient being monitored Patient Re-evaluated:Patient Re-evaluated prior to induction Oxygen Delivery Method: Circle system utilized Preoxygenation: Pre-oxygenation with 100% oxygen Induction Type: IV induction Ventilation: Mask ventilation without difficulty Laryngoscope Size: Glidescope and 3 Grade View: Grade I Tube type: Oral Tube size: 6.5 mm Number of attempts: 1 Airway Equipment and Method: Stylet Placement Confirmation: ETT inserted through vocal cords under direct vision, positive ETCO2 and breath sounds checked- equal and bilateral Secured at: 20 cm Tube secured with: Tape Dental Injury: Teeth and Oropharynx as per pre-operative assessment  Comments: Patient with limited mouth opening. Molt retractor inserted after induction by surgeon, able to easily pass glidescope blade after

## 2022-07-21 NOTE — Anesthesia Postprocedure Evaluation (Signed)
Anesthesia Post Note  Patient: Holly Vega  Procedure(s) Performed: DIRECT MICROLARYNGOSCOPY (Throat) TONGUE BASE BIOPSY (Throat)  Patient location during evaluation: PACU Anesthesia Type: General Level of consciousness: awake and alert Pain management: pain level controlled Vital Signs Assessment: post-procedure vital signs reviewed and stable Respiratory status: spontaneous breathing, nonlabored ventilation, respiratory function stable and patient connected to nasal cannula oxygen Cardiovascular status: blood pressure returned to baseline and stable Postop Assessment: no apparent nausea or vomiting Anesthetic complications: no   No notable events documented.   Last Vitals:  Vitals:   07/21/22 1300 07/21/22 1315  BP: (!) 159/79 (!) 153/75  Pulse: 69 70  Resp: 12 12  Temp:  (!) 36.2 C  SpO2: 98% 96%    Last Pain:  Vitals:   07/21/22 1315  PainSc: 5                  Holly Vega

## 2022-07-21 NOTE — Transfer of Care (Signed)
Immediate Anesthesia Transfer of Care Note  Patient: Holly Vega  Procedure(s) Performed: DIRECT MICROLARYNGOSCOPY (Throat) TONGUE BASE BIOPSY (Throat)  Patient Location: PACU  Anesthesia Type: General ETT  Level of Consciousness: awake, alert  and patient cooperative  Airway and Oxygen Therapy: Patient Spontanous Breathing and Patient connected to supplemental oxygen  Post-op Assessment: Post-op Vital signs reviewed, Patient's Cardiovascular Status Stable, Respiratory Function Stable, Patent Airway and No signs of Nausea or vomiting  Post-op Vital Signs: Reviewed and stable  Complications: No notable events documented.

## 2022-07-21 NOTE — H&P (Signed)
H&P has been reviewed and patient reevaluated, no changes necessary. To be downloaded later.  

## 2022-07-21 NOTE — Op Note (Signed)
07/21/2022  12:19 PM    Holly Vega  [id number]   Pre-Op Dx: Trismus, left tongue base mass  Post-op Dx: Trismus, left tongue base mass with in the left lateral tongue base and muscle but not involving mucosa  Proc: Direct microlaryngoscopy with biopsy of left tongue base  Surg:  [facility]  Anes:  GOT  EBL: 30 mL  Comp: There is severe trismus made it very difficult to evaluate, but there were no operative complications  Findings: Once I can get the mouth open to visualize this area the mucosa of the tongue base was completely normal.  There is mild watery edema of the mucosal tissue around the retromolar trigone but no ulcerations or signs of tumor.  I could feel a 3 cm mass in the left tongue base that was very firm and stuck to the muscle along the inside of the bottom of the mandible.  Through digital palpation a small groove was opened up between the mass and the mandible area.  Groove went down alongside the medial portion of the tumor.  I was using a endoscope to visualize the areas and still could not see obvious tumor but I could feel it inside the small fissure that was created.  I was able to use a cup biting forcep down into this area and removed some tissue from the tongue base underneath the mucosa.  This appeared to be granular in nature.  Another biopsy was taken although this appeared to be a little more like muscle fiber.  It had a little bit of granulation.  I was concerned that this is right where the lingual artery and nerve come in and I do not want to accidentally catch those with a biopsy.  I feel I had enough tissue with the first biopsy to verify if this is cancer and then decide what is the most appropriate treatment.  Procedure: The patient was brought to the operating room and placed in supine position.  She was very tight with her trismus and could barely get her mouth open much.  She was given general anesthesia by mask and was easily bagged with her  mouth closed.  She was given muscle relaxants and we are able to open up her mouth a little bit wider.  I used a Molt retractor to her mouth wider.  This was used on her left side where she had loss of her upper molars already so would not cause damage to any teeth.  I was able to slowly continue to stretch this a little bit farther until I can get her mouth open wide enough that the patient could be orally intubated using a glide scope.  With the patient intubated and airway secured.  I left a Molt retractor in so I could see and feel this area.  When I put a finger back there I can feel a very firm mass in the left tongue base that was about 3 cm in size and seemingly adherent to the lateral pharyngeal wall and inside of the bottom of the mandible.  When I put a scope then to visualize the area behind using an endoscope to magnify this area the mucosa of the tongue base look perfectly normal.  There is no sign of any ulcerations or lesions of the mucosa.  The palate felt a little bit firmer in the area but did not seem to have a distinct mass.  As I continued to look and palpate in the area I  was able to create a small fissure between the tongue base and the lateral wall near the base of the jaw.  I was able to widen this a little bit further and could feel granular tissue on the tongue side.  I used the scope to visualize into this area and was difficult to see.  I could not make out specific tumor.  I decided to use a cup biting forceps and take some bites on the tongue side of the fissure to try to get some of this tumor without having to go through normal mucosa on the posterior tongue base.  The first bite gave a piece of very granular tissue.  I took a second bite and got what seemed to be some granular with muscle tissue.  She is doing some bleeding in this area and is concerned of the lingual artery and lingual nerve coming through this area and not wanting to traumatize them.  I used some cotton  pledgets soaked in phenylephrine for vasoconstriction in this area and appeared as though the bleeding all stopped.  I looked at my specimens and they were granular and I felt I had another the get some pathologic diagnosis to help [place] delineate what the problem was.  She had severe trismus that I could only get open with the Molt retractor.  Once I took this out then her jaw moves much better than it did before and she can get her mouth much more open than before.  Patient was turned back over to anesthesia.  She tolerated the procedure well.  There were no operative complications.  She was taken to the recovery room in satisfactory condition this was a very difficult case to try to sort out what is causing her problems and what is the etiology.  Dispo:   To PACU to be discharged home  Plan: To follow-up in the office next week to go over the path report and decide upon a treatment plan.  She will use full liquids at home as she has been on that previously because of the trismus.  She should keep trying to open her mouth to keep some mobility at this job now that it is released from the severe trismus  Huey Romans  07/21/2022 12:19 PM

## 2022-07-21 NOTE — Anesthesia Preprocedure Evaluation (Addendum)
Anesthesia Evaluation  Patient identified by MRN, date of birth, ID band Patient awake    Reviewed: Allergy & Precautions, NPO status , Patient's Chart, lab work & pertinent test results  History of Anesthesia Complications Negative for: history of anesthetic complications  Airway Mallampati: IV  TM Distance: >3 FB Neck ROM: full  Mouth opening: Limited Mouth Opening  Dental  (+) Chipped, Poor Dentition, Missing, Loose   Pulmonary neg pulmonary ROS, neg shortness of breath, former smoker,    Pulmonary exam normal        Cardiovascular Exercise Tolerance: Good (-) angina(-) DOE negative cardio ROS Normal cardiovascular exam     Neuro/Psych negative neurological ROS  negative psych ROS   GI/Hepatic negative GI ROS, Neg liver ROS, neg GERD  ,  Endo/Other  negative endocrine ROS  Renal/GU      Musculoskeletal   Abdominal   Peds  Hematology negative hematology ROS (+)   Anesthesia Other Findings Past Medical History: No date: Depression No date: Endometriosis  Past Surgical History: 11/20/2018: ABDOMINAL HYSTERECTOMY     Comment:  [facility] 11/20/2018: APPENDECTOMY     Comment:  [facility] 01/25/2018: LAPAROSCOPY     Comment:  Procedure: LAPAROSCOPY DIAGNOSTIC;  Surgeon: Will Bonnet, MD;  Location: [facility];  Service:               Gynecology;;  BMI    Body Mass Index: 23.34 kg/m      Reproductive/Obstetrics negative OB ROS                            Anesthesia Physical Anesthesia Plan  ASA: 3  Anesthesia Plan: General ETT   Post-op Pain Management:    Induction: Intravenous  PONV Risk Score and Plan: Ondansetron, Dexamethasone, Midazolam and Treatment may vary due to age or medical condition  Airway Management Planned: Oral ETT and Video Laryngoscope Planned  Additional Equipment:   Intra-op Plan:   Post-operative Plan: Extubation in OR  Informed Consent:  I have reviewed the patients History and Physical, chart, labs and discussed the procedure including the risks, benefits and alternatives for the proposed anesthesia with the patient or authorized representative who has indicated his/her understanding and acceptance.     Dental Advisory Given  Plan Discussed with: Anesthesiologist, CRNA and Surgeon  Anesthesia Plan Comments: (Patient consented for risks of anesthesia including but not limited to:  - adverse reactions to medications - damage to eyes, teeth, lips or other oral mucosa - nerve damage due to positioning  - sore throat or hoarseness - Damage to heart, brain, nerves, lungs, other parts of body or loss of life  Patient voiced understanding.)        Anesthesia Quick Evaluation

## 2022-07-22 ENCOUNTER — Encounter: Payer: Self-pay | Admitting: Otolaryngology

## 2022-07-25 LAB — SURGICAL PATHOLOGY

## 2022-07-28 ENCOUNTER — Other Ambulatory Visit: Payer: Self-pay | Admitting: Otolaryngology

## 2022-07-28 DIAGNOSIS — C029 Malignant neoplasm of tongue, unspecified: Secondary | ICD-10-CM

## 2022-08-05 ENCOUNTER — Encounter: Payer: Self-pay | Admitting: Oncology

## 2022-08-05 ENCOUNTER — Inpatient Hospital Stay: Payer: BC Managed Care – PPO

## 2022-08-05 ENCOUNTER — Inpatient Hospital Stay: Payer: BC Managed Care – PPO | Attending: Oncology | Admitting: Oncology

## 2022-08-05 VITALS — BP 154/72 | HR 71 | Temp 97.0°F | Resp 17 | Ht 67.7 in | Wt 151.6 lb

## 2022-08-05 DIAGNOSIS — C029 Malignant neoplasm of tongue, unspecified: Secondary | ICD-10-CM | POA: Diagnosis not present

## 2022-08-05 DIAGNOSIS — R52 Pain, unspecified: Secondary | ICD-10-CM | POA: Diagnosis not present

## 2022-08-05 NOTE — Progress Notes (Unsigned)
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  INTERVAL HISTORY: Patient is a 46 year old female who noticed pain and swelling in the left side of her neck.  Subsequent work-up included CT scan and biopsy revealing the above-stated malignancy.  Patient states she was also placed on antibiotics and the lymph node in her neck has decreased in size since that time.  She otherwise feels well.  She has no neurologic complaints.  She denies any recent fevers or illnesses.  She has good appetite and denies weight loss.  She has no chest pain, shortness of breath, cough, or hemoptysis.  She denies any nausea, vomiting, constipation, or diarrhea.  She has no urinary complaints.  Patient offers no further specific complaints today.  REVIEW OF SYSTEMS:   Review of Systems  Constitutional: Negative.  Negative for fever, malaise/fatigue and weight loss.  HENT:  Negative for sore throat.   Respiratory: Negative.  Negative for cough, hemoptysis and shortness of breath.   Cardiovascular: Negative.  Negative for chest pain and leg swelling.  Gastrointestinal: Negative.  Negative for abdominal pain.  Genitourinary: Negative.  Negative for dysuria.  Musculoskeletal: Negative.  Negative for neck pain.  Skin: Negative.  Negative for rash.  Neurological: Negative.  Negative for dizziness, focal weakness, weakness and headaches.  Psychiatric/Behavioral: Negative.  The patient is not nervous/anxious.     As per HPI. Otherwise, a complete review of systems is negative.  PAST MEDICAL HISTORY: Past Medical History:  Diagnosis Date   Depression    Difficult intubation    Endometriosis     PAST SURGICAL HISTORY: Past Surgical History:  Procedure  Laterality Date   ABDOMINAL HYSTERECTOMY  11/20/2018   [facility]   APPENDECTOMY  11/20/2018   [facility]   DIRECT LARYNGOSCOPY N/A 07/21/2022   Procedure: DIRECT MICROLARYNGOSCOPY;  Surgeon: Margaretha Sheffield, MD;  Location: [place];  Service: ENT;  Laterality: N/A;   LAPAROSCOPY  01/25/2018   Procedure: LAPAROSCOPY DIAGNOSTIC;  Surgeon: Will Bonnet, MD;  Location: [facility];  Service: Gynecology;;   TONGUE BIOPSY N/A 07/21/2022   Procedure: TONGUE BASE BIOPSY;  Surgeon: Margaretha Sheffield, MD;  Location: [place];  Service: ENT;  Laterality: N/A;    FAMILY HISTORY: Family History  Problem Relation Age of Onset   Cancer Maternal Uncle     ADVANCED DIRECTIVES (Y/N):  N  HEALTH MAINTENANCE: Social History   Tobacco Use   Smoking status: Former    Packs/day: 0.75    Years: 30.00    Total pack years: 22.50    Types: Cigarettes    Quit date: 07/05/2022    Years since quitting: 0.0   Smokeless tobacco: Never   Tobacco comments:       Vaping Use   Vaping Use: Never used  Substance Use Topics   Alcohol use: No   Drug use: No     Colonoscopy:  PAP:  Bone density:  Lipid panel:  Allergies  Allergen Reactions   Chlorhexidine Gluconate Rash    Current Outpatient Medications  Medication Sig Dispense Refill   naproxen (NAPROSYN) 500 MG tablet Take 1 tablet (500 mg total) by mouth 2 (two) times daily with a meal. 10 tablet 2   No current facility-administered medications for this visit.  OBJECTIVE: Vitals:   08/05/22 0843  BP: (!) 154/72  Pulse: 71  Resp: 17  Temp: (!) 97 F (36.1 C)  SpO2: 100%     Body mass index is 23.26 kg/m.    ECOG FS:0 - Asymptomatic  General: Well-developed, well-nourished, no acute distress. Eyes: Pink conjunctiva, anicteric sclera. HEENT: Normocephalic, moist mucous membranes.  No palpable lymphadenopathy. Lungs: No audible wheezing or coughing. Heart: Regular rate and rhythm. Abdomen: Soft, nontender, no obvious  distention. Musculoskeletal: No edema, cyanosis, or clubbing. Neuro: Alert, answering all questions appropriately. Cranial nerves grossly intact. Skin: No rashes or petechiae noted. Psych: Normal affect. Lymphatics: No cervical, calvicular, axillary or inguinal LAD.   LAB RESULTS:  Lab Results  Component Value Date   NA 139 07/04/2022   K 3.7 07/04/2022   CL 105 07/04/2022   CO2 27 07/04/2022   GLUCOSE 98 07/04/2022   BUN 6 07/04/2022   CREATININE 0.60 07/04/2022   CALCIUM 9.9 07/04/2022   PROT 8.2 (H) 07/04/2022   ALBUMIN 4.9 07/04/2022   AST 18 07/04/2022   ALT 14 07/04/2022   ALKPHOS 82 07/04/2022   BILITOT 0.6 07/04/2022   GFRNONAA >60 07/04/2022   GFRAA >60 01/15/2018    Lab Results  Component Value Date   WBC 10.0 07/04/2022   NEUTROABS 6.0 07/04/2022   HGB 15.4 (H) 07/04/2022   HCT 46.2 (H) 07/04/2022   MCV 90.8 07/04/2022   PLT 292 07/04/2022     STUDIES: No results found.  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  It is possible lymph node seen on imaging was reactive, but patient has a PET scan next week which may be helpful.  Patient will definitely benefit from XRT.  Will determine the need of concurrent weekly cisplatin based on results of PET scan.  Return to clinic on August 15, 2022 at which time she will also have consultation with radiation oncology to discuss her PET scan results and treatment planning.  I spent a total of 60 minutes reviewing chart data, face-to-face evaluation with the patient, counseling and coordination of care as detailed above.   Patient expressed understanding and was in agreement with this plan. She also understands that She can call clinic at any time with any questions, concerns, or complaints.     Cancer Staging  Tongue cancer [facility]) Staging form: Oral Cavity, AJCC 8th Edition - Clinical stage from 08/05/2022: Stage III (cT3, cN1, cM0) - Signed by Lloyd Huger, MD on 08/05/2022 Stage prefix: Initial diagnosis   Lloyd Huger, MD   08/05/2022 12:54 PM

## 2022-08-05 NOTE — Progress Notes (Unsigned)
New pt for eval of tongue cancer. Has difficulty opening her mouth all the way. Doing mouth exercises. No pain with eating. Pain when trying to exercise her mouth would be a 4/10 on pain scale. No pain when not exercising.

## 2022-08-08 ENCOUNTER — Telehealth: Payer: Self-pay

## 2022-08-09 ENCOUNTER — Ambulatory Visit
Admission: RE | Admit: 2022-08-09 | Discharge: 2022-08-09 | Disposition: A | Discharge status: Home / Self Care | Payer: BC Managed Care – PPO | Source: Ambulatory Visit | Attending: Otolaryngology | Admitting: Otolaryngology

## 2022-08-09 DIAGNOSIS — C029 Malignant neoplasm of tongue, unspecified: Secondary | ICD-10-CM | POA: Diagnosis present

## 2022-08-09 LAB — GLUCOSE, CAPILLARY: Glucose-Capillary: 101 mg/dL — ABNORMAL HIGH (ref 70–99)

## 2022-08-09 MED ORDER — FLUDEOXYGLUCOSE F - 18 (FDG) INJECTION
7.8000 | Freq: Once | INTRAVENOUS | Status: AC | PRN
  Administered 2022-08-09: 8.56 via INTRAVENOUS

## 2022-08-10 NOTE — Telephone Encounter (Signed)
Entry made in error.  Jeral Fruit, RN 08/10/22 2:00 PM

## 2022-08-15 ENCOUNTER — Ambulatory Visit
Admission: RE | Admit: 2022-08-15 | Discharge: 2022-08-15 | Disposition: A | Discharge status: Home / Self Care | Payer: BC Managed Care – PPO | Source: Ambulatory Visit | Attending: Radiation Oncology | Admitting: Radiation Oncology

## 2022-08-15 ENCOUNTER — Inpatient Hospital Stay (HOSPITAL_BASED_OUTPATIENT_CLINIC_OR_DEPARTMENT_OTHER): Payer: BC Managed Care – PPO | Admitting: Oncology

## 2022-08-15 ENCOUNTER — Encounter: Payer: Self-pay | Admitting: Radiation Oncology

## 2022-08-15 VITALS — BP 134/79 | HR 72 | Temp 96.9°F | Resp 18

## 2022-08-15 VITALS — Wt 152.6 lb

## 2022-08-15 DIAGNOSIS — C029 Malignant neoplasm of tongue, unspecified: Secondary | ICD-10-CM | POA: Diagnosis not present

## 2022-08-15 DIAGNOSIS — C01 Malignant neoplasm of base of tongue: Secondary | ICD-10-CM | POA: Diagnosis present

## 2022-08-15 DIAGNOSIS — Z510 Encounter for antineoplastic radiation therapy: Secondary | ICD-10-CM | POA: Insufficient documentation

## 2022-08-15 DIAGNOSIS — Z87891 Personal history of nicotine dependence: Secondary | ICD-10-CM | POA: Insufficient documentation

## 2022-08-15 MED ORDER — HYDROCODONE-ACETAMINOPHEN 5-325 MG PO TABS: 1.0000 | ORAL_TABLET | Freq: Four times a day (QID) | ORAL | 0 refills | Status: DC | PRN

## 2022-08-15 NOTE — Consult Note (Signed)
NEW PATIENT EVALUATION  Name: Holly Vega  MRN: [id number]  Date:   08/15/2022     DOB: September 28, 1975   This 46 y.o. female patient presents to the clinic for initial evaluation of stage III (cT2 cN1 M0) p16 positive squamous cell carcinoma of the left tongue base.  REFERRING PHYSICIAN: No ref. provider found  CHIEF COMPLAINT:  Chief Complaint  Patient presents with   Cancer of the tongue    DIAGNOSIS: The encounter diagnosis was Tongue cancer ([place]).   PREVIOUS INVESTIGATIONS:  PET scan and CT scans reviewed Pathology reports reviewed Clinical notes reviewed  HPI: Patient is a 46 year old female who presents with a several month history of increasing trismus as well as loss of taste and soreness in her posterior oropharynx.  She is progressed to some swelling the left side of her neck was seen by ENT and noted to have a mass in the left tongue base.  CT scan demonstrated a 2.8 cm peripheral enhancing mass lesion left retromolar trigone concerning for primary squamous cell carcinoma.  She had some enlarged hyperdense left level 2 and 3 lymph nodes concerning for metastatic disease.  She also had some enlarged left alveolar canal with irregular margins.  PET scan showed hypermetabolic left base of tongue mass compatible with primary neoplasm.  There was some minimal metabolic 5 mm left level 2 lymph nodes nonspecific.  There is no evidence of distant disease.  Biopsy was positive for invasive p16 positive squamous cell carcinoma.  She has been seen by medical oncology with recommendation for concurrent chemoradiation.  She is seen today for radiation oncology opinion.  Again continues to have significant trismus some oral pain for which she has requested narcotic analgesics for medical oncology.  PLANNED TREATMENT REGIMEN: Concurrent chemoradiation  PAST MEDICAL HISTORY:  has a past medical history of Depression, Difficult intubation, and Endometriosis.    PAST SURGICAL HISTORY:  Past  Surgical History:  Procedure Laterality Date   ABDOMINAL HYSTERECTOMY  11/20/2018   [facility]   APPENDECTOMY  11/20/2018   [facility]   DIRECT LARYNGOSCOPY N/A 07/21/2022   Procedure: DIRECT MICROLARYNGOSCOPY;  Surgeon: Margaretha Sheffield, MD;  Location: [place];  Service: ENT;  Laterality: N/A;   LAPAROSCOPY  01/25/2018   Procedure: LAPAROSCOPY DIAGNOSTIC;  Surgeon: Will Bonnet, MD;  Location: [facility];  Service: Gynecology;;   TONGUE BIOPSY N/A 07/21/2022   Procedure: TONGUE BASE BIOPSY;  Surgeon: Margaretha Sheffield, MD;  Location: [place];  Service: ENT;  Laterality: N/A;    FAMILY HISTORY: family history includes Cancer in her maternal uncle.  SOCIAL HISTORY:  reports that she quit smoking about 5 weeks ago. Her smoking use included cigarettes. She has a 22.50 pack-year smoking history. She has never used smokeless tobacco. She reports that she does not drink alcohol and does not use drugs.  ALLERGIES: Chlorhexidine gluconate  MEDICATIONS:  Current Outpatient Medications  Medication Sig Dispense Refill   naproxen (NAPROSYN) 500 MG tablet Take 1 tablet (500 mg total) by mouth 2 (two) times daily with a meal. (Patient not taking: Reported on 08/15/2022) 10 tablet 2   No current facility-administered medications for this encounter.    ECOG PERFORMANCE STATUS:  1 - Symptomatic but completely ambulatory  REVIEW OF SYSTEMS: Patient denies any weight loss, fatigue, weakness, fever, chills or night sweats. Patient denies any loss of vision, blurred vision. Patient denies any ringing  of the ears or hearing loss. No irregular heartbeat. Patient denies heart murmur or history of fainting.  Patient denies any chest pain or pain radiating to her upper extremities. Patient denies any shortness of breath, difficulty breathing at night, cough or hemoptysis. Patient denies any swelling in the lower legs. Patient denies any nausea vomiting, vomiting of blood, or coffee ground material in the  vomitus. Patient denies any stomach pain. Patient states has had normal bowel movements no significant constipation or diarrhea. Patient denies any dysuria, hematuria or significant nocturia. Patient denies any problems walking, swelling in the joints or loss of balance. Patient denies any skin changes, loss of hair or loss of weight. Patient denies any excessive worrying or anxiety or significant depression. Patient denies any problems with insomnia. Patient denies excessive thirst, polyuria, polydipsia. Patient denies any swollen glands, patient denies easy bruising or easy bleeding. Patient denies any recent infections, allergies or URI. Patient "s visual fields have not changed significantly in recent time.   PHYSICAL EXAM: Wt 152 lb 9.6 oz (69.2 kg)   LMP 01/28/2018 (Exact Date)   BMI 23.41 kg/m  Neck is clear without evidence of cervical or supraclavicular adenopathy.  Oral exam is difficult secondary to trismus.  Well-developed well-nourished patient in NAD. HEENT reveals PERLA, EOMI, discs not visualized.  Oral cavity is clear. No oral mucosal lesions are identified. Neck is clear without evidence of cervical or supraclavicular adenopathy. Lungs are clear to A&P. Cardiac examination is essentially unremarkable with regular rate and rhythm without murmur rub or thrill. Abdomen is benign with no organomegaly or masses noted. Motor sensory and DTR levels are equal and symmetric in the upper and lower extremities. Cranial nerves II through XII are grossly intact. Proprioception is intact. No peripheral adenopathy or edema is identified. No motor or sensory levels are noted. Crude visual fields are within normal range.  LABORATORY DATA: Pathology reports reviewed compatible with above-stated findings    RADIOLOGY RESULTS: CT scan and PET CT scan reviewed compatible with above-stated findings   IMPRESSION: Stage III p16 positive squamous cell carcinoma of the left tongue base in 46 year old  female  PLAN: At this time I have recommended concurrent chemoradiation therapy.  I would plan on delivering 70 Gray to the area of hypermetabolic activity of the primary lung base.  Would also treat lymph nodes in her neck to 54 Gray.  Based on the size of the tumor would favor treating bilateral cervical neck nodes.  Area of minimal hypermetabolic activity in the left cervical nodes will receive 60 Gray.  Risks and benefits of treatment occluding possible dysphagia oral mucositis skin reaction loss of taste slight xerostomia all were reviewed in detail with the patient and her mother.  I have personally set up and ordered CT simulation for next week.  I will use PET fusion studies for treatment planning.  Patient comprehends my recommendations well.  We will coordinate chemotherapy with medical oncology.  There will be extra effort by both professional staff as well as technical staff to coordinate and manage concurrent chemoradiation and ensuing side effects during her treatments.  I would like to take this opportunity to thank you for allowing me to participate in the care of your patient.Noreene Filbert, MD

## 2022-08-15 NOTE — Progress Notes (Signed)
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  INTERVAL HISTORY: Patient returns to clinic today for further evaluation, discussion of her PET scan results, and treatment planning.  She continues to have left neck pain, but otherwise feels well. She has no neurologic complaints.  She denies any recent fevers or illnesses.  She has good appetite and denies weight loss.  She has no chest pain, shortness of breath, cough, or hemoptysis.  She denies any nausea, vomiting, constipation, or diarrhea.  She has no urinary complaints.  Patient offers no further specific complaints today.  REVIEW OF SYSTEMS:   Review of Systems  Constitutional: Negative.  Negative for fever, malaise/fatigue and weight loss.  HENT:  Negative for sore throat.   Respiratory: Negative.  Negative for cough, hemoptysis and shortness of breath.   Cardiovascular: Negative.  Negative for chest pain and leg swelling.  Gastrointestinal: Negative.  Negative for abdominal pain.  Genitourinary: Negative.  Negative for dysuria.  Musculoskeletal:  Positive for neck pain.  Skin: Negative.  Negative for rash.  Neurological: Negative.  Negative for dizziness, focal weakness, weakness and headaches.  Psychiatric/Behavioral: Negative.  The patient is not nervous/anxious.     As per HPI. Otherwise, a complete review of systems is negative.  PAST MEDICAL HISTORY: Past Medical History:  Diagnosis Date   Depression    Difficult intubation    Endometriosis     PAST SURGICAL HISTORY: Past Surgical History:  Procedure Laterality Date   ABDOMINAL HYSTERECTOMY  11/20/2018   [facility]   APPENDECTOMY  11/20/2018   [facility]   DIRECT LARYNGOSCOPY N/A 07/21/2022   Procedure: DIRECT  MICROLARYNGOSCOPY;  Surgeon: Margaretha Sheffield, MD;  Location: [place];  Service: ENT;  Laterality: N/A;   LAPAROSCOPY  01/25/2018   Procedure: LAPAROSCOPY DIAGNOSTIC;  Surgeon: Will Bonnet, MD;  Location: [facility];  Service: Gynecology;;   TONGUE BIOPSY N/A 07/21/2022   Procedure: TONGUE BASE BIOPSY;  Surgeon: Margaretha Sheffield, MD;  Location: [place];  Service: ENT;  Laterality: N/A;    FAMILY HISTORY: Family History  Problem Relation Age of Onset   Cancer Maternal Uncle     ADVANCED DIRECTIVES (Y/N):  N  HEALTH MAINTENANCE: Social History   Tobacco Use   Smoking status: Former    Packs/day: 0.75    Years: 30.00    Total pack years: 22.50    Types: Cigarettes    Quit date: 07/05/2022    Years since quitting: 0.1   Smokeless tobacco: Never   Tobacco comments:       Vaping Use   Vaping Use: Never used  Substance Use Topics   Alcohol use: No   Drug use: No     Colonoscopy:  PAP:  Bone density:  Lipid panel:  Allergies  Allergen Reactions   Chlorhexidine Gluconate Rash    Current Outpatient Medications  Medication Sig Dispense Refill   HYDROcodone-acetaminophen (NORCO) 5-325 MG tablet Take 1 tablet by mouth every 6 (six) hours as needed for moderate pain. 60 tablet 0   naproxen (NAPROSYN) 500 MG tablet Take 1 tablet (500 mg total) by mouth 2 (two) times daily with a meal. (Patient not taking: Reported on 08/15/2022) 10 tablet 2   No current facility-administered medications for this visit.  OBJECTIVE: Vitals:   08/15/22 0940  BP: 134/79  Pulse: 72  Resp: 18  Temp: (!) 96.9 F (36.1 C)  SpO2: 100%     There is no height or weight on file to calculate BMI.    ECOG FS:0 - Asymptomatic  General: Well-developed, well-nourished, no acute distress. Eyes: Pink conjunctiva, anicteric sclera. HEENT: Normocephalic, moist mucous membranes.  No palpable lymphadenopathy. Lungs: No audible wheezing or coughing. Heart: Regular rate and  rhythm. Abdomen: Soft, nontender, no obvious distention. Musculoskeletal: No edema, cyanosis, or clubbing. Neuro: Alert, answering all questions appropriately. Cranial nerves grossly intact. Skin: No rashes or petechiae noted. Psych: Normal affect.  LAB RESULTS:  Lab Results  Component Value Date   NA 139 07/04/2022   K 3.7 07/04/2022   CL 105 07/04/2022   CO2 27 07/04/2022   GLUCOSE 98 07/04/2022   BUN 6 07/04/2022   CREATININE 0.60 07/04/2022   CALCIUM 9.9 07/04/2022   PROT 8.2 (H) 07/04/2022   ALBUMIN 4.9 07/04/2022   AST 18 07/04/2022   ALT 14 07/04/2022   ALKPHOS 82 07/04/2022   BILITOT 0.6 07/04/2022   GFRNONAA >60 07/04/2022   GFRAA >60 01/15/2018    Lab Results  Component Value Date   WBC 10.0 07/04/2022   NEUTROABS 6.0 07/04/2022   HGB 15.4 (H) 07/04/2022   HCT 46.2 (H) 07/04/2022   MCV 90.8 07/04/2022   PLT 292 07/04/2022     STUDIES: NM PET Image Initial (PI) Skull Base To Thigh (F-18 FDG)  Result Date: 08/10/2022 CLINICAL DATA:  Initial treatment strategy for base of tongue mass. EXAM: NUCLEAR MEDICINE PET SKULL BASE TO THIGH TECHNIQUE: 8.56 mCi F-18 FDG was injected intravenously. Full-ring PET imaging was performed from the skull base to thigh after the radiotracer. CT data was obtained and used for attenuation correction and anatomic localization. Fasting blood glucose: 101 mg/dl COMPARISON:  Neck CT July 04, 2022 and CT abdomen pelvis Feb 12, 2017 FINDINGS: Mediastinal blood pool activity: SUV max 1.5 Liver activity: SUV max NA NECK: Hypermetabolic left base of tongue mass is difficult to measure on this noncontrast enhanced CT measuring a proximally 2.3 x 2.1 cm on image 38/2 with a max SUV of 9.8. Minimally metabolic left level 2 lymph node measures 5 mm in short axis on image 45/2 with a max SUV of 1.7. No hypermetabolic right-sided or low cervical lymph nodes identified. Incidental CT findings: None. CHEST: No hypermetabolic pulmonary nodules or  masses. No hypermetabolic thoracic adenopathy. Incidental CT findings: Hypoventilatory change in the lung bases. Motion degraded examination reveals no suspicious pulmonary nodules or masses. ABDOMEN/PELVIS: No abnormal hypermetabolic activity within the liver, pancreas, adrenal glands, or spleen. No hypermetabolic lymph nodes in the abdomen or pelvis. Incidental CT findings: None. SKELETON: No focal hypermetabolic activity to suggest skeletal metastasis. Incidental CT findings: Multilevel degenerative changes spine. Lesion in the proximal left femur with internal chondroid matrix on image 232/3 without significant abnormal FDG avidity partially visualized on prior CT Feb 12, 2017 is compatible with a benign osteochondral lesion. IMPRESSION: 1. Hypermetabolic left base of tongue mass, compatible with primary neoplasm. 2. Minimally metabolic 5 mm left level 2 lymph node is nonspecific and may be reactive or reflect early nodal disease involvement. 3. No evidence of hypermetabolic distant metastatic disease in the chest, abdomen or pelvis. Electronically Signed   By: Dahlia Bailiff M.D.   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  PET scan results from August 10, 2022 reviewed independently and report as above with hypermetabolic left base of tongue mass and minimally metabolic lymph node unclear if this is a metastasis or reactive.  Patient will benefit from concurrent chemotherapy using cisplatin 40 mg/m2 once a week along with daily XRT.  We discussed possible port placement, but this is not necessary at this time.  Patient has an appointment with radiation oncology later this morning.  No further intervention is needed.  Return to clinic on August 30, 2022 for  further evaluation and consideration of cycle 1.  Pain: Patient was given a prescription for Norco today.  I spent a total of 30 minutes reviewing chart data, face-to-face evaluation with the patient, counseling and coordination of care as detailed above.    Patient expressed understanding and was in agreement with this plan. She also understands that She can call clinic at any time with any questions, concerns, or complaints.    Cancer Staging  Tongue cancer [facility]) Staging form: Oral Cavity, AJCC 8th Edition - Clinical stage from 08/05/2022: Stage III (cT3, cN1, cM0) - Signed by Lloyd Huger, MD on 08/05/2022 Stage prefix: Initial diagnosis   Lloyd Huger, MD   08/15/2022 1:52 PM

## 2022-08-15 NOTE — Progress Notes (Signed)
START ON PATHWAY REGIMEN - Head and Neck     A cycle is every 7 days:     Cisplatin   **Always confirm dose/schedule in your pharmacy ordering system**  Patient Characteristics: Oropharynx, HPV Positive, Preoperative or Nonsurgical Candidate (Clinical Staging), cT0-4, cN1-3 or cT3-4, cN0 Disease Classification: Oropharynx HPV Status: Positive (+) Therapeutic Status: Preoperative or Nonsurgical Candidate (Clinical Staging) AJCC T Category: cT3 AJCC 8 Stage Grouping: II AJCC N Category: cN1 AJCC M Category: cM0 Intent of Therapy: Curative Intent, Discussed with Patient 

## 2022-08-15 NOTE — Progress Notes (Signed)
Pt had a PET scan. Here for results and treatment discussions. Pt having Hot flashes a lot. Aching pain in L ear and jaw.

## 2022-08-16 ENCOUNTER — Ambulatory Visit: Payer: BC Managed Care – PPO

## 2022-08-16 ENCOUNTER — Other Ambulatory Visit: Payer: Self-pay

## 2022-08-17 ENCOUNTER — Other Ambulatory Visit: Payer: Self-pay

## 2022-08-18 ENCOUNTER — Other Ambulatory Visit: Payer: Self-pay

## 2022-08-22 ENCOUNTER — Inpatient Hospital Stay: Payer: BC Managed Care – PPO

## 2022-08-23 ENCOUNTER — Ambulatory Visit
Admission: RE | Admit: 2022-08-23 | Discharge: 2022-08-23 | Disposition: A | Discharge status: Home / Self Care | Payer: BC Managed Care – PPO | Source: Ambulatory Visit | Attending: Radiation Oncology | Admitting: Radiation Oncology

## 2022-08-23 DIAGNOSIS — Z510 Encounter for antineoplastic radiation therapy: Secondary | ICD-10-CM | POA: Diagnosis not present

## 2022-08-29 MED FILL — Fosaprepitant Dimeglumine For IV Infusion 150 MG (Base Eq): INTRAVENOUS | Qty: 5 | Status: AC

## 2022-08-29 MED FILL — Dexamethasone Sodium Phosphate Inj 100 MG/10ML: INTRAMUSCULAR | Qty: 1 | Status: AC

## 2022-08-30 ENCOUNTER — Inpatient Hospital Stay: Payer: BC Managed Care – PPO

## 2022-08-30 ENCOUNTER — Encounter: Payer: Self-pay | Admitting: Oncology

## 2022-08-30 ENCOUNTER — Inpatient Hospital Stay (HOSPITAL_BASED_OUTPATIENT_CLINIC_OR_DEPARTMENT_OTHER): Payer: BC Managed Care – PPO | Admitting: Oncology

## 2022-08-30 VITALS — BP 124/83 | HR 71 | Temp 98.0°F | Resp 16 | Ht 67.7 in | Wt 153.0 lb

## 2022-08-30 DIAGNOSIS — Z87891 Personal history of nicotine dependence: Secondary | ICD-10-CM | POA: Insufficient documentation

## 2022-08-30 DIAGNOSIS — Z79899 Other long term (current) drug therapy: Secondary | ICD-10-CM | POA: Insufficient documentation

## 2022-08-30 DIAGNOSIS — Z5111 Encounter for antineoplastic chemotherapy: Secondary | ICD-10-CM | POA: Insufficient documentation

## 2022-08-30 DIAGNOSIS — C01 Malignant neoplasm of base of tongue: Secondary | ICD-10-CM | POA: Insufficient documentation

## 2022-08-30 DIAGNOSIS — C029 Malignant neoplasm of tongue, unspecified: Secondary | ICD-10-CM

## 2022-08-30 DIAGNOSIS — Z510 Encounter for antineoplastic radiation therapy: Secondary | ICD-10-CM | POA: Diagnosis not present

## 2022-08-30 LAB — BASIC METABOLIC PANEL
Anion gap: 11 (ref 5–15)
BUN: 7 mg/dL (ref 6–20)
CO2: 25 mmol/L (ref 22–32)
Calcium: 9.7 mg/dL (ref 8.9–10.3)
Chloride: 105 mmol/L (ref 98–111)
Creatinine, Ser: 0.57 mg/dL (ref 0.44–1.00)
GFR, Estimated: >60 mL/min (ref >60)
Glucose, Bld: 100 mg/dL — ABNORMAL HIGH (ref 70–99)
Potassium: 3.6 mmol/L (ref 3.5–5.1)
Sodium: 141 mmol/L (ref 135–145)

## 2022-08-30 LAB — CBC WITH DIFFERENTIAL/PLATELET
Abs Immature Granulocytes: 0.01 10*3/uL (ref 0.00–0.07)
Basophils Absolute: 0 10*3/uL (ref 0.0–0.1)
Basophils Relative: 1 %
Eosinophils Absolute: 0.1 10*3/uL (ref 0.0–0.5)
Eosinophils Relative: 2 %
HCT: 45.9 % (ref 36.0–46.0)
Hemoglobin: 15.6 g/dL — ABNORMAL HIGH (ref 12.0–15.0)
Immature Granulocytes: 0 %
Lymphocytes Relative: 43 %
Lymphs Abs: 2.4 10*3/uL (ref 0.7–4.0)
MCH: 30.6 pg (ref 26.0–34.0)
MCHC: 34 g/dL (ref 30.0–36.0)
MCV: 90.2 fL (ref 80.0–100.0)
Monocytes Absolute: 0.4 10*3/uL (ref 0.1–1.0)
Monocytes Relative: 7 %
Neutro Abs: 2.6 10*3/uL (ref 1.7–7.7)
Neutrophils Relative %: 47 %
Platelets: 264 10*3/uL (ref 150–400)
RBC: 5.09 MIL/uL (ref 3.87–5.11)
RDW: 13.9 % (ref 11.5–15.5)
WBC: 5.6 10*3/uL (ref 4.0–10.5)
nRBC: 0 % (ref 0.0–0.2)

## 2022-08-30 LAB — MAGNESIUM: Magnesium: 2.2 mg/dL (ref 1.7–2.4)

## 2022-08-30 MED ORDER — PROCHLORPERAZINE MALEATE 10 MG PO TABS: 10.0000 mg | ORAL_TABLET | Freq: Four times a day (QID) | ORAL | 1 refills | Status: DC | PRN

## 2022-08-30 MED ORDER — SODIUM CHLORIDE 0.9 % IV SOLN
10.0000 mg | Freq: Once | INTRAVENOUS | Status: AC
  Administered 2022-08-30: 10 mg via INTRAVENOUS
  Filled 2022-08-30: qty 1

## 2022-08-30 MED ORDER — ONDANSETRON HCL 8 MG PO TABS: 8.0000 mg | ORAL_TABLET | Freq: Three times a day (TID) | ORAL | 1 refills | Status: DC | PRN

## 2022-08-30 MED ORDER — PALONOSETRON HCL INJECTION 0.25 MG/5ML
0.2500 mg | Freq: Once | INTRAVENOUS | Status: AC
  Administered 2022-08-30: 0.25 mg via INTRAVENOUS
  Filled 2022-08-30: qty 5

## 2022-08-30 MED ORDER — POTASSIUM CHLORIDE IN NACL 20-0.9 MEQ/L-% IV SOLN
Freq: Once | INTRAVENOUS | Status: AC
  Filled 2022-08-30: qty 1000

## 2022-08-30 MED ORDER — SODIUM CHLORIDE 0.9 % IV SOLN
Freq: Once | INTRAVENOUS | Status: AC
  Filled 2022-08-30: qty 250

## 2022-08-30 MED ORDER — SODIUM CHLORIDE 0.9 % IV SOLN
150.0000 mg | Freq: Once | INTRAVENOUS | Status: AC
  Administered 2022-08-30: 150 mg via INTRAVENOUS
  Filled 2022-08-30: qty 5

## 2022-08-30 MED ORDER — SODIUM CHLORIDE 0.9 % IV SOLN
40.0000 mg/m2 | Freq: Once | INTRAVENOUS | Status: AC
  Administered 2022-08-30: 73 mg via INTRAVENOUS
  Filled 2022-08-30: qty 73

## 2022-08-30 MED ORDER — MAGNESIUM SULFATE 2 GM/50ML IV SOLN
2.0000 g | Freq: Once | INTRAVENOUS | Status: AC
  Administered 2022-08-30: 2 g via INTRAVENOUS
  Filled 2022-08-30: qty 50

## 2022-08-30 NOTE — Patient Instructions (Signed)
[facility]  Discharge Instructions: Thank you for choosing [facility] to provide your oncology and hematology care.  If you have a lab appointment with the Cancer Center, please go directly to the Cancer Center and check in at the registration area.  Wear comfortable clothing and clothing appropriate for easy access to any Portacath or PICC line.   We strive to give you quality time with your provider. You may need to reschedule your appointment if you arrive late (15 or more minutes).  Arriving late affects you and other patients whose appointments are after yours.  Also, if you miss three or more appointments without notifying the office, you may be dismissed from the clinic at the provider's discretion.      For prescription refill requests, have your pharmacy contact our office and allow 72 hours for refills to be completed.    Today you received the following chemotherapy and/or immunotherapy agents Cisplatin    To help prevent nausea and vomiting after your treatment, we encourage you to take your nausea medication as directed.  BELOW ARE SYMPTOMS THAT SHOULD BE REPORTED IMMEDIATELY: *FEVER GREATER THAN 100.4 F (38 C) OR HIGHER *CHILLS OR SWEATING *NAUSEA AND VOMITING THAT IS NOT CONTROLLED WITH YOUR NAUSEA MEDICATION *UNUSUAL SHORTNESS OF BREATH *UNUSUAL BRUISING OR BLEEDING *URINARY PROBLEMS (pain or burning when urinating, or frequent urination) *BOWEL PROBLEMS (unusual diarrhea, constipation, pain near the anus) TENDERNESS IN MOUTH AND THROAT WITH OR WITHOUT PRESENCE OF ULCERS (sore throat, sores in mouth, or a toothache) UNUSUAL RASH, SWELLING OR PAIN  UNUSUAL VAGINAL DISCHARGE OR ITCHING   Items with * indicate a potential emergency and should be followed up as soon as possible or go to the Emergency Department if any problems should occur.  Please show the CHEMOTHERAPY ALERT CARD or IMMUNOTHERAPY ALERT CARD at check-in to the  Emergency Department and triage nurse.  Should you have questions after your visit or need to cancel or reschedule your appointment, please contact [facility]  [phone] and follow the prompts.  Office hours are 8:00 a.m. to 4:30 p.m. Monday - Friday. Please note that voicemails left after 4:00 p.m. may not be returned until the following business day.  We are closed weekends and major holidays. You have access to a nurse at all times for urgent questions. Please call the main number to the clinic [phone] and follow the prompts.  For any non-urgent questions, you may also contact your provider using MyChart. We now offer e-Visits for anyone 18 and older to request care online for non-urgent symptoms. For details visit [email or web address].   Also download the MyChart app! Go to the app store, search "MyChart", open the app, select [facility], and log in with your MyChart username and password.  Masks are optional in the cancer centers. If you would like for your care team to wear a mask while they are taking care of you, please let them know. For doctor visits, patients may have with them one support person who is at least 48 years old. At this time, visitors are not allowed in the infusion area.   

## 2022-08-30 NOTE — Progress Notes (Signed)
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  INTERVAL HISTORY: Patient returns to clinic today for further evaluation and initiation of cycle 1 of weekly cisplatin.  She will initiate XRT later this week.  She is anxious, but otherwise feels well.  She does not complain of neck pain today.  She has no neurologic complaints.  She denies any recent fevers or illnesses.  She has good appetite and denies weight loss.  She has no chest pain, shortness of breath, cough, or hemoptysis.  She denies any nausea, vomiting, constipation, or diarrhea.  She has no urinary complaints.  Patient offers no further specific complaints today.  REVIEW OF SYSTEMS:   Review of Systems  Constitutional: Negative.  Negative for fever, malaise/fatigue and weight loss.  HENT:  Negative for sore throat.   Respiratory: Negative.  Negative for cough, hemoptysis and shortness of breath.   Cardiovascular: Negative.  Negative for chest pain and leg swelling.  Gastrointestinal: Negative.  Negative for abdominal pain.  Genitourinary: Negative.  Negative for dysuria.  Musculoskeletal:  Positive for neck pain.  Skin: Negative.  Negative for rash.  Neurological: Negative.  Negative for dizziness, focal weakness, weakness and headaches.  Psychiatric/Behavioral:  The patient is nervous/anxious.     As per HPI. Otherwise, a complete review of systems is negative.  PAST MEDICAL HISTORY: Past Medical History:  Diagnosis Date   Depression    Difficult intubation    Endometriosis     PAST SURGICAL HISTORY: Past Surgical History:  Procedure Laterality Date   ABDOMINAL HYSTERECTOMY  11/20/2018   [facility]   APPENDECTOMY  11/20/2018   [facility]   DIRECT LARYNGOSCOPY N/A  07/21/2022   Procedure: DIRECT MICROLARYNGOSCOPY;  Surgeon: Margaretha Sheffield, MD;  Location: [facility];  Service: ENT;  Laterality: N/A;   LAPAROSCOPY  01/25/2018   Procedure: LAPAROSCOPY DIAGNOSTIC;  Surgeon: Will Bonnet, MD;  Location: [facility];  Service: Gynecology;;   TONGUE BIOPSY N/A 07/21/2022   Procedure: TONGUE BASE BIOPSY;  Surgeon: Margaretha Sheffield, MD;  Location: [place];  Service: ENT;  Laterality: N/A;    FAMILY HISTORY: Family History  Problem Relation Age of Onset   Cancer Maternal Uncle     ADVANCED DIRECTIVES (Y/N):  N  HEALTH MAINTENANCE: Social History   Tobacco Use   Smoking status: Former    Packs/day: 0.75    Years: 30.00    Total pack years: 22.50    Types: Cigarettes    Quit date: 07/05/2022    Years since quitting: 0.1   Smokeless tobacco: Never   Tobacco comments:       Vaping Use   Vaping Use: Never used  Substance Use Topics   Alcohol use: No   Drug use: No     Colonoscopy:  PAP:  Bone density:  Lipid panel:  Allergies  Allergen Reactions   Chlorhexidine Gluconate Rash    Current Outpatient Medications  Medication Sig Dispense Refill   HYDROcodone-acetaminophen (NORCO) 5-325 MG tablet Take 1 tablet by mouth every 6 (six) hours as needed for moderate pain. 60 tablet 0   naproxen (NAPROSYN) 500 MG tablet Take 1 tablet (500 mg total) by mouth 2 (two) times daily with a meal. (Patient not taking: Reported on 08/15/2022) 10 tablet  2   ondansetron (ZOFRAN) 8 MG tablet Take 1 tablet (8 mg total) by mouth every 8 (eight) hours as needed for nausea or vomiting. Start on the third day after cisplatin. 60 tablet 1   prochlorperazine (COMPAZINE) 10 MG tablet Take 1 tablet (10 mg total) by mouth every 6 (six) hours as needed (Nausea or vomiting). 60 tablet 1   No current facility-administered medications for this visit.    OBJECTIVE: Vitals:   08/30/22 0829  BP: 124/83  Pulse: 71  Resp: 16  Temp: 98 F (36.7 C)   SpO2: 100%     Body mass index is 23.47 kg/m.    ECOG FS:0 - Asymptomatic  General: Well-developed, well-nourished, no acute distress. Eyes: Pink conjunctiva, anicteric sclera. HEENT: Normocephalic, moist mucous membranes.  No palpable lymphadenopathy. Lungs: No audible wheezing or coughing. Heart: Regular rate and rhythm. Abdomen: Soft, nontender, no obvious distention. Musculoskeletal: No edema, cyanosis, or clubbing. Neuro: Alert, answering all questions appropriately. Cranial nerves grossly intact. Skin: No rashes or petechiae noted. Psych: Normal affect.   LAB RESULTS:  Lab Results  Component Value Date   NA 141 08/30/2022   K 3.6 08/30/2022   CL 105 08/30/2022   CO2 25 08/30/2022   GLUCOSE 100 (H) 08/30/2022   BUN 7 08/30/2022   CREATININE 0.57 08/30/2022   CALCIUM 9.7 08/30/2022   PROT 8.2 (H) 07/04/2022   ALBUMIN 4.9 07/04/2022   AST 18 07/04/2022   ALT 14 07/04/2022   ALKPHOS 82 07/04/2022   BILITOT 0.6 07/04/2022   GFRNONAA >60 08/30/2022   GFRAA >60 01/15/2018    Lab Results  Component Value Date   WBC 5.6 08/30/2022   NEUTROABS 2.6 08/30/2022   HGB 15.6 (H) 08/30/2022   HCT 45.9 08/30/2022   MCV 90.2 08/30/2022   PLT 264 08/30/2022     STUDIES: NM PET Image Initial (PI) Skull Base To Thigh (F-18 FDG)  Result Date: 08/10/2022 CLINICAL DATA:  Initial treatment strategy for base of tongue mass. EXAM: NUCLEAR MEDICINE PET SKULL BASE TO THIGH TECHNIQUE: 8.56 mCi F-18 FDG was injected intravenously. Full-ring PET imaging was performed from the skull base to thigh after the radiotracer. CT data was obtained and used for attenuation correction and anatomic localization. Fasting blood glucose: 101 mg/dl COMPARISON:  Neck CT July 04, 2022 and CT abdomen pelvis Feb 12, 2017 FINDINGS: Mediastinal blood pool activity: SUV max 1.5 Liver activity: SUV max NA NECK: Hypermetabolic left base of tongue mass is difficult to measure on this noncontrast enhanced CT  measuring a proximally 2.3 x 2.1 cm on image 38/2 with a max SUV of 9.8. Minimally metabolic left level 2 lymph node measures 5 mm in short axis on image 45/2 with a max SUV of 1.7. No hypermetabolic right-sided or low cervical lymph nodes identified. Incidental CT findings: None. CHEST: No hypermetabolic pulmonary nodules or masses. No hypermetabolic thoracic adenopathy. Incidental CT findings: Hypoventilatory change in the lung bases. Motion degraded examination reveals no suspicious pulmonary nodules or masses. ABDOMEN/PELVIS: No abnormal hypermetabolic activity within the liver, pancreas, adrenal glands, or spleen. No hypermetabolic lymph nodes in the abdomen or pelvis. Incidental CT findings: None. SKELETON: No focal hypermetabolic activity to suggest skeletal metastasis. Incidental CT findings: Multilevel degenerative changes spine. Lesion in the proximal left femur with internal chondroid matrix on image 232/3 without significant abnormal FDG avidity partially visualized on prior CT Feb 12, 2017 is compatible with a benign osteochondral lesion. IMPRESSION: 1. Hypermetabolic left base of tongue mass, compatible  with primary neoplasm. 2. Minimally metabolic 5 mm left level 2 lymph node is nonspecific and may be reactive or reflect early nodal disease involvement. 3. No evidence of hypermetabolic distant metastatic disease in the chest, abdomen or pelvis. Electronically Signed   By: Dahlia Bailiff M.D.   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  PET scan results from August 10, 2022 reviewed independently with hypermetabolic left base of tongue mass and minimally metabolic lymph node unclear if this is a metastasis or reactive.  Patient will benefit from  concurrent chemotherapy using cisplatin 40 mg/m2 once a week along with daily XRT.  We discussed possible port placement, but this is not necessary at this time.  Proceed with cycle 1 of weekly cisplatin today.  Patient will initiate XRT later this week.  Return to clinic in 1 week for further evaluation and consideration of cycle 2.   Pain: Continue Norco as prescribed.  I spent a total of 30 minutes reviewing chart data, face-to-face evaluation with the patient, counseling and coordination of care as detailed above.   Patient expressed understanding and was in agreement with this plan. She also understands that She can call clinic at any time with any questions, concerns, or complaints.    Cancer Staging  Tongue cancer [facility]) Staging form: Oral Cavity, AJCC 8th Edition - Clinical stage from 08/05/2022: Stage III (cT3, cN1, cM0) - Signed by Lloyd Huger, MD on 08/05/2022 Stage prefix: Initial diagnosis   Lloyd Huger, MD   08/30/2022 10:02 AM

## 2022-08-30 NOTE — Progress Notes (Signed)
Starting Cisplatin. Okay to Run post fluids during treatment per MD.

## 2022-08-31 ENCOUNTER — Telehealth: Payer: Self-pay

## 2022-08-31 NOTE — Telephone Encounter (Signed)
Telephone call to patient for follow up after receiving first infusion.   Patient states infusion went great.  States eating good and drinking plenty of fluids.   Denies any nausea or vomiting.  Encouraged patient to call for any concerns or questions. 

## 2022-09-01 ENCOUNTER — Ambulatory Visit
Admission: RE | Admit: 2022-09-01 | Discharge: 2022-09-01 | Disposition: A | Discharge status: Home / Self Care | Payer: BC Managed Care – PPO | Source: Ambulatory Visit | Attending: Radiation Oncology | Admitting: Radiation Oncology

## 2022-09-05 ENCOUNTER — Other Ambulatory Visit: Payer: Self-pay

## 2022-09-05 ENCOUNTER — Ambulatory Visit
Admission: RE | Admit: 2022-09-05 | Discharge: 2022-09-05 | Disposition: A | Discharge status: Home / Self Care | Payer: BC Managed Care – PPO | Source: Ambulatory Visit | Attending: Radiation Oncology | Admitting: Radiation Oncology

## 2022-09-05 DIAGNOSIS — Z510 Encounter for antineoplastic radiation therapy: Secondary | ICD-10-CM | POA: Diagnosis not present

## 2022-09-05 LAB — RAD ONC ARIA SESSION SUMMARY
Course Elapsed Days: 0
Plan Fractions Treated to Date: 1
Plan Prescribed Dose Per Fraction: 2 Gy
Plan Total Fractions Prescribed: 35
Plan Total Prescribed Dose: 70 Gy
Reference Point Dosage Given to Date: 2 Gy
Reference Point Session Dosage Given: 2 Gy
Session Number: 1

## 2022-09-06 ENCOUNTER — Ambulatory Visit
Admission: RE | Admit: 2022-09-06 | Discharge: 2022-09-06 | Disposition: A | Discharge status: Home / Self Care | Payer: BC Managed Care – PPO | Source: Ambulatory Visit | Attending: Radiation Oncology | Admitting: Radiation Oncology

## 2022-09-06 ENCOUNTER — Ambulatory Visit: Payer: BC Managed Care – PPO

## 2022-09-06 ENCOUNTER — Other Ambulatory Visit: Payer: BC Managed Care – PPO

## 2022-09-06 ENCOUNTER — Other Ambulatory Visit: Payer: Self-pay

## 2022-09-06 ENCOUNTER — Ambulatory Visit: Payer: BC Managed Care – PPO | Admitting: Oncology

## 2022-09-06 DIAGNOSIS — Z510 Encounter for antineoplastic radiation therapy: Secondary | ICD-10-CM | POA: Diagnosis not present

## 2022-09-06 LAB — RAD ONC ARIA SESSION SUMMARY
Course Elapsed Days: 1
Plan Fractions Treated to Date: 2
Plan Prescribed Dose Per Fraction: 2 Gy
Plan Total Fractions Prescribed: 35
Plan Total Prescribed Dose: 70 Gy
Reference Point Dosage Given to Date: 4 Gy
Reference Point Session Dosage Given: 2 Gy
Session Number: 2

## 2022-09-07 ENCOUNTER — Ambulatory Visit
Admission: RE | Admit: 2022-09-07 | Discharge: 2022-09-07 | Disposition: A | Discharge status: Home / Self Care | Payer: BC Managed Care – PPO | Source: Ambulatory Visit | Attending: Radiation Oncology | Admitting: Radiation Oncology

## 2022-09-07 ENCOUNTER — Other Ambulatory Visit: Payer: Self-pay

## 2022-09-07 DIAGNOSIS — Z510 Encounter for antineoplastic radiation therapy: Secondary | ICD-10-CM | POA: Diagnosis not present

## 2022-09-07 LAB — RAD ONC ARIA SESSION SUMMARY
Course Elapsed Days: 2
Plan Fractions Treated to Date: 3
Plan Prescribed Dose Per Fraction: 2 Gy
Plan Total Fractions Prescribed: 35
Plan Total Prescribed Dose: 70 Gy
Reference Point Dosage Given to Date: 6 Gy
Reference Point Session Dosage Given: 2 Gy
Session Number: 3

## 2022-09-08 ENCOUNTER — Ambulatory Visit
Admission: RE | Admit: 2022-09-08 | Discharge: 2022-09-08 | Disposition: A | Discharge status: Home / Self Care | Payer: BC Managed Care – PPO | Source: Ambulatory Visit | Attending: Radiation Oncology | Admitting: Radiation Oncology

## 2022-09-08 ENCOUNTER — Other Ambulatory Visit: Payer: Self-pay

## 2022-09-08 ENCOUNTER — Other Ambulatory Visit: Payer: Self-pay | Admitting: Oncology

## 2022-09-08 DIAGNOSIS — C029 Malignant neoplasm of tongue, unspecified: Secondary | ICD-10-CM

## 2022-09-08 DIAGNOSIS — Z510 Encounter for antineoplastic radiation therapy: Secondary | ICD-10-CM | POA: Diagnosis not present

## 2022-09-08 LAB — RAD ONC ARIA SESSION SUMMARY
Course Elapsed Days: 3
Plan Fractions Treated to Date: 4
Plan Prescribed Dose Per Fraction: 2 Gy
Plan Total Fractions Prescribed: 35
Plan Total Prescribed Dose: 70 Gy
Reference Point Dosage Given to Date: 8 Gy
Reference Point Session Dosage Given: 2 Gy
Session Number: 4

## 2022-09-09 ENCOUNTER — Inpatient Hospital Stay: Payer: BC Managed Care – PPO

## 2022-09-09 ENCOUNTER — Inpatient Hospital Stay (HOSPITAL_BASED_OUTPATIENT_CLINIC_OR_DEPARTMENT_OTHER): Payer: BC Managed Care – PPO | Admitting: Oncology

## 2022-09-09 ENCOUNTER — Other Ambulatory Visit: Payer: Self-pay

## 2022-09-09 ENCOUNTER — Ambulatory Visit
Admission: RE | Admit: 2022-09-09 | Discharge: 2022-09-09 | Disposition: A | Discharge status: Home / Self Care | Payer: BC Managed Care – PPO | Source: Ambulatory Visit | Attending: Radiation Oncology | Admitting: Radiation Oncology

## 2022-09-09 DIAGNOSIS — Z510 Encounter for antineoplastic radiation therapy: Secondary | ICD-10-CM | POA: Diagnosis not present

## 2022-09-09 DIAGNOSIS — C029 Malignant neoplasm of tongue, unspecified: Secondary | ICD-10-CM | POA: Diagnosis not present

## 2022-09-09 LAB — BASIC METABOLIC PANEL
Anion gap: 9 (ref 5–15)
BUN: 8 mg/dL (ref 6–20)
CO2: 27 mmol/L (ref 22–32)
Calcium: 9.6 mg/dL (ref 8.9–10.3)
Chloride: 100 mmol/L (ref 98–111)
Creatinine, Ser: 0.61 mg/dL (ref 0.44–1.00)
GFR, Estimated: >60 mL/min (ref >60)
Glucose, Bld: 92 mg/dL (ref 70–99)
Potassium: 4.3 mmol/L (ref 3.5–5.1)
Sodium: 136 mmol/L (ref 135–145)

## 2022-09-09 LAB — RAD ONC ARIA SESSION SUMMARY
Course Elapsed Days: 4
Plan Fractions Treated to Date: 5
Plan Prescribed Dose Per Fraction: 2 Gy
Plan Total Fractions Prescribed: 35
Plan Total Prescribed Dose: 70 Gy
Reference Point Dosage Given to Date: 10 Gy
Reference Point Session Dosage Given: 2 Gy
Session Number: 5

## 2022-09-09 LAB — CBC WITH DIFFERENTIAL/PLATELET
Abs Immature Granulocytes: 0.02 10*3/uL (ref 0.00–0.07)
Basophils Absolute: 0 10*3/uL (ref 0.0–0.1)
Basophils Relative: 0 %
Eosinophils Absolute: 0.1 10*3/uL (ref 0.0–0.5)
Eosinophils Relative: 1 %
HCT: 41.6 % (ref 36.0–46.0)
Hemoglobin: 14.2 g/dL (ref 12.0–15.0)
Immature Granulocytes: 0 %
Lymphocytes Relative: 24 %
Lymphs Abs: 1.6 10*3/uL (ref 0.7–4.0)
MCH: 30.7 pg (ref 26.0–34.0)
MCHC: 34.1 g/dL (ref 30.0–36.0)
MCV: 89.8 fL (ref 80.0–100.0)
Monocytes Absolute: 0.6 10*3/uL (ref 0.1–1.0)
Monocytes Relative: 8 %
Neutro Abs: 4.5 10*3/uL (ref 1.7–7.7)
Neutrophils Relative %: 67 %
Platelets: 255 10*3/uL (ref 150–400)
RBC: 4.63 MIL/uL (ref 3.87–5.11)
RDW: 13.4 % (ref 11.5–15.5)
WBC: 6.8 10*3/uL (ref 4.0–10.5)
nRBC: 0 % (ref 0.0–0.2)

## 2022-09-09 LAB — MAGNESIUM: Magnesium: 2 mg/dL (ref 1.7–2.4)

## 2022-09-09 MED ORDER — MAGNESIUM SULFATE 2 GM/50ML IV SOLN
2.0000 g | Freq: Once | INTRAVENOUS | Status: AC
  Administered 2022-09-09: 2 g via INTRAVENOUS
  Filled 2022-09-09: qty 50

## 2022-09-09 MED ORDER — SODIUM CHLORIDE 0.9 % IV SOLN
40.0000 mg/m2 | Freq: Once | INTRAVENOUS | Status: AC
  Administered 2022-09-09: 73 mg via INTRAVENOUS
  Filled 2022-09-09: qty 73

## 2022-09-09 MED ORDER — SODIUM CHLORIDE 0.9 % IV SOLN
150.0000 mg | Freq: Once | INTRAVENOUS | Status: AC
  Administered 2022-09-09: 150 mg via INTRAVENOUS
  Filled 2022-09-09: qty 150

## 2022-09-09 MED ORDER — SODIUM CHLORIDE 0.9 % IV SOLN
Freq: Once | INTRAVENOUS | Status: AC
  Filled 2022-09-09: qty 250

## 2022-09-09 MED ORDER — SODIUM CHLORIDE 0.9 % IV SOLN
10.0000 mg | Freq: Once | INTRAVENOUS | Status: AC
  Administered 2022-09-09: 10 mg via INTRAVENOUS
  Filled 2022-09-09: qty 10

## 2022-09-09 MED ORDER — PALONOSETRON HCL INJECTION 0.25 MG/5ML
0.2500 mg | Freq: Once | INTRAVENOUS | Status: AC
  Administered 2022-09-09: 0.25 mg via INTRAVENOUS
  Filled 2022-09-09: qty 5

## 2022-09-09 MED ORDER — POTASSIUM CHLORIDE IN NACL 20-0.9 MEQ/L-% IV SOLN
Freq: Once | INTRAVENOUS | Status: AC
  Filled 2022-09-09: qty 1000

## 2022-09-09 NOTE — Patient Instructions (Signed)
[facility]  Discharge Instructions: Thank you for choosing [facility] to provide your oncology and hematology care.  If you have a lab appointment with the Cancer Center, please go directly to the Cancer Center and check in at the registration area.  Wear comfortable clothing and clothing appropriate for easy access to any Portacath or PICC line.   We strive to give you quality time with your provider. You may need to reschedule your appointment if you arrive late (15 or more minutes).  Arriving late affects you and other patients whose appointments are after yours.  Also, if you miss three or more appointments without notifying the office, you may be dismissed from the clinic at the provider's discretion.      For prescription refill requests, have your pharmacy contact our office and allow 72 hours for refills to be completed.    Today you received the following chemotherapy and/or immunotherapy agents Cisplatin    To help prevent nausea and vomiting after your treatment, we encourage you to take your nausea medication as directed.  BELOW ARE SYMPTOMS THAT SHOULD BE REPORTED IMMEDIATELY: *FEVER GREATER THAN 100.4 F (38 C) OR HIGHER *CHILLS OR SWEATING *NAUSEA AND VOMITING THAT IS NOT CONTROLLED WITH YOUR NAUSEA MEDICATION *UNUSUAL SHORTNESS OF BREATH *UNUSUAL BRUISING OR BLEEDING *URINARY PROBLEMS (pain or burning when urinating, or frequent urination) *BOWEL PROBLEMS (unusual diarrhea, constipation, pain near the anus) TENDERNESS IN MOUTH AND THROAT WITH OR WITHOUT PRESENCE OF ULCERS (sore throat, sores in mouth, or a toothache) UNUSUAL RASH, SWELLING OR PAIN  UNUSUAL VAGINAL DISCHARGE OR ITCHING   Items with * indicate a potential emergency and should be followed up as soon as possible or go to the Emergency Department if any problems should occur.  Please show the CHEMOTHERAPY ALERT CARD or IMMUNOTHERAPY ALERT CARD at check-in to the  Emergency Department and triage nurse.  Should you have questions after your visit or need to cancel or reschedule your appointment, please contact [facility]  [phone] and follow the prompts.  Office hours are 8:00 a.m. to 4:30 p.m. Monday - Friday. Please note that voicemails left after 4:00 p.m. may not be returned until the following business day.  We are closed weekends and major holidays. You have access to a nurse at all times for urgent questions. Please call the main number to the clinic [phone] and follow the prompts.  For any non-urgent questions, you may also contact your provider using MyChart. We now offer e-Visits for anyone 18 and older to request care online for non-urgent symptoms. For details visit [email or web address].   Also download the MyChart app! Go to the app store, search "MyChart", open the app, select [facility], and log in with your MyChart username and password.  Masks are optional in the cancer centers. If you would like for your care team to wear a mask while they are taking care of you, please let them know. For doctor visits, patients may have with them one support person who is at least 48 years old. At this time, visitors are not allowed in the infusion area.   

## 2022-09-09 NOTE — Progress Notes (Signed)
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  INTERVAL HISTORY: Patient returns to clinic today for further evaluation and consideration of cycle 2 of weekly cisplatin.  She is tolerating her XRT as well as her chemotherapy well without significant side effects.  She continues to have mild neck/ear pain, but otherwise feels well. She has no neurologic complaints.  She denies any recent fevers or illnesses.  She has good appetite and denies weight loss.  She has no chest pain, shortness of breath, cough, or hemoptysis.  She denies any nausea, vomiting, constipation, or diarrhea.  She has no urinary complaints.  Patient offers no further specific complaints today.  REVIEW OF SYSTEMS:   Review of Systems  Constitutional: Negative.  Negative for fever, malaise/fatigue and weight loss.  HENT:  Negative for sore throat.   Respiratory: Negative.  Negative for cough, hemoptysis and shortness of breath.   Cardiovascular: Negative.  Negative for chest pain and leg swelling.  Gastrointestinal: Negative.  Negative for abdominal pain.  Genitourinary: Negative.  Negative for dysuria.  Musculoskeletal:  Positive for neck pain.  Skin: Negative.  Negative for rash.  Neurological: Negative.  Negative for dizziness, focal weakness, weakness and headaches.  Psychiatric/Behavioral:  The patient is nervous/anxious.     As per HPI. Otherwise, a complete review of systems is negative.  PAST MEDICAL HISTORY: Past Medical History:  Diagnosis Date   Depression    Difficult intubation    Endometriosis     PAST SURGICAL HISTORY: Past Surgical History:  Procedure Laterality Date   ABDOMINAL HYSTERECTOMY  11/20/2018   [facility]   APPENDECTOMY   11/20/2018   [facility]   DIRECT LARYNGOSCOPY N/A 07/21/2022   Procedure: DIRECT MICROLARYNGOSCOPY;  Surgeon: Margaretha Sheffield, MD;  Location: [place];  Service: ENT;  Laterality: N/A;   LAPAROSCOPY  01/25/2018   Procedure: LAPAROSCOPY DIAGNOSTIC;  Surgeon: Will Bonnet, MD;  Location: [facility];  Service: Gynecology;;   TONGUE BIOPSY N/A 07/21/2022   Procedure: TONGUE BASE BIOPSY;  Surgeon: Margaretha Sheffield, MD;  Location: [place];  Service: ENT;  Laterality: N/A;    FAMILY HISTORY: Family History  Problem Relation Age of Onset   Cancer Maternal Uncle     ADVANCED DIRECTIVES (Y/N):  N  HEALTH MAINTENANCE: Social History   Tobacco Use   Smoking status: Former    Packs/day: 0.75    Years: 30.00    Total pack years: 22.50    Types: Cigarettes    Quit date: 07/05/2022    Years since quitting: 0.1   Smokeless tobacco: Never   Tobacco comments:       Vaping Use   Vaping Use: Never used  Substance Use Topics   Alcohol use: No   Drug use: No     Colonoscopy:  PAP:  Bone density:  Lipid panel:  Allergies  Allergen Reactions   Chlorhexidine Gluconate Rash    Current Outpatient Medications  Medication Sig Dispense Refill   HYDROcodone-acetaminophen (NORCO) 5-325 MG tablet Take 1 tablet by mouth every 6 (six) hours as needed for moderate pain. 60 tablet 0   naproxen (NAPROSYN) 500 MG tablet Take 1 tablet (500 mg total) by mouth 2 (two) times daily with a meal. (Patient not taking: Reported on 08/15/2022)  10 tablet 2   ondansetron (ZOFRAN) 8 MG tablet Take 1 tablet (8 mg total) by mouth every 8 (eight) hours as needed for nausea or vomiting. Start on the third day after cisplatin. 60 tablet 1   prochlorperazine (COMPAZINE) 10 MG tablet Take 1 tablet (10 mg total) by mouth every 6 (six) hours as needed (Nausea or vomiting). 60 tablet 1   No current facility-administered medications for this visit.    OBJECTIVE: Vitals:   09/09/22 0833  BP: 117/76   Pulse: 65  Resp: 18  Temp: (!) 97 F (36.1 C)  SpO2: 100%     Body mass index is 23.21 kg/m.    ECOG FS:0 - Asymptomatic  General: Well-developed, well-nourished, no acute distress. Eyes: Pink conjunctiva, anicteric sclera. HEENT: Normocephalic, moist mucous membranes.  No palpable lymphadenopathy. Lungs: No audible wheezing or coughing. Heart: Regular rate and rhythm. Abdomen: Soft, nontender, no obvious distention. Musculoskeletal: No edema, cyanosis, or clubbing. Neuro: Alert, answering all questions appropriately. Cranial nerves grossly intact. Skin: No rashes or petechiae noted. Psych: Normal affect.  LAB RESULTS:  Lab Results  Component Value Date   NA 136 09/09/2022   K 4.3 09/09/2022   CL 100 09/09/2022   CO2 27 09/09/2022   GLUCOSE 92 09/09/2022   BUN 8 09/09/2022   CREATININE 0.61 09/09/2022   CALCIUM 9.6 09/09/2022   PROT 8.2 (H) 07/04/2022   ALBUMIN 4.9 07/04/2022   AST 18 07/04/2022   ALT 14 07/04/2022   ALKPHOS 82 07/04/2022   BILITOT 0.6 07/04/2022   GFRNONAA >60 09/09/2022   GFRAA >60 01/15/2018    Lab Results  Component Value Date   WBC 6.8 09/09/2022   NEUTROABS 4.5 09/09/2022   HGB 14.2 09/09/2022   HCT 41.6 09/09/2022   MCV 89.8 09/09/2022   PLT 255 09/09/2022     STUDIES: No results found.  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  PET scan results from August 10, 2022 reviewed independently with hypermetabolic left base of tongue mass and minimally metabolic lymph node unclear if this is a metastasis or reactive.  Patient will benefit from concurrent chemotherapy using weekly cisplatin 40 mg/m2 with daily XRT.  We discussed possible port placement, but this is not necessary at this time.  Proceed with cycle 2 of  weekly cisplatin today.  Continue daily XRT.  Return to clinic in 1 week for further evaluation consideration of cycle 3.   Pain: Continue Norco as prescribed.  I spent a total of 30 minutes reviewing chart data, face-to-face evaluation with the patient, counseling and coordination of care as detailed above.    Patient expressed understanding and was in agreement with this plan. She also understands that She can call clinic at any time with any questions, concerns, or complaints.    Cancer Staging  Tongue cancer [facility]) Staging form: Oral Cavity, AJCC 8th Edition - Clinical stage from 08/05/2022: Stage III (cT3, cN1, cM0) - Signed by Lloyd Huger, MD on 08/05/2022 Stage prefix: Initial diagnosis   Lloyd Huger, MD   09/09/2022 8:51 AM

## 2022-09-09 NOTE — Progress Notes (Signed)
Neck pain and L ear pain 5/10 on pain scale. Pt is eating. Went over how to take nausea meds and what to use for constipation.

## 2022-09-12 ENCOUNTER — Other Ambulatory Visit: Payer: Self-pay

## 2022-09-12 ENCOUNTER — Ambulatory Visit
Admission: RE | Admit: 2022-09-12 | Discharge: 2022-09-12 | Disposition: A | Discharge status: Home / Self Care | Payer: BC Managed Care – PPO | Source: Ambulatory Visit | Attending: Radiation Oncology | Admitting: Radiation Oncology

## 2022-09-12 DIAGNOSIS — Z510 Encounter for antineoplastic radiation therapy: Secondary | ICD-10-CM | POA: Diagnosis not present

## 2022-09-12 LAB — RAD ONC ARIA SESSION SUMMARY
Course Elapsed Days: 7
Plan Fractions Treated to Date: 6
Plan Prescribed Dose Per Fraction: 2 Gy
Plan Total Fractions Prescribed: 35
Plan Total Prescribed Dose: 70 Gy
Reference Point Dosage Given to Date: 12 Gy
Reference Point Session Dosage Given: 2 Gy
Session Number: 6

## 2022-09-13 ENCOUNTER — Ambulatory Visit: Payer: BC Managed Care – PPO

## 2022-09-13 ENCOUNTER — Ambulatory Visit: Payer: BC Managed Care – PPO | Admitting: Oncology

## 2022-09-13 ENCOUNTER — Other Ambulatory Visit: Payer: BC Managed Care – PPO

## 2022-09-13 ENCOUNTER — Ambulatory Visit
Admission: RE | Admit: 2022-09-13 | Discharge: 2022-09-13 | Disposition: A | Discharge status: Home / Self Care | Payer: BC Managed Care – PPO | Source: Ambulatory Visit | Attending: Radiation Oncology | Admitting: Radiation Oncology

## 2022-09-13 ENCOUNTER — Other Ambulatory Visit: Payer: Self-pay

## 2022-09-13 DIAGNOSIS — Z510 Encounter for antineoplastic radiation therapy: Secondary | ICD-10-CM | POA: Diagnosis not present

## 2022-09-13 LAB — RAD ONC ARIA SESSION SUMMARY
Course Elapsed Days: 8
Plan Fractions Treated to Date: 7
Plan Prescribed Dose Per Fraction: 2 Gy
Plan Total Fractions Prescribed: 35
Plan Total Prescribed Dose: 70 Gy
Reference Point Dosage Given to Date: 14 Gy
Reference Point Session Dosage Given: 2 Gy
Session Number: 7

## 2022-09-14 ENCOUNTER — Ambulatory Visit: Payer: BC Managed Care – PPO

## 2022-09-14 ENCOUNTER — Other Ambulatory Visit: Payer: Self-pay

## 2022-09-14 ENCOUNTER — Ambulatory Visit: Payer: BC Managed Care – PPO | Admitting: Medical Oncology

## 2022-09-14 ENCOUNTER — Other Ambulatory Visit: Payer: BC Managed Care – PPO

## 2022-09-14 ENCOUNTER — Ambulatory Visit
Admission: RE | Admit: 2022-09-14 | Discharge: 2022-09-14 | Disposition: A | Discharge status: Home / Self Care | Payer: BC Managed Care – PPO | Source: Ambulatory Visit | Attending: Radiation Oncology | Admitting: Radiation Oncology

## 2022-09-14 DIAGNOSIS — Z510 Encounter for antineoplastic radiation therapy: Secondary | ICD-10-CM | POA: Diagnosis not present

## 2022-09-14 LAB — RAD ONC ARIA SESSION SUMMARY
Course Elapsed Days: 9
Plan Fractions Treated to Date: 8
Plan Prescribed Dose Per Fraction: 2 Gy
Plan Total Fractions Prescribed: 35
Plan Total Prescribed Dose: 70 Gy
Reference Point Dosage Given to Date: 16 Gy
Reference Point Session Dosage Given: 2 Gy
Session Number: 8

## 2022-09-15 ENCOUNTER — Other Ambulatory Visit: Payer: Self-pay | Admitting: *Deleted

## 2022-09-15 ENCOUNTER — Ambulatory Visit
Admission: RE | Admit: 2022-09-15 | Discharge: 2022-09-15 | Disposition: A | Discharge status: Home / Self Care | Payer: BC Managed Care – PPO | Source: Ambulatory Visit | Attending: Radiation Oncology | Admitting: Radiation Oncology

## 2022-09-15 ENCOUNTER — Other Ambulatory Visit: Payer: Self-pay

## 2022-09-15 DIAGNOSIS — Z510 Encounter for antineoplastic radiation therapy: Secondary | ICD-10-CM | POA: Diagnosis not present

## 2022-09-15 LAB — RAD ONC ARIA SESSION SUMMARY
Course Elapsed Days: 10
Plan Fractions Treated to Date: 9
Plan Prescribed Dose Per Fraction: 2 Gy
Plan Total Fractions Prescribed: 35
Plan Total Prescribed Dose: 70 Gy
Reference Point Dosage Given to Date: 18 Gy
Reference Point Session Dosage Given: 2 Gy
Session Number: 9

## 2022-09-15 MED ORDER — SUCRALFATE 1 G PO TABS: 1.0000 g | ORAL_TABLET | Freq: Three times a day (TID) | ORAL | 1 refills | Status: DC

## 2022-09-15 MED FILL — Dexamethasone Sodium Phosphate Inj 100 MG/10ML: INTRAMUSCULAR | Qty: 1 | Status: AC

## 2022-09-15 MED FILL — Fosaprepitant Dimeglumine For IV Infusion 150 MG (Base Eq): INTRAVENOUS | Qty: 5 | Status: AC

## 2022-09-16 ENCOUNTER — Inpatient Hospital Stay: Payer: BC Managed Care – PPO

## 2022-09-16 ENCOUNTER — Ambulatory Visit
Admission: RE | Admit: 2022-09-16 | Discharge: 2022-09-16 | Disposition: A | Discharge status: Home / Self Care | Payer: BC Managed Care – PPO | Source: Ambulatory Visit | Attending: Radiation Oncology | Admitting: Radiation Oncology

## 2022-09-16 ENCOUNTER — Other Ambulatory Visit: Payer: Self-pay

## 2022-09-16 ENCOUNTER — Inpatient Hospital Stay (HOSPITAL_BASED_OUTPATIENT_CLINIC_OR_DEPARTMENT_OTHER): Payer: BC Managed Care – PPO | Admitting: Medical Oncology

## 2022-09-16 ENCOUNTER — Other Ambulatory Visit: Payer: Self-pay | Admitting: *Deleted

## 2022-09-16 VITALS — BP 118/72 | HR 57 | Temp 96.7°F | Resp 18 | Ht 67.7 in | Wt 149.0 lb

## 2022-09-16 DIAGNOSIS — G893 Neoplasm related pain (acute) (chronic): Secondary | ICD-10-CM | POA: Diagnosis not present

## 2022-09-16 DIAGNOSIS — R634 Abnormal weight loss: Secondary | ICD-10-CM | POA: Diagnosis not present

## 2022-09-16 DIAGNOSIS — C029 Malignant neoplasm of tongue, unspecified: Secondary | ICD-10-CM

## 2022-09-16 DIAGNOSIS — Z510 Encounter for antineoplastic radiation therapy: Secondary | ICD-10-CM | POA: Diagnosis not present

## 2022-09-16 LAB — CBC WITH DIFFERENTIAL/PLATELET
Abs Immature Granulocytes: 0.03 10*3/uL (ref 0.00–0.07)
Basophils Absolute: 0 10*3/uL (ref 0.0–0.1)
Basophils Relative: 1 %
Eosinophils Absolute: 0.1 10*3/uL (ref 0.0–0.5)
Eosinophils Relative: 2 %
HCT: 38.7 % (ref 36.0–46.0)
Hemoglobin: 13.3 g/dL (ref 12.0–15.0)
Immature Granulocytes: 1 %
Lymphocytes Relative: 14 %
Lymphs Abs: 0.8 10*3/uL (ref 0.7–4.0)
MCH: 30.9 pg (ref 26.0–34.0)
MCHC: 34.4 g/dL (ref 30.0–36.0)
MCV: 90 fL (ref 80.0–100.0)
Monocytes Absolute: 0.5 10*3/uL (ref 0.1–1.0)
Monocytes Relative: 8 %
Neutro Abs: 4 10*3/uL (ref 1.7–7.7)
Neutrophils Relative %: 74 %
Platelets: 216 10*3/uL (ref 150–400)
RBC: 4.3 MIL/uL (ref 3.87–5.11)
RDW: 13.2 % (ref 11.5–15.5)
WBC: 5.4 10*3/uL (ref 4.0–10.5)
nRBC: 0 % (ref 0.0–0.2)

## 2022-09-16 LAB — BASIC METABOLIC PANEL
Anion gap: 7 (ref 5–15)
BUN: 7 mg/dL (ref 6–20)
CO2: 28 mmol/L (ref 22–32)
Calcium: 9.2 mg/dL (ref 8.9–10.3)
Chloride: 103 mmol/L (ref 98–111)
Creatinine, Ser: 0.72 mg/dL (ref 0.44–1.00)
GFR, Estimated: >60 mL/min (ref >60)
Glucose, Bld: 94 mg/dL (ref 70–99)
Potassium: 4.5 mmol/L (ref 3.5–5.1)
Sodium: 138 mmol/L (ref 135–145)

## 2022-09-16 LAB — RAD ONC ARIA SESSION SUMMARY
Course Elapsed Days: 11
Plan Fractions Treated to Date: 10
Plan Prescribed Dose Per Fraction: 2 Gy
Plan Total Fractions Prescribed: 35
Plan Total Prescribed Dose: 70 Gy
Reference Point Dosage Given to Date: 20 Gy
Reference Point Session Dosage Given: 2 Gy
Session Number: 10

## 2022-09-16 LAB — MAGNESIUM: Magnesium: 2.1 mg/dL (ref 1.7–2.4)

## 2022-09-16 MED ORDER — POTASSIUM CHLORIDE IN NACL 20-0.9 MEQ/L-% IV SOLN
Freq: Once | INTRAVENOUS | Status: AC
  Filled 2022-09-16: qty 1000

## 2022-09-16 MED ORDER — SODIUM CHLORIDE 0.9 % IV SOLN
150.0000 mg | Freq: Once | INTRAVENOUS | Status: AC
  Administered 2022-09-16: 150 mg via INTRAVENOUS
  Filled 2022-09-16: qty 150

## 2022-09-16 MED ORDER — PALONOSETRON HCL INJECTION 0.25 MG/5ML
0.2500 mg | Freq: Once | INTRAVENOUS | Status: AC
  Administered 2022-09-16: 0.25 mg via INTRAVENOUS
  Filled 2022-09-16: qty 5

## 2022-09-16 MED ORDER — SODIUM CHLORIDE 0.9 % IV SOLN
40.0000 mg/m2 | Freq: Once | INTRAVENOUS | Status: AC
  Administered 2022-09-16: 73 mg via INTRAVENOUS
  Filled 2022-09-16: qty 73

## 2022-09-16 MED ORDER — MAGNESIUM SULFATE 2 GM/50ML IV SOLN
2.0000 g | Freq: Once | INTRAVENOUS | Status: AC
  Administered 2022-09-16: 2 g via INTRAVENOUS
  Filled 2022-09-16: qty 50

## 2022-09-16 MED ORDER — SODIUM CHLORIDE 0.9 % IV SOLN
10.0000 mg | Freq: Once | INTRAVENOUS | Status: AC
  Administered 2022-09-16: 10 mg via INTRAVENOUS
  Filled 2022-09-16: qty 10

## 2022-09-16 MED ORDER — SODIUM CHLORIDE 0.9 % IV SOLN
Freq: Once | INTRAVENOUS | Status: AC
  Filled 2022-09-16: qty 250

## 2022-09-16 NOTE — Patient Instructions (Signed)
[facility]  Discharge Instructions: Thank you for choosing [facility] to provide your oncology and hematology care.  If you have a lab appointment with the Cancer Center, please go directly to the Cancer Center and check in at the registration area.  Wear comfortable clothing and clothing appropriate for easy access to any Portacath or PICC line.   We strive to give you quality time with your provider. You may need to reschedule your appointment if you arrive late (15 or more minutes).  Arriving late affects you and other patients whose appointments are after yours.  Also, if you miss three or more appointments without notifying the office, you may be dismissed from the clinic at the provider's discretion.      For prescription refill requests, have your pharmacy contact our office and allow 72 hours for refills to be completed.    Today you received the following chemotherapy and/or immunotherapy agents Cisplatin    To help prevent nausea and vomiting after your treatment, we encourage you to take your nausea medication as directed.  BELOW ARE SYMPTOMS THAT SHOULD BE REPORTED IMMEDIATELY: *FEVER GREATER THAN 100.4 F (38 C) OR HIGHER *CHILLS OR SWEATING *NAUSEA AND VOMITING THAT IS NOT CONTROLLED WITH YOUR NAUSEA MEDICATION *UNUSUAL SHORTNESS OF BREATH *UNUSUAL BRUISING OR BLEEDING *URINARY PROBLEMS (pain or burning when urinating, or frequent urination) *BOWEL PROBLEMS (unusual diarrhea, constipation, pain near the anus) TENDERNESS IN MOUTH AND THROAT WITH OR WITHOUT PRESENCE OF ULCERS (sore throat, sores in mouth, or a toothache) UNUSUAL RASH, SWELLING OR PAIN  UNUSUAL VAGINAL DISCHARGE OR ITCHING   Items with * indicate a potential emergency and should be followed up as soon as possible or go to the Emergency Department if any problems should occur.  Please show the CHEMOTHERAPY ALERT CARD or IMMUNOTHERAPY ALERT CARD at check-in to the  Emergency Department and triage nurse.  Should you have questions after your visit or need to cancel or reschedule your appointment, please contact [facility]  [phone] and follow the prompts.  Office hours are 8:00 a.m. to 4:30 p.m. Monday - Friday. Please note that voicemails left after 4:00 p.m. may not be returned until the following business day.  We are closed weekends and major holidays. You have access to a nurse at all times for urgent questions. Please call the main number to the clinic [phone] and follow the prompts.  For any non-urgent questions, you may also contact your provider using MyChart. We now offer e-Visits for anyone 18 and older to request care online for non-urgent symptoms. For details visit [email or web address].   Also download the MyChart app! Go to the app store, search "MyChart", open the app, select [facility], and log in with your MyChart username and password.  Masks are optional in the cancer centers. If you would like for your care team to wear a mask while they are taking care of you, please let them know. For doctor visits, patients may have with them one support person who is at least 48 years old. At this time, visitors are not allowed in the infusion area.   

## 2022-09-16 NOTE — Progress Notes (Signed)
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  INTERVAL HISTORY: Patient returns to clinic today for further evaluation and consideration of cycle 3 of weekly cisplatin.  She states that she is doing well. She is tolerating her XRT as well as her chemotherapy well without significant side effects.  She continues to have mild left neck/ear pain, and mild constipation but otherwise feels well. She has no neurologic complaints.  She denies any recent fevers or illnesses.  She has good appetite and denies weight loss.  She has no chest pain, shortness of breath, cough, or hemoptysis.  She denies any nausea, vomiting, or diarrhea.  She has no urinary complaints.  Patient offers no further specific complaints today.  Wt Readings from Last 3 Encounters:  09/16/22 149 lb (67.6 kg)  09/09/22 151 lb 4.8 oz (68.6 kg)  08/30/22 153 lb (69.4 kg)     REVIEW OF SYSTEMS:   Review of Systems  Constitutional: Negative.  Negative for fever, malaise/fatigue and weight loss.  HENT:  Negative for sore throat.   Respiratory: Negative.  Negative for cough, hemoptysis and shortness of breath.   Cardiovascular: Negative.  Negative for chest pain and leg swelling.  Gastrointestinal: Negative.  Negative for abdominal pain.  Genitourinary: Negative.  Negative for dysuria.  Musculoskeletal:  Positive for neck pain.  Skin: Negative.  Negative for rash.  Neurological: Negative.  Negative for dizziness, focal weakness, weakness and headaches.  Psychiatric/Behavioral:  The patient is nervous/anxious.     As per HPI. Otherwise, a complete review of systems is negative.  PAST MEDICAL HISTORY: Past Medical History:  Diagnosis Date   Depression     Difficult intubation    Endometriosis     PAST SURGICAL HISTORY: Past Surgical History:  Procedure Laterality Date   ABDOMINAL HYSTERECTOMY  11/20/2018   [facility]   APPENDECTOMY  11/20/2018   [facility]   DIRECT LARYNGOSCOPY N/A 07/21/2022   Procedure: DIRECT MICROLARYNGOSCOPY;  Surgeon: Margaretha Sheffield, MD;  Location: [place];  Service: ENT;  Laterality: N/A;   LAPAROSCOPY  01/25/2018   Procedure: LAPAROSCOPY DIAGNOSTIC;  Surgeon: Will Bonnet, MD;  Location: [facility];  Service: Gynecology;;   TONGUE BIOPSY N/A 07/21/2022   Procedure: TONGUE BASE BIOPSY;  Surgeon: Margaretha Sheffield, MD;  Location: [place];  Service: ENT;  Laterality: N/A;    FAMILY HISTORY: Family History  Problem Relation Age of Onset   Cancer Maternal Uncle     ADVANCED DIRECTIVES (Y/N):  N  HEALTH MAINTENANCE: Social History   Tobacco Use   Smoking status: Former    Packs/day: 0.75    Years: 30.00    Total pack years: 22.50    Types: Cigarettes    Quit date: 07/05/2022    Years since quitting: 0.2   Smokeless tobacco: Never   Tobacco comments:       Vaping Use   Vaping Use: Never used  Substance Use Topics   Alcohol use: No   Drug use: No     Colonoscopy:  PAP:  Bone density:  Lipid panel:  Allergies  Allergen Reactions   Chlorhexidine Gluconate Rash    Current Outpatient Medications  Medication Sig Dispense Refill   HYDROcodone-acetaminophen (NORCO) 5-325 MG tablet Take 1 tablet by mouth  every 6 (six) hours as needed for moderate pain. 60 tablet 0   ondansetron (ZOFRAN) 8 MG tablet Take 1 tablet (8 mg total) by mouth every 8 (eight) hours as needed for nausea or vomiting. Start on the third day after cisplatin. 60 tablet 1   prochlorperazine (COMPAZINE) 10 MG tablet Take 1 tablet (10 mg total) by mouth every 6 (six) hours as needed (Nausea or vomiting). 60 tablet 1   naproxen (NAPROSYN) 500 MG tablet Take 1 tablet (500 mg total) by mouth 2 (two) times daily with a meal.  (Patient not taking: Reported on 08/15/2022) 10 tablet 2   sucralfate (CARAFATE) 1 g tablet Take 1 tablet (1 g total) by mouth 4 (four) times daily -  with meals and at bedtime. Dissolve tablet in 3 to 4 tablespoons of warm. (Patient not taking: Reported on 09/16/2022) 90 tablet 1   No current facility-administered medications for this visit.    OBJECTIVE: Vitals:   09/16/22 0845  BP: 118/72  Pulse: (!) 57  Resp: 18  Temp: (!) 96.7 F (35.9 C)     Body mass index is 22.86 kg/m.    ECOG FS:0 - Asymptomatic  General: Well-developed, well-nourished, no acute distress. Eyes: Pink conjunctiva, anicteric sclera. HEENT: Normocephalic, moist mucous membranes.  No palpable lymphadenopathy. Lungs: No audible wheezing or coughing. Heart: Regular rate and rhythm. Abdomen: Soft, nontender, no obvious distention. Musculoskeletal: No edema, cyanosis, or clubbing. Neuro: Alert, answering all questions appropriately. Cranial nerves grossly intact. Skin: No rashes or petechiae noted. Psych: Normal affect.  LAB RESULTS:  Lab Results  Component Value Date   NA 138 09/16/2022   K 4.5 09/16/2022   CL 103 09/16/2022   CO2 28 09/16/2022   GLUCOSE 94 09/16/2022   BUN 7 09/16/2022   CREATININE 0.72 09/16/2022   CALCIUM 9.2 09/16/2022   PROT 8.2 (H) 07/04/2022   ALBUMIN 4.9 07/04/2022   AST 18 07/04/2022   ALT 14 07/04/2022   ALKPHOS 82 07/04/2022   BILITOT 0.6 07/04/2022   GFRNONAA >60 09/16/2022   GFRAA >60 01/15/2018    Lab Results  Component Value Date   WBC 5.4 09/16/2022   NEUTROABS 4.0 09/16/2022   HGB 13.3 09/16/2022   HCT 38.7 09/16/2022   MCV 90.0 09/16/2022   PLT 216 09/16/2022     STUDIES: No results found.  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  PET scan results from August 10, 2022 reviewed independently with hypermetabolic left base of tongue mass and minimally metabolic lymph node unclear if this is a metastasis or reactive.  Patient will benefit from concurrent chemotherapy using weekly cisplatin 40 mg/m2 with daily XRT. Labs reviewed with patient and are acceptable for treatment. Today we will proceed with cycle 3 of weekly cisplatin today.  Continue daily XRT.  Return to clinic in 1 week for further evaluation consideration of cycle 4.   Pain: Continue Norco as prescribed. No changes to plan at this time but we will plan a nutrition consult given her pain with eating  Loss of Weight: Mild but given her status and pain I have submitted a nutrition consult   Disposition Cycle 3 Weekly Cisplatin today RTC 1 week MD, labs (CBC w/, BMP, mag), +- Cisplatin cycle 4   I  spent a total of 30 minutes reviewing chart data, face-to-face evaluation with the patient, counseling and coordination of care as detailed above.    Patient expressed understanding and was in agreement with this plan. She also understands that She can call clinic at any time with any questions, concerns, or complaints.    Cancer Staging  Tongue cancer [facility]) Staging form: Oral Cavity, AJCC 8th Edition - Clinical stage from 08/05/2022: Stage III (cT3, cN1, cM0) - Signed by Lloyd Huger, MD on 08/05/2022 Stage prefix: Initial diagnosis   Hughie Closs, PA-C   09/16/2022 9:39 AM

## 2022-09-20 ENCOUNTER — Other Ambulatory Visit: Payer: BC Managed Care – PPO

## 2022-09-20 ENCOUNTER — Ambulatory Visit: Payer: BC Managed Care – PPO

## 2022-09-20 ENCOUNTER — Ambulatory Visit
Admission: RE | Admit: 2022-09-20 | Discharge: 2022-09-20 | Disposition: A | Discharge status: Home / Self Care | Payer: BC Managed Care – PPO | Source: Ambulatory Visit | Attending: Radiation Oncology | Admitting: Radiation Oncology

## 2022-09-20 ENCOUNTER — Ambulatory Visit: Payer: BC Managed Care – PPO | Admitting: Oncology

## 2022-09-20 ENCOUNTER — Other Ambulatory Visit: Payer: Self-pay

## 2022-09-20 DIAGNOSIS — Z510 Encounter for antineoplastic radiation therapy: Secondary | ICD-10-CM | POA: Diagnosis not present

## 2022-09-20 LAB — RAD ONC ARIA SESSION SUMMARY
Course Elapsed Days: 15
Plan Fractions Treated to Date: 11
Plan Prescribed Dose Per Fraction: 2 Gy
Plan Total Fractions Prescribed: 35
Plan Total Prescribed Dose: 70 Gy
Reference Point Dosage Given to Date: 22 Gy
Reference Point Session Dosage Given: 2 Gy
Session Number: 11

## 2022-09-21 ENCOUNTER — Other Ambulatory Visit: Payer: BC Managed Care – PPO

## 2022-09-21 ENCOUNTER — Ambulatory Visit: Payer: BC Managed Care – PPO | Admitting: Oncology

## 2022-09-21 ENCOUNTER — Ambulatory Visit
Admission: RE | Admit: 2022-09-21 | Discharge: 2022-09-21 | Disposition: A | Discharge status: Home / Self Care | Payer: BC Managed Care – PPO | Source: Ambulatory Visit | Attending: Radiation Oncology | Admitting: Radiation Oncology

## 2022-09-21 ENCOUNTER — Ambulatory Visit: Payer: BC Managed Care – PPO

## 2022-09-21 ENCOUNTER — Other Ambulatory Visit: Payer: Self-pay

## 2022-09-21 DIAGNOSIS — Z510 Encounter for antineoplastic radiation therapy: Secondary | ICD-10-CM | POA: Diagnosis not present

## 2022-09-21 LAB — RAD ONC ARIA SESSION SUMMARY
Course Elapsed Days: 16
Plan Fractions Treated to Date: 12
Plan Prescribed Dose Per Fraction: 2 Gy
Plan Total Fractions Prescribed: 35
Plan Total Prescribed Dose: 70 Gy
Reference Point Dosage Given to Date: 24 Gy
Reference Point Session Dosage Given: 2 Gy
Session Number: 12

## 2022-09-22 ENCOUNTER — Ambulatory Visit
Admission: RE | Admit: 2022-09-22 | Discharge: 2022-09-22 | Disposition: A | Discharge status: Home / Self Care | Payer: BC Managed Care – PPO | Source: Ambulatory Visit | Attending: Radiation Oncology | Admitting: Radiation Oncology

## 2022-09-22 ENCOUNTER — Other Ambulatory Visit: Payer: Self-pay

## 2022-09-22 DIAGNOSIS — Z510 Encounter for antineoplastic radiation therapy: Secondary | ICD-10-CM | POA: Diagnosis not present

## 2022-09-22 LAB — RAD ONC ARIA SESSION SUMMARY
Course Elapsed Days: 17
Plan Fractions Treated to Date: 13
Plan Prescribed Dose Per Fraction: 2 Gy
Plan Total Fractions Prescribed: 35
Plan Total Prescribed Dose: 70 Gy
Reference Point Dosage Given to Date: 26 Gy
Reference Point Session Dosage Given: 2 Gy
Session Number: 13

## 2022-09-22 MED FILL — Dexamethasone Sodium Phosphate Inj 100 MG/10ML: INTRAMUSCULAR | Qty: 1 | Status: AC

## 2022-09-22 MED FILL — Fosaprepitant Dimeglumine For IV Infusion 150 MG (Base Eq): INTRAVENOUS | Qty: 5 | Status: AC

## 2022-09-23 ENCOUNTER — Inpatient Hospital Stay (HOSPITAL_BASED_OUTPATIENT_CLINIC_OR_DEPARTMENT_OTHER): Payer: BC Managed Care – PPO | Admitting: Oncology

## 2022-09-23 ENCOUNTER — Encounter: Payer: Self-pay | Admitting: Oncology

## 2022-09-23 ENCOUNTER — Ambulatory Visit
Admission: RE | Admit: 2022-09-23 | Discharge: 2022-09-23 | Disposition: A | Discharge status: Home / Self Care | Payer: BC Managed Care – PPO | Source: Ambulatory Visit | Attending: Radiation Oncology | Admitting: Radiation Oncology

## 2022-09-23 ENCOUNTER — Inpatient Hospital Stay: Payer: BC Managed Care – PPO

## 2022-09-23 ENCOUNTER — Other Ambulatory Visit: Payer: Self-pay

## 2022-09-23 ENCOUNTER — Telehealth: Payer: Self-pay

## 2022-09-23 DIAGNOSIS — C029 Malignant neoplasm of tongue, unspecified: Secondary | ICD-10-CM | POA: Diagnosis not present

## 2022-09-23 DIAGNOSIS — Z510 Encounter for antineoplastic radiation therapy: Secondary | ICD-10-CM | POA: Diagnosis not present

## 2022-09-23 LAB — RAD ONC ARIA SESSION SUMMARY
Course Elapsed Days: 18
Plan Fractions Treated to Date: 14
Plan Prescribed Dose Per Fraction: 2 Gy
Plan Total Fractions Prescribed: 35
Plan Total Prescribed Dose: 70 Gy
Reference Point Dosage Given to Date: 28 Gy
Reference Point Session Dosage Given: 2 Gy
Session Number: 14

## 2022-09-23 LAB — BASIC METABOLIC PANEL
Anion gap: 9 (ref 5–15)
BUN: 9 mg/dL (ref 6–20)
CO2: 24 mmol/L (ref 22–32)
Calcium: 9.3 mg/dL (ref 8.9–10.3)
Chloride: 104 mmol/L (ref 98–111)
Creatinine, Ser: 0.7 mg/dL (ref 0.44–1.00)
GFR, Estimated: >60 mL/min (ref >60)
Glucose, Bld: 100 mg/dL — ABNORMAL HIGH (ref 70–99)
Potassium: 3.8 mmol/L (ref 3.5–5.1)
Sodium: 137 mmol/L (ref 135–145)

## 2022-09-23 LAB — CBC WITH DIFFERENTIAL/PLATELET
Abs Immature Granulocytes: 0.02 10*3/uL (ref 0.00–0.07)
Basophils Absolute: 0 10*3/uL (ref 0.0–0.1)
Basophils Relative: 1 %
Eosinophils Absolute: 0.1 10*3/uL (ref 0.0–0.5)
Eosinophils Relative: 2 %
HCT: 39.3 % (ref 36.0–46.0)
Hemoglobin: 13.5 g/dL (ref 12.0–15.0)
Immature Granulocytes: 0 %
Lymphocytes Relative: 14 %
Lymphs Abs: 0.8 10*3/uL (ref 0.7–4.0)
MCH: 30.5 pg (ref 26.0–34.0)
MCHC: 34.4 g/dL (ref 30.0–36.0)
MCV: 88.9 fL (ref 80.0–100.0)
Monocytes Absolute: 0.4 10*3/uL (ref 0.1–1.0)
Monocytes Relative: 8 %
Neutro Abs: 4 10*3/uL (ref 1.7–7.7)
Neutrophils Relative %: 75 %
Platelets: 232 10*3/uL (ref 150–400)
RBC: 4.42 MIL/uL (ref 3.87–5.11)
RDW: 13 % (ref 11.5–15.5)
WBC: 5.3 10*3/uL (ref 4.0–10.5)
nRBC: 0 % (ref 0.0–0.2)

## 2022-09-23 LAB — MAGNESIUM: Magnesium: 2.3 mg/dL (ref 1.7–2.4)

## 2022-09-23 MED ORDER — SODIUM CHLORIDE 0.9 % IV SOLN
40.0000 mg/m2 | Freq: Once | INTRAVENOUS | Status: AC
  Administered 2022-09-23: 73 mg via INTRAVENOUS
  Filled 2022-09-23: qty 73

## 2022-09-23 MED ORDER — PALONOSETRON HCL INJECTION 0.25 MG/5ML
0.2500 mg | Freq: Once | INTRAVENOUS | Status: AC
  Administered 2022-09-23: 0.25 mg via INTRAVENOUS
  Filled 2022-09-23: qty 5

## 2022-09-23 MED ORDER — POTASSIUM CHLORIDE IN NACL 20-0.9 MEQ/L-% IV SOLN
Freq: Once | INTRAVENOUS | Status: AC
  Filled 2022-09-23: qty 1000

## 2022-09-23 MED ORDER — MAGNESIUM SULFATE 2 GM/50ML IV SOLN
2.0000 g | Freq: Once | INTRAVENOUS | Status: AC
  Administered 2022-09-23: 2 g via INTRAVENOUS
  Filled 2022-09-23: qty 50

## 2022-09-23 MED ORDER — SODIUM CHLORIDE 0.9 % IV SOLN
Freq: Once | INTRAVENOUS | Status: AC
  Filled 2022-09-23: qty 250

## 2022-09-23 MED ORDER — SODIUM CHLORIDE 0.9 % IV SOLN
10.0000 mg | Freq: Once | INTRAVENOUS | Status: AC
  Administered 2022-09-23: 10 mg via INTRAVENOUS
  Filled 2022-09-23: qty 10

## 2022-09-23 MED ORDER — SODIUM CHLORIDE 0.9 % IV SOLN
150.0000 mg | Freq: Once | INTRAVENOUS | Status: AC
  Administered 2022-09-23: 150 mg via INTRAVENOUS
  Filled 2022-09-23: qty 150

## 2022-09-23 NOTE — Telephone Encounter (Signed)
Port placement form faxed to [facility] for port placement.

## 2022-09-23 NOTE — Progress Notes (Signed)
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  INTERVAL HISTORY: Patient returns to clinic today for further evaluation and consideration of cycle 4 of weekly cisplatin.  She continues to tolerate her treatments well without significant side effects.  He has difficulty gaining IV access on her and she has not requested a port.  She otherwise feels well.  She does not complain of pain today.  She has no neurologic complaints.  She denies any recent fevers or illnesses.  She has a good appetite and denies weight loss.  She has no chest pain, shortness of breath, cough, or hemoptysis.  She denies any nausea, vomiting, constipation, or diarrhea.  She has no urinary complaints.  Patient offers no further specific complaints today.  REVIEW OF SYSTEMS:   Review of Systems  Constitutional: Negative.  Negative for fever, malaise/fatigue and weight loss.  HENT:  Negative for sore throat.   Respiratory: Negative.  Negative for cough, hemoptysis and shortness of breath.   Cardiovascular: Negative.  Negative for chest pain and leg swelling.  Gastrointestinal: Negative.  Negative for abdominal pain.  Genitourinary: Negative.  Negative for dysuria.  Musculoskeletal:  Negative for neck pain.  Skin: Negative.  Negative for rash.  Neurological: Negative.  Negative for dizziness, focal weakness, weakness and headaches.  Psychiatric/Behavioral: Negative.  The patient is not nervous/anxious.     As per HPI. Otherwise, a complete review of systems is negative.  PAST MEDICAL HISTORY: Past Medical History:  Diagnosis Date   Depression    Difficult intubation    Endometriosis     PAST SURGICAL HISTORY: Past Surgical History:  Procedure  Laterality Date   ABDOMINAL HYSTERECTOMY  11/20/2018   [facility]   APPENDECTOMY  11/20/2018   [facility]   DIRECT LARYNGOSCOPY N/A 07/21/2022   Procedure: DIRECT MICROLARYNGOSCOPY;  Surgeon: Margaretha Sheffield, MD;  Location: [place];  Service: ENT;  Laterality: N/A;   LAPAROSCOPY  01/25/2018   Procedure: LAPAROSCOPY DIAGNOSTIC;  Surgeon: Will Bonnet, MD;  Location: [facility];  Service: Gynecology;;   TONGUE BIOPSY N/A 07/21/2022   Procedure: TONGUE BASE BIOPSY;  Surgeon: Margaretha Sheffield, MD;  Location: [place];  Service: ENT;  Laterality: N/A;    FAMILY HISTORY: Family History  Problem Relation Age of Onset   Cancer Maternal Uncle     ADVANCED DIRECTIVES (Y/N):  N  HEALTH MAINTENANCE: Social History   Tobacco Use   Smoking status: Former    Packs/day: 0.75    Years: 30.00    Total pack years: 22.50    Types: Cigarettes    Quit date: 07/05/2022    Years since quitting: 0.2   Smokeless tobacco: Never   Tobacco comments:       Vaping Use   Vaping Use: Never used  Substance Use Topics   Alcohol use: No   Drug use: No     Colonoscopy:  PAP:  Bone density:  Lipid panel:  Allergies  Allergen Reactions   Chlorhexidine Gluconate Rash    Current Outpatient Medications  Medication Sig Dispense Refill   HYDROcodone-acetaminophen (NORCO) 5-325 MG tablet Take 1 tablet by mouth every 6 (six) hours as needed for moderate pain. 60 tablet 0   ondansetron (ZOFRAN) 8 MG tablet Take 1 tablet (8  mg total) by mouth every 8 (eight) hours as needed for nausea or vomiting. Start on the third day after cisplatin. 60 tablet 1   prochlorperazine (COMPAZINE) 10 MG tablet Take 1 tablet (10 mg total) by mouth every 6 (six) hours as needed (Nausea or vomiting). 60 tablet 1   naproxen (NAPROSYN) 500 MG tablet Take 1 tablet (500 mg total) by mouth 2 (two) times daily with a meal. (Patient not taking: Reported on 08/15/2022) 10 tablet 2   sucralfate (CARAFATE) 1 g tablet Take 1  tablet (1 g total) by mouth 4 (four) times daily -  with meals and at bedtime. Dissolve tablet in 3 to 4 tablespoons of warm. (Patient not taking: Reported on 09/16/2022) 90 tablet 1   No current facility-administered medications for this visit.   Facility-Administered Medications Ordered in Other Visits  Medication Dose Route Frequency Provider Last Rate Last Admin   CISplatin (PLATINOL) 73 mg in sodium chloride 0.9 % 250 mL chemo infusion  40 mg/m2 (Treatment Plan Recorded) Intravenous Once Lloyd Huger, MD       dexamethasone (DECADRON) 10 mg in sodium chloride 0.9 % 50 mL IVPB  10 mg Intravenous Once Lloyd Huger, MD       fosaprepitant (EMEND) 150 mg in sodium chloride 0.9 % 145 mL IVPB  150 mg Intravenous Once Lloyd Huger, MD       palonosetron (ALOXI) injection 0.25 mg  0.25 mg Intravenous Once Lloyd Huger, MD        OBJECTIVE: Vitals:   09/23/22 0832  BP: 135/83  Pulse: 63  Temp: (!) 96.7 F (35.9 C)     Body mass index is 22.24 kg/m.    ECOG FS:0 - Asymptomatic  General: Well-developed, well-nourished, no acute distress. Eyes: Pink conjunctiva, anicteric sclera. HEENT: Normocephalic, moist mucous membranes.  No palpable lymphadenopathy. Lungs: No audible wheezing or coughing. Heart: Regular rate and rhythm. Abdomen: Soft, nontender, no obvious distention. Musculoskeletal: No edema, cyanosis, or clubbing. Neuro: Alert, answering all questions appropriately. Cranial nerves grossly intact. Skin: No rashes or petechiae noted. Psych: Normal affect.  LAB RESULTS:  Lab Results  Component Value Date   NA 137 09/23/2022   K 3.8 09/23/2022   CL 104 09/23/2022   CO2 24 09/23/2022   GLUCOSE 100 (H) 09/23/2022   BUN 9 09/23/2022   CREATININE 0.70 09/23/2022   CALCIUM 9.3 09/23/2022   PROT 8.2 (H) 07/04/2022   ALBUMIN 4.9 07/04/2022   AST 18 07/04/2022   ALT 14 07/04/2022   ALKPHOS 82 07/04/2022   BILITOT 0.6 07/04/2022   GFRNONAA >60  09/23/2022   GFRAA >60 01/15/2018    Lab Results  Component Value Date   WBC 5.3 09/23/2022   NEUTROABS 4.0 09/23/2022   HGB 13.5 09/23/2022   HCT 39.3 09/23/2022   MCV 88.9 09/23/2022   PLT 232 09/23/2022     STUDIES: No results found.  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  PET scan results from August 10, 2022 reviewed independently with hypermetabolic left base of tongue mass and minimally metabolic lymph node unclear if this is a metastasis or reactive.  Patient will benefit from concurrent chemotherapy using weekly cisplatin 40 mg/m2 with daily XRT.  Patient has difficult IV access, therefore a  referral has been sent to IR for port placement.  Proceed with cycle 4 of weekly cisplatin today.  Continue daily XRT.  Return to clinic in 1 week for further evaluation and consideration of cycle 5.   Pain: Patient does not complain of this today.  Continue Norco as prescribed. IV access: Referral for port as above. Dysphagia: Mild.  Continue Carafate as prescribed.   Patient expressed understanding and was in agreement with this plan. She also understands that She can call clinic at any time with any questions, concerns, or complaints.    Cancer Staging  Tongue cancer [facility]) Staging form: Oral Cavity, AJCC 8th Edition - Clinical stage from 08/05/2022: Stage III (cT3, cN1, cM0) - Signed by Lloyd Huger, MD on 08/05/2022 Stage prefix: Initial diagnosis   Lloyd Huger, MD   09/23/2022 10:35 AM

## 2022-09-23 NOTE — Patient Instructions (Signed)
[facility] CANCER CTR AT [place]  Discharge Instructions: Thank you for choosing [facility] to provide your oncology and hematology care.  If you have a lab appointment with the [facility], please go directly to the [facility] and check in at the registration area.  Wear comfortable clothing and clothing appropriate for easy access to any Portacath or PICC line.   We strive to give you quality time with your provider. You may need to reschedule your appointment if you arrive late (15 or more minutes).  Arriving late affects you and other patients whose appointments are after yours.  Also, if you miss three or more appointments without notifying the office, you may be dismissed from the clinic at the provider's discretion.      For prescription refill requests, have your pharmacy contact our office and allow 72 hours for refills to be completed.    Today you received the following chemotherapy and/or immunotherapy agents Cisplatin      To help prevent nausea and vomiting after your treatment, we encourage you to take your nausea medication as directed.  BELOW ARE SYMPTOMS THAT SHOULD BE REPORTED IMMEDIATELY: *FEVER GREATER THAN 100.4 F (38 C) OR HIGHER *CHILLS OR SWEATING *NAUSEA AND VOMITING THAT IS NOT CONTROLLED WITH YOUR NAUSEA MEDICATION *UNUSUAL SHORTNESS OF BREATH *UNUSUAL BRUISING OR BLEEDING *URINARY PROBLEMS (pain or burning when urinating, or frequent urination) *BOWEL PROBLEMS (unusual diarrhea, constipation, pain near the anus) TENDERNESS IN MOUTH AND THROAT WITH OR WITHOUT PRESENCE OF ULCERS (sore throat, sores in mouth, or a toothache) UNUSUAL RASH, SWELLING OR PAIN  UNUSUAL VAGINAL DISCHARGE OR ITCHING   Items with * indicate a potential emergency and should be followed up as soon as possible or go to the Emergency Department if any problems should occur.  Please show the CHEMOTHERAPY ALERT CARD or IMMUNOTHERAPY ALERT CARD at check-in to  the Emergency Department and triage nurse.  Should you have questions after your visit or need to cancel or reschedule your appointment, please contact [facility] CANCER [place] AT [place]  [phone] and follow the prompts.  Office hours are 8:00 a.m. to 4:30 p.m. Monday - Friday. Please note that voicemails left after 4:00 p.m. may not be returned until the following business day.  We are closed weekends and major holidays. You have access to a nurse at all times for urgent questions. Please call the main number to the clinic [phone] and follow the prompts.  For any non-urgent questions, you may also contact your provider using MyChart. We now offer e-Visits for anyone 86 and older to request care online for non-urgent symptoms. For details visit [email or web address].   Also download the MyChart app! Go to the app store, search "MyChart", open the app, select [place], and log in with your MyChart username and password.

## 2022-09-23 NOTE — Progress Notes (Signed)
Per Dr. Finnegan, ok to run cisplatin concurrently with post hydration fluids 

## 2022-09-27 ENCOUNTER — Other Ambulatory Visit: Payer: Self-pay

## 2022-09-27 ENCOUNTER — Telehealth: Payer: Self-pay | Admitting: *Deleted

## 2022-09-27 ENCOUNTER — Ambulatory Visit
Admission: RE | Admit: 2022-09-27 | Discharge: 2022-09-27 | Disposition: A | Discharge status: Home / Self Care | Payer: BC Managed Care – PPO | Source: Ambulatory Visit | Attending: Radiation Oncology | Admitting: Radiation Oncology

## 2022-09-27 DIAGNOSIS — Z510 Encounter for antineoplastic radiation therapy: Secondary | ICD-10-CM | POA: Diagnosis not present

## 2022-09-27 DIAGNOSIS — Z87891 Personal history of nicotine dependence: Secondary | ICD-10-CM | POA: Insufficient documentation

## 2022-09-27 DIAGNOSIS — C01 Malignant neoplasm of base of tongue: Secondary | ICD-10-CM | POA: Diagnosis present

## 2022-09-27 LAB — RAD ONC ARIA SESSION SUMMARY
Course Elapsed Days: 22
Plan Fractions Treated to Date: 15
Plan Prescribed Dose Per Fraction: 2 Gy
Plan Total Fractions Prescribed: 35
Plan Total Prescribed Dose: 70 Gy
Reference Point Dosage Given to Date: 30 Gy
Reference Point Session Dosage Given: 2 Gy
Session Number: 15

## 2022-09-27 NOTE — Telephone Encounter (Signed)
Confirmed port placement appt for 09/29/22. Arrival time 7:30 am at Heart and Vascular location. NPO for  8 hours. She will have a driver with her.

## 2022-09-28 ENCOUNTER — Other Ambulatory Visit: Payer: Self-pay

## 2022-09-28 ENCOUNTER — Ambulatory Visit: Payer: BC Managed Care – PPO

## 2022-09-28 ENCOUNTER — Ambulatory Visit
Admission: RE | Admit: 2022-09-28 | Discharge: 2022-09-28 | Disposition: A | Discharge status: Home / Self Care | Payer: BC Managed Care – PPO | Source: Ambulatory Visit | Attending: Radiation Oncology | Admitting: Radiation Oncology

## 2022-09-28 ENCOUNTER — Ambulatory Visit: Payer: BC Managed Care – PPO | Admitting: Oncology

## 2022-09-28 ENCOUNTER — Other Ambulatory Visit: Payer: BC Managed Care – PPO

## 2022-09-28 ENCOUNTER — Other Ambulatory Visit: Payer: Self-pay | Admitting: Student

## 2022-09-28 DIAGNOSIS — Z510 Encounter for antineoplastic radiation therapy: Secondary | ICD-10-CM | POA: Diagnosis not present

## 2022-09-28 DIAGNOSIS — C029 Malignant neoplasm of tongue, unspecified: Secondary | ICD-10-CM

## 2022-09-28 LAB — RAD ONC ARIA SESSION SUMMARY
Course Elapsed Days: 23
Plan Fractions Treated to Date: 16
Plan Prescribed Dose Per Fraction: 2 Gy
Plan Total Fractions Prescribed: 35
Plan Total Prescribed Dose: 70 Gy
Reference Point Dosage Given to Date: 32 Gy
Reference Point Session Dosage Given: 2 Gy
Session Number: 16

## 2022-09-28 NOTE — Progress Notes (Signed)
Patient for IR Port Placement on Thurs 09/29/2022, I called and spoke with the patient on the phone and gave pre-procedure instructions. Pt was made aware to be here at 7:30a at the new entrance, NPO after MN prior to procedure as well as driver post procedure/recovery/discharge. Pt stated understanding. Called 09/28/2022

## 2022-09-29 ENCOUNTER — Ambulatory Visit: Payer: BC Managed Care – PPO

## 2022-09-29 ENCOUNTER — Ambulatory Visit
Admission: RE | Admit: 2022-09-29 | Discharge: 2022-09-29 | Disposition: A | Discharge status: Home / Self Care | Payer: BC Managed Care – PPO | Source: Ambulatory Visit | Attending: Oncology | Admitting: Oncology

## 2022-09-29 ENCOUNTER — Encounter: Payer: Self-pay | Admitting: Certified Registered"

## 2022-09-29 ENCOUNTER — Other Ambulatory Visit: Payer: Self-pay

## 2022-09-29 ENCOUNTER — Encounter: Payer: Self-pay | Admitting: Radiology

## 2022-09-29 DIAGNOSIS — Z87891 Personal history of nicotine dependence: Secondary | ICD-10-CM | POA: Insufficient documentation

## 2022-09-29 DIAGNOSIS — C029 Malignant neoplasm of tongue, unspecified: Secondary | ICD-10-CM

## 2022-09-29 HISTORY — PX: IR IMAGING GUIDED PORT INSERTION: IMG5740

## 2022-09-29 HISTORY — DX: Malignant neoplasm of tongue, unspecified: C02.9

## 2022-09-29 MED ORDER — FENTANYL CITRATE (PF) 100 MCG/2ML IJ SOLN
INTRAMUSCULAR | Status: AC
  Filled 2022-09-29: qty 2

## 2022-09-29 MED ORDER — MIDAZOLAM HCL 2 MG/2ML IJ SOLN
INTRAMUSCULAR | Status: AC
  Filled 2022-09-29: qty 2

## 2022-09-29 MED ORDER — SODIUM CHLORIDE 0.9 % IV SOLN: INTRAVENOUS | Status: DC

## 2022-09-29 MED ORDER — MIDAZOLAM HCL 2 MG/2ML IJ SOLN
INTRAMUSCULAR | Status: AC | PRN
  Administered 2022-09-29 (×2): 1 mg via INTRAVENOUS

## 2022-09-29 MED ORDER — FENTANYL CITRATE (PF) 100 MCG/2ML IJ SOLN
INTRAMUSCULAR | Status: AC | PRN
  Administered 2022-09-29: 50 ug via INTRAVENOUS
  Administered 2022-09-29 (×2): 25 ug via INTRAVENOUS

## 2022-09-29 MED ORDER — LIDOCAINE-EPINEPHRINE 1 %-1:100000 IJ SOLN
INTRAMUSCULAR | Status: AC
  Administered 2022-09-29: 20 mL
  Filled 2022-09-29: qty 1

## 2022-09-29 MED ORDER — HEPARIN SOD (PORK) LOCK FLUSH 100 UNIT/ML IV SOLN
INTRAVENOUS | Status: AC
  Administered 2022-09-29: 500 [IU]
  Filled 2022-09-29: qty 5

## 2022-09-29 MED FILL — Dexamethasone Sodium Phosphate Inj 100 MG/10ML: INTRAMUSCULAR | Qty: 1 | Status: AC

## 2022-09-29 MED FILL — Fosaprepitant Dimeglumine For IV Infusion 150 MG (Base Eq): INTRAVENOUS | Qty: 5 | Status: AC

## 2022-09-29 NOTE — Procedures (Signed)
Vascular and Interventional Radiology Procedure Note  Patient: Holly Vega DOB: 1975/11/01 Medical Record Number: [id number] Note Date/Time: 09/29/22 8:56 AM   Performing Physician: Michaelle Birks, MD Assistant(s): None  Diagnosis: Head and Neck cancer  Procedure: PORT PLACEMENT  Anesthesia: Conscious Sedation Complications: None Estimated Blood Loss: Minimal  Findings:  Successful right-sided port placement, with the tip of the catheter in the proximal right atrium.  Plan: Catheter ready for use.  See detailed procedure note with images in PACS. The patient tolerated the procedure well without incident or complication and was returned to Recovery in stable condition.    Michaelle Birks, MD Vascular and Interventional Radiology Specialists [facility] Radiology   Pager. [place]

## 2022-09-29 NOTE — H&P (Signed)
Chief Complaint: Patient was seen in consultation today for No chief complaint on file.  at the request of Finnegan,Timothy J  Referring Physician(s): Finnegan,Timothy J  Supervising Physician: Michaelle Birks  Patient Status: [facility] - Out-pt  History of Present Illness: Holly Vega is a 47 y.o. female with known cancer of the tongue.  She has poor venous access.  Currently receiving weekly Cisplatin along with daily radiation therapy and is tolerating treatments without significant side effects.  IR now being asked for [facility] placement.  Past Medical History:  Diagnosis Date   Depression    Difficult intubation    Endometriosis    Tongue cancer [facility])     Past Surgical History:  Procedure Laterality Date   ABDOMINAL HYSTERECTOMY  11/20/2018   [facility]   APPENDECTOMY  11/20/2018   [facility]   COLON RESECTION     DIRECT LARYNGOSCOPY N/A 07/21/2022   Procedure: DIRECT MICROLARYNGOSCOPY;  Surgeon: Margaretha Sheffield, MD;  Location: [place];  Service: ENT;  Laterality: N/A;   LAPAROSCOPY  01/25/2018   Procedure: LAPAROSCOPY DIAGNOSTIC;  Surgeon: Will Bonnet, MD;  Location: [facility] OR[facility];  Service: Gynecology;;   TONGUE BIOPSY N/A 07/21/2022   Procedure: TONGUE BASE BIOPSY;  Surgeon: Margaretha Sheffield, MD;  Location: [place];  Service: ENT;  Laterality: N/A;    Allergies: Chlorhexidine gluconate  Medications: Prior to Admission medications   Medication Sig Start Date End Date Taking? Authorizing Provider  HYDROcodone-acetaminophen (NORCO) 5-325 MG tablet Take 1 tablet by mouth every 6 (six) hours as needed for moderate pain. 08/15/22  Yes Lloyd Huger, MD  sucralfate (CARAFATE) 1 g tablet Take 1 tablet (1 g total) by mouth 4 (four) times daily -  with meals and at bedtime. Dissolve tablet in 3 to 4 tablespoons of warm. 09/15/22  Yes Chrystal, Eulas Post, MD  naproxen (NAPROSYN) 500 MG tablet Take 1 tablet (500 mg total) by mouth 2 (two) times daily with a  meal. Patient not taking: Reported on 08/15/2022 07/04/22   Poggi, Eliezer Lofts E, PA-C  ondansetron (ZOFRAN) 8 MG tablet Take 1 tablet (8 mg total) by mouth every 8 (eight) hours as needed for nausea or vomiting. Start on the third day after cisplatin. 08/30/22   Lloyd Huger, MD  prochlorperazine (COMPAZINE) 10 MG tablet Take 1 tablet (10 mg total) by mouth every 6 (six) hours as needed (Nausea or vomiting). 08/30/22   Lloyd Huger, MD     Family History  Problem Relation Age of Onset   Cancer Maternal Uncle     Social History   Socioeconomic History   Marital status: Married    Spouse name: Not on file   Number of children: Not on file   Years of education: Not on file   Highest education level: Not on file  Occupational History   Not on file  Tobacco Use   Smoking status: Former    Packs/day: 0.75    Years: 30.00    Total pack years: 22.50    Types: Cigarettes    Quit date: 07/05/2022    Years since quitting: 0.2   Smokeless tobacco: Never   Tobacco comments:       Vaping Use   Vaping Use: Never used  Substance and Sexual Activity   Alcohol use: No   Drug use: No   Sexual activity: Yes    Birth control/protection: None, Other-see comments    Comment: husband vasectomy  Other Topics Concern   Not on file  Social History Narrative   Not on file   Social Determinants of Health   Financial Resource Strain: Not on file  Food Insecurity: Not on file  Transportation Needs: Not on file  Physical Activity: Inactive (02/09/2018)   Exercise Vital Sign    Days of Exercise per Week: 0 days    Minutes of Exercise per Session: 0 min  Stress: Not on file  Social Connections: Not on file    Review of Systems: A 12 point ROS discussed and pertinent positives are indicated in the HPI above.  All other systems are negative.  Review of Systems  Constitutional:  Positive for appetite change. Negative for chills and fever.  HENT:  Positive for trouble swallowing.    Eyes: Negative.   Respiratory: Negative.    Cardiovascular: Negative.   Gastrointestinal:  Positive for constipation. Negative for diarrhea, nausea and vomiting.  Genitourinary: Negative.   Musculoskeletal: Negative.   Skin:  Positive for rash.  Allergic/Immunologic: Negative.   Neurological: Negative.   Hematological: Negative.   Psychiatric/Behavioral: Negative.      Vital Signs: BP (!) 103/91   Pulse 65   Temp 98.4 F (36.9 C) (Oral)   Resp 12   Ht '5\' 7"'$  (1.702 m)   Wt 144 lb (65.3 kg)   LMP 01/28/2018 (Exact Date)   SpO2 99%   BMI 22.55 kg/m     Physical Exam Vitals reviewed.  Constitutional:      General: She is not in acute distress.    Appearance: She is not toxic-appearing.  HENT:     Head: Normocephalic and atraumatic.     Mouth/Throat:     Mouth: Mucous membranes are dry.     Comments: Opens mouth minimally Cardiovascular:     Rate and Rhythm: Normal rate and regular rhythm.     Pulses: Normal pulses.  Pulmonary:     Effort: Pulmonary effort is normal.     Breath sounds: Normal breath sounds.  Abdominal:     General: Abdomen is flat.     Palpations: Abdomen is soft.  Skin:    General: Skin is warm and dry.     Findings: Rash present.     Comments: Small patch of redness on left lower leg  Neurological:     General: No focal deficit present.     Mental Status: She is alert and oriented to person, place, and time.  Psychiatric:        Mood and Affect: Mood normal.        Behavior: Behavior normal.     Imaging: No results found.  Labs:  CBC: Recent Labs    08/30/22 0800 09/09/22 0807 09/16/22 0810 09/23/22 0816  WBC 5.6 6.8 5.4 5.3  HGB 15.6* 14.2 13.3 13.5  HCT 45.9 41.6 38.7 39.3  PLT 264 255 216 232    COAGS: No results for input(s): "INR", "APTT" in the last 8760 hours.  BMP: Recent Labs    08/30/22 0800 09/09/22 0807 09/16/22 0810 09/23/22 0816  NA 141 136 138 137  K 3.6 4.3 4.5 3.8  CL 105 100 103 104  CO2 '25 27  28 24  '$ GLUCOSE 100* 92 94 100*  BUN '7 8 7 9  '$ CALCIUM 9.7 9.6 9.2 9.3  CREATININE 0.57 0.61 0.72 0.70  GFRNONAA >60 >60 >60 >60    LIVER FUNCTION TESTS: Recent Labs    07/04/22 1650  BILITOT 0.6  AST 18  ALT 14  ALKPHOS 82  PROT 8.2*  ALBUMIN 4.9    TUMOR MARKERS: No results for input(s): "AFPTM", "CEA", "CA199", "CHROMGRNA" in the last 8760 hours.  Assessment and Plan:  Cancer of the tongue -undergoing chemotherapy, however, has difficult vascular access -Oncology requesting port placement -Pt is appropriately NPO and not on any thinners.  Mom accompanies her and is available for taking home and after care. -Will plan to proceed with Port-a-cath placement and anticipated discharge this morning.  Risks and benefits of image guided port-a-catheter placement was discussed with the patient including, but not limited to bleeding, infection, pneumothorax, or fibrin sheath development and need for additional procedures.  All of the patient's questions were answered, patient is agreeable to proceed. Consent signed and in chart.   Thank you for this interesting consult.  I greatly enjoyed meeting Holly Vega and look forward to participating in their care.  A copy of this report was sent to the requesting provider on this date.  Electronically Signed: Pasty Spillers, PA 09/29/2022, 8:26 AM   I spent a total of 30 minutes in face to face in clinical consultation, greater than 50% of which was counseling/coordinating care for port placement

## 2022-09-30 ENCOUNTER — Inpatient Hospital Stay: Payer: BC Managed Care – PPO

## 2022-09-30 ENCOUNTER — Encounter: Payer: Self-pay | Admitting: Oncology

## 2022-09-30 ENCOUNTER — Inpatient Hospital Stay (HOSPITAL_BASED_OUTPATIENT_CLINIC_OR_DEPARTMENT_OTHER): Payer: BC Managed Care – PPO | Admitting: Oncology

## 2022-09-30 ENCOUNTER — Ambulatory Visit
Admission: RE | Admit: 2022-09-30 | Discharge: 2022-09-30 | Disposition: A | Discharge status: Home / Self Care | Payer: BC Managed Care – PPO | Source: Ambulatory Visit | Attending: Radiation Oncology | Admitting: Radiation Oncology

## 2022-09-30 ENCOUNTER — Other Ambulatory Visit: Payer: Self-pay

## 2022-09-30 DIAGNOSIS — C029 Malignant neoplasm of tongue, unspecified: Secondary | ICD-10-CM

## 2022-09-30 DIAGNOSIS — Z510 Encounter for antineoplastic radiation therapy: Secondary | ICD-10-CM | POA: Diagnosis not present

## 2022-09-30 DIAGNOSIS — C01 Malignant neoplasm of base of tongue: Secondary | ICD-10-CM | POA: Insufficient documentation

## 2022-09-30 DIAGNOSIS — Z79899 Other long term (current) drug therapy: Secondary | ICD-10-CM | POA: Insufficient documentation

## 2022-09-30 DIAGNOSIS — Z5111 Encounter for antineoplastic chemotherapy: Secondary | ICD-10-CM | POA: Insufficient documentation

## 2022-09-30 LAB — RAD ONC ARIA SESSION SUMMARY
Course Elapsed Days: 25
Plan Fractions Treated to Date: 17
Plan Prescribed Dose Per Fraction: 2 Gy
Plan Total Fractions Prescribed: 35
Plan Total Prescribed Dose: 70 Gy
Reference Point Dosage Given to Date: 34 Gy
Reference Point Session Dosage Given: 2 Gy
Session Number: 17

## 2022-09-30 LAB — CBC WITH DIFFERENTIAL/PLATELET
Abs Immature Granulocytes: 0 10*3/uL (ref 0.00–0.07)
Basophils Absolute: 0 10*3/uL (ref 0.0–0.1)
Basophils Relative: 0 %
Eosinophils Absolute: 0.1 10*3/uL (ref 0.0–0.5)
Eosinophils Relative: 2 %
HCT: 38.3 % (ref 36.0–46.0)
Hemoglobin: 13.1 g/dL (ref 12.0–15.0)
Immature Granulocytes: 0 %
Lymphocytes Relative: 13 %
Lymphs Abs: 0.6 10*3/uL — ABNORMAL LOW (ref 0.7–4.0)
MCH: 30.5 pg (ref 26.0–34.0)
MCHC: 34.2 g/dL (ref 30.0–36.0)
MCV: 89.3 fL (ref 80.0–100.0)
Monocytes Absolute: 0.4 10*3/uL (ref 0.1–1.0)
Monocytes Relative: 8 %
Neutro Abs: 3.5 10*3/uL (ref 1.7–7.7)
Neutrophils Relative %: 77 %
Platelets: 157 10*3/uL (ref 150–400)
RBC: 4.29 MIL/uL (ref 3.87–5.11)
RDW: 13.4 % (ref 11.5–15.5)
WBC: 4.5 10*3/uL (ref 4.0–10.5)
nRBC: 0 % (ref 0.0–0.2)

## 2022-09-30 LAB — BASIC METABOLIC PANEL
Anion gap: 9 (ref 5–15)
BUN: 9 mg/dL (ref 6–20)
CO2: 26 mmol/L (ref 22–32)
Calcium: 9.4 mg/dL (ref 8.9–10.3)
Chloride: 101 mmol/L (ref 98–111)
Creatinine, Ser: 0.67 mg/dL (ref 0.44–1.00)
GFR, Estimated: >60 mL/min (ref >60)
Glucose, Bld: 112 mg/dL — ABNORMAL HIGH (ref 70–99)
Potassium: 4.1 mmol/L (ref 3.5–5.1)
Sodium: 136 mmol/L (ref 135–145)

## 2022-09-30 LAB — MAGNESIUM: Magnesium: 2.2 mg/dL (ref 1.7–2.4)

## 2022-09-30 MED ORDER — PALONOSETRON HCL INJECTION 0.25 MG/5ML
0.2500 mg | Freq: Once | INTRAVENOUS | Status: AC
  Administered 2022-09-30: 0.25 mg via INTRAVENOUS
  Filled 2022-09-30: qty 5

## 2022-09-30 MED ORDER — POTASSIUM CHLORIDE IN NACL 20-0.9 MEQ/L-% IV SOLN
Freq: Once | INTRAVENOUS | Status: AC
  Filled 2022-09-30: qty 1000

## 2022-09-30 MED ORDER — SODIUM CHLORIDE 0.9 % IV SOLN
40.0000 mg/m2 | Freq: Once | INTRAVENOUS | Status: AC
  Administered 2022-09-30: 73 mg via INTRAVENOUS
  Filled 2022-09-30: qty 73

## 2022-09-30 MED ORDER — LIDOCAINE-PRILOCAINE 2.5-2.5 % EX CREA: 1.0000 | TOPICAL_CREAM | CUTANEOUS | 0 refills | Status: AC | PRN

## 2022-09-30 MED ORDER — HEPARIN SOD (PORK) LOCK FLUSH 100 UNIT/ML IV SOLN
500.0000 [IU] | Freq: Once | INTRAVENOUS | Status: AC
  Administered 2022-09-30: 500 [IU] via INTRAVENOUS
  Filled 2022-09-30: qty 5

## 2022-09-30 MED ORDER — SODIUM CHLORIDE 0.9 % IV SOLN
Freq: Once | INTRAVENOUS | Status: AC
  Filled 2022-09-30: qty 250

## 2022-09-30 MED ORDER — TRAMADOL HCL 50 MG PO TABS: 50.0000 mg | ORAL_TABLET | Freq: Four times a day (QID) | ORAL | 0 refills | Status: DC | PRN

## 2022-09-30 MED ORDER — MAGNESIUM SULFATE 2 GM/50ML IV SOLN
2.0000 g | Freq: Once | INTRAVENOUS | Status: AC
  Administered 2022-09-30: 2 g via INTRAVENOUS
  Filled 2022-09-30: qty 50

## 2022-09-30 MED ORDER — SODIUM CHLORIDE 0.9 % IV SOLN
10.0000 mg | Freq: Once | INTRAVENOUS | Status: AC
  Administered 2022-09-30: 10 mg via INTRAVENOUS
  Filled 2022-09-30: qty 10

## 2022-09-30 MED ORDER — SODIUM CHLORIDE 0.9% FLUSH
10.0000 mL | Freq: Once | INTRAVENOUS | Status: AC
  Administered 2022-09-30: 10 mL via INTRAVENOUS
  Filled 2022-09-30: qty 10

## 2022-09-30 MED ORDER — HEPARIN SOD (PORK) LOCK FLUSH 100 UNIT/ML IV SOLN
500.0000 [IU] | Freq: Once | INTRAVENOUS | Status: DC | PRN
  Filled 2022-09-30: qty 5

## 2022-09-30 MED ORDER — SODIUM CHLORIDE 0.9 % IV SOLN
150.0000 mg | Freq: Once | INTRAVENOUS | Status: AC
  Administered 2022-09-30: 150 mg via INTRAVENOUS
  Filled 2022-09-30: qty 150

## 2022-09-30 NOTE — Patient Instructions (Signed)
[facility] CANCER [facility] AT [place]  Discharge Instructions: Thank you for choosing [facility] to provide your oncology and hematology care.  If you have a lab appointment with the [facility], please go directly to the [facility] and check in at the registration area.  Wear comfortable clothing and clothing appropriate for easy access to any Portacath or PICC line.   We strive to give you quality time with your provider. You may need to reschedule your appointment if you arrive late (15 or more minutes).  Arriving late affects you and other patients whose appointments are after yours.  Also, if you miss three or more appointments without notifying the office, you may be dismissed from the clinic at the provider's discretion.      For prescription refill requests, have your pharmacy contact our office and allow 72 hours for refills to be completed.    Today you received the following chemotherapy and/or immunotherapy agents CISPLATIN      To help prevent nausea and vomiting after your treatment, we encourage you to take your nausea medication as directed.  BELOW ARE SYMPTOMS THAT SHOULD BE REPORTED IMMEDIATELY: *FEVER GREATER THAN 100.4 F (38 C) OR HIGHER *CHILLS OR SWEATING *NAUSEA AND VOMITING THAT IS NOT CONTROLLED WITH YOUR NAUSEA MEDICATION *UNUSUAL SHORTNESS OF BREATH *UNUSUAL BRUISING OR BLEEDING *URINARY PROBLEMS (pain or burning when urinating, or frequent urination) *BOWEL PROBLEMS (unusual diarrhea, constipation, pain near the anus) TENDERNESS IN MOUTH AND THROAT WITH OR WITHOUT PRESENCE OF ULCERS (sore throat, sores in mouth, or a toothache) UNUSUAL RASH, SWELLING OR PAIN  UNUSUAL VAGINAL DISCHARGE OR ITCHING   Items with * indicate a potential emergency and should be followed up as soon as possible or go to the Emergency Department if any problems should occur.  Please show the CHEMOTHERAPY ALERT CARD or IMMUNOTHERAPY ALERT CARD at check-in to  the Emergency Department and triage nurse.  Should you have questions after your visit or need to cancel or reschedule your appointment, please contact [facility] CANCER [place] AT [place]  [phone] and follow the prompts.  Office hours are 8:00 a.m. to 4:30 p.m. Monday - Friday. Please note that voicemails left after 4:00 p.m. may not be returned until the following business day.  We are closed weekends and major holidays. You have access to a nurse at all times for urgent questions. Please call the main number to the clinic [phone] and follow the prompts.  For any non-urgent questions, you may also contact your provider using MyChart. We now offer e-Visits for anyone 38 and older to request care online for non-urgent symptoms. For details visit [email or web address].   Also download the MyChart app! Go to the app store, search "MyChart", open the app, select [facility], and log in with your MyChart username and password.   Cisplatin Injection What is this medication? CISPLATIN (SIS pla tin) treats some types of cancer. It works by slowing down the growth of cancer cells. This medicine may be used for other purposes; ask your health care provider or pharmacist if you have questions. COMMON BRAND NAME(S): Platinol, Platinol -AQ What should I tell my care team before I take this medication? They need to know if you have any of these conditions: Eye disease, vision problems Hearing problems Kidney disease Low blood counts, such as low white cells, platelets, or red blood cells Tingling of the fingers or toes, or other nerve disorder An unusual or allergic reaction to cisplatin, carboplatin, oxaliplatin, other medications,  foods, dyes, or preservatives If you or your partner are pregnant or trying to get pregnant Breast-feeding How should I use this medication? This medication is injected into a vein. It is given by your care team in a hospital or clinic setting. Talk to  your care team about the use of this medication in children. Special care may be needed. Overdosage: If you think you have taken too much of this medicine contact a poison control center or emergency room at once. NOTE: This medicine is only for you. Do not share this medicine with others. What if I miss a dose? Keep appointments for follow-up doses. It is important not to miss your dose. Call your care team if you are unable to keep an appointment. What may interact with this medication? Do not take this medication with any of the following: Live virus vaccines This medication may also interact with the following: Certain antibiotics, such as amikacin, gentamicin, neomycin, polymyxin B, streptomycin, tobramycin, vancomycin Foscarnet This list may not describe all possible interactions. Give your health care provider a list of all the medicines, herbs, non-prescription drugs, or dietary supplements you use. Also tell them if you smoke, drink alcohol, or use illegal drugs. Some items may interact with your medicine. What should I watch for while using this medication? Your condition will be monitored carefully while you are receiving this medication. You may need blood work done while taking this medication. This medication may make you feel generally unwell. This is not uncommon, as chemotherapy can affect healthy cells as well as cancer cells. Report any side effects. Continue your course of treatment even though you feel ill unless your care team tells you to stop. This medication may increase your risk of getting an infection. Call your care team for advice if you get a fever, chills, sore throat, or other symptoms of a cold or flu. Do not treat yourself. Try to avoid being around people who are sick. Avoid taking medications that contain aspirin, acetaminophen, ibuprofen, naproxen, or ketoprofen unless instructed by your care team. These medications may hide a fever. This medication may increase  your risk to bruise or bleed. Call your care team if you notice any unusual bleeding. Be careful brushing or flossing your teeth or using a toothpick because you may get an infection or bleed more easily. If you have any dental work done, tell your dentist you are receiving this medication. Drink fluids as directed while you are taking this medication. This will help protect your kidneys. Call your care team if you get diarrhea. Do not treat yourself. Talk to your care team if you or your partner wish to become pregnant or think you might be pregnant. This medication can cause serious birth defects if taken during pregnancy and for 14 months after the last dose. A negative pregnancy test is required before starting this medication. A reliable form of contraception is recommended while taking this medication and for 14 months after the last dose. Talk to your care team about effective forms of contraception. Do not father a child while taking this medication and for 11 months after the last dose. Use a condom during sex during this time period. Do not breast-feed while taking this medication. This medication may cause infertility. Talk to your care team if you are concerned about your fertility. What side effects may I notice from receiving this medication? Side effects that you should report to your care team as soon as possible: Allergic reactions--skin rash, itching,  hives, swelling of the face, lips, tongue, or throat Eye pain, change in vision, vision loss Hearing loss, ringing in ears Infection--fever, chills, cough, sore throat, wounds that don't heal, pain or trouble when passing urine, general feeling of discomfort or being unwell Kidney injury--decrease in the amount of urine, swelling of the ankles, hands, or feet Low red blood cell level--unusual weakness or fatigue, dizziness, headache, trouble breathing Painful swelling, warmth, or redness of the skin, blisters or sores at the infusion  site Pain, tingling, or numbness in the hands or feet Unusual bruising or bleeding Side effects that usually do not require medical attention (report to your care team if they continue or are bothersome): Hair loss Nausea Vomiting This list may not describe all possible side effects. Call your doctor for medical advice about side effects. You may report side effects to FDA at 1-800-FDA-1088. Where should I keep my medication? This medication is given in a hospital or clinic. It will not be stored at home. NOTE: This sheet is a summary. It may not cover all possible information. If you have questions about this medicine, talk to your doctor, pharmacist, or health care provider.  2023 Elsevier/Gold Standard (2022-01-07 00:00:00)

## 2022-09-30 NOTE — Progress Notes (Signed)
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  Past medical history of endometriosis, depression.  Patient receiving concurrent radiation and chemotherapy.    Met with patient during infusion.  Patient reports taste alterations more of bitter, metallic taste.  Has thick saliva.  Has been doing mostly liquids (water, chocolate milk, coffee, boost (250 calories 1 a day).  Able to eat some popcorn and take a few bites of other foods (tomato soup).  Has problems opening her mouth but has been doing exercises that ENT gave her.    Says that she does not want a feeding tube.   Medications: reviewed  Labs: reviewed  Anthropometrics:   Height: 67 inches Weight: 143 lb 1.6 oz today 153 lb on 07/04/22 BMI: 22  6% weight loss in the last 3 months   Estimated Energy Needs  Kcals: 4917-9150 Protein: 98-114 g Fluid: 1950-2275 ml  NUTRITION DIAGNOSIS: Inadequate oral intake related to cancer related treatment side effects as evidenced by 6% weight loss in the last 3 months and poor po intake   INTERVENTION:  Recommend SLP evaluation.  Message sent to MD Discussed strategies for taste change.  Handout provided Discussed ways to add calories and protein in diet Samples of ensure complete and boost [facility] given to patient to try.  Coupons also given.  If drinking 350 calorie shake,can drink 4-6 per day if eating little solid foods.  Contact information provided    MONITORING, EVALUATION, GOAL: weight trends, intake   NEXT VISIT: Friday, Jan 12 during infusion  Phelicia Dantes B. Zenia Resides, [place], [place] Registered Dietitian [phone]

## 2022-09-30 NOTE — Addendum Note (Signed)
Addended by: Jennet Maduro B on: 09/30/2022 11:37 AM   Modules accepted: Orders

## 2022-09-30 NOTE — Progress Notes (Signed)
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  P16 positive.  INTERVAL HISTORY: Patient returns to clinic today for further evaluation and consideration of cycle 5 of weekly cisplatin.  She has now had port placement.  She is tolerating her treatments well without significant facts.  She has mild tenderness at the site of her port insertion, but otherwise feels well.  She does not complain of pain today.  She has no neurologic complaints.  She denies any recent fevers or illnesses.  She has a good appetite and denies weight loss.  She has no chest pain, shortness of breath, cough, or hemoptysis.  She denies any nausea, vomiting, constipation, or diarrhea.  She has no urinary complaints.  Patient offers no further specific complaints today.  REVIEW OF SYSTEMS:   Review of Systems  Constitutional: Negative.  Negative for fever, malaise/fatigue and weight loss.  HENT:  Negative for sore throat.   Respiratory: Negative.  Negative for cough, hemoptysis and shortness of breath.   Cardiovascular: Negative.  Negative for chest pain and leg swelling.  Gastrointestinal: Negative.  Negative for abdominal pain.  Genitourinary: Negative.  Negative for dysuria.  Musculoskeletal:  Negative for neck pain.  Skin: Negative.  Negative for rash.  Neurological: Negative.  Negative for dizziness, focal weakness, weakness and headaches.  Psychiatric/Behavioral: Negative.  The patient is not nervous/anxious.     As per HPI. Otherwise, a complete review of systems is negative.  PAST MEDICAL HISTORY: Past Medical History:  Diagnosis Date   Depression    Difficult intubation    Endometriosis    Tongue  cancer [facility])     PAST SURGICAL HISTORY: Past Surgical History:  Procedure Laterality Date   ABDOMINAL HYSTERECTOMY  11/20/2018   [facility]   APPENDECTOMY  11/20/2018   [facility]   COLON RESECTION     DIRECT LARYNGOSCOPY N/A 07/21/2022   Procedure: DIRECT MICROLARYNGOSCOPY;  Surgeon: Margaretha Sheffield, MD;  Location: [facility];  Service: ENT;  Laterality: N/A;   IR IMAGING GUIDED PORT INSERTION  09/29/2022   LAPAROSCOPY  01/25/2018   Procedure: LAPAROSCOPY DIAGNOSTIC;  Surgeon: Will Bonnet, MD;  Location: [facility] ORS;  Service: Gynecology;;   TONGUE BIOPSY N/A 07/21/2022   Procedure: TONGUE BASE BIOPSY;  Surgeon: Margaretha Sheffield, MD;  Location: [facility];  Service: ENT;  Laterality: N/A;    FAMILY HISTORY: Family History  Problem Relation Age of Onset   Cancer Maternal Uncle     ADVANCED DIRECTIVES (Y/N):  N  HEALTH MAINTENANCE: Social History   Tobacco Use   Smoking status: Former    Packs/day: 0.75    Years: 30.00    Total pack years: 22.50    Types: Cigarettes    Quit date: 07/05/2022    Years since quitting: 0.2   Smokeless tobacco: Never   Tobacco comments:       Vaping Use   Vaping Use: Never used  Substance Use Topics   Alcohol use: No   Drug use: No     Colonoscopy:  PAP:  Bone density:  Lipid panel:  Allergies  Allergen Reactions   Chlorhexidine Gluconate Rash    Current Outpatient Medications  Medication Sig Dispense Refill   HYDROcodone-acetaminophen (NORCO) 5-325 MG  tablet Take 1 tablet by mouth every 6 (six) hours as needed for moderate pain. 60 tablet 0   lidocaine-prilocaine (EMLA) cream Apply 1 Application topically as needed. 30 g 0   ondansetron (ZOFRAN) 8 MG tablet Take 1 tablet (8 mg total) by mouth every 8 (eight) hours as needed for nausea or vomiting. Start on the third day after cisplatin. 60 tablet 1   prochlorperazine (COMPAZINE) 10 MG tablet Take 1 tablet (10 mg total) by mouth every 6 (six) hours as needed (Nausea or  vomiting). 60 tablet 1   sucralfate (CARAFATE) 1 g tablet Take 1 tablet (1 g total) by mouth 4 (four) times daily -  with meals and at bedtime. Dissolve tablet in 3 to 4 tablespoons of warm. 90 tablet 1   traMADol (ULTRAM) 50 MG tablet Take 1 tablet (50 mg total) by mouth every 6 (six) hours as needed. 30 tablet 0   naproxen (NAPROSYN) 500 MG tablet Take 1 tablet (500 mg total) by mouth 2 (two) times daily with a meal. (Patient not taking: Reported on 08/15/2022) 10 tablet 2   No current facility-administered medications for this visit.   Facility-Administered Medications Ordered in Other Visits  Medication Dose Route Frequency Provider Last Rate Last Admin   0.9 %  sodium chloride infusion   Intravenous Once Grayland Ormond, Kathlene November, MD       0.9 % NaCl with KCl 20 mEq/ L  infusion   Intravenous Once Grayland Ormond, Kathlene November, MD       CISplatin (PLATINOL) 73 mg in sodium chloride 0.9 % 250 mL chemo infusion  40 mg/m2 (Treatment Plan Recorded) Intravenous Once Lloyd Huger, MD       dexamethasone (DECADRON) 10 mg in sodium chloride 0.9 % 50 mL IVPB  10 mg Intravenous Once Lloyd Huger, MD       fosaprepitant (EMEND) 150 mg in sodium chloride 0.9 % 145 mL IVPB  150 mg Intravenous Once Lloyd Huger, MD       heparin lock flush 100 unit/mL  500 Units Intravenous Once Lloyd Huger, MD       heparin lock flush 100 unit/mL  500 Units Intracatheter Once PRN Lloyd Huger, MD       magnesium sulfate IVPB 2 g 50 mL  2 g Intravenous Once Lloyd Huger, MD       palonosetron (ALOXI) injection 0.25 mg  0.25 mg Intravenous Once Lloyd Huger, MD        OBJECTIVE: Vitals:   09/30/22 0838  BP: 111/79  Pulse: 90  Resp: 16  Temp: 97.7 F (36.5 C)  SpO2: 98%     Body mass index is 22.41 kg/m.    ECOG FS:0 - Asymptomatic  General: Well-developed, well-nourished, no acute distress. Eyes: Pink conjunctiva, anicteric sclera. HEENT: Normocephalic, moist mucous  membranes.  No palpable lymphadenopathy. Lungs: No audible wheezing or coughing. Heart: Regular rate and rhythm. Abdomen: Soft, nontender, no obvious distention. Musculoskeletal: No edema, cyanosis, or clubbing. Neuro: Alert, answering all questions appropriately. Cranial nerves grossly intact. Skin: No rashes or petechiae noted. Psych: Normal affect.  LAB RESULTS:  Lab Results  Component Value Date   NA 136 09/30/2022   K 4.1 09/30/2022   CL 101 09/30/2022   CO2 26 09/30/2022   GLUCOSE 112 (H) 09/30/2022   BUN 9 09/30/2022   CREATININE 0.67 09/30/2022   CALCIUM 9.4 09/30/2022   PROT 8.2 (H) 07/04/2022   ALBUMIN 4.9 07/04/2022   AST  18 07/04/2022   ALT 14 07/04/2022   ALKPHOS 82 07/04/2022   BILITOT 0.6 07/04/2022   GFRNONAA >60 09/30/2022   GFRAA >60 01/15/2018    Lab Results  Component Value Date   WBC 4.5 09/30/2022   NEUTROABS 3.5 09/30/2022   HGB 13.1 09/30/2022   HCT 38.3 09/30/2022   MCV 89.3 09/30/2022   PLT 157 09/30/2022     STUDIES: IR IMAGING GUIDED PORT INSERTION  Result Date: 09/29/2022 INDICATION: Head and neck cancer. EXAM: IMPLANTED PORT A CATH PLACEMENT WITH ULTRASOUND AND FLUOROSCOPIC GUIDANCE MEDICATIONS: None ANESTHESIA/SEDATION: Moderate (conscious) sedation was employed during this procedure. A total of Versed 2 mg and Fentanyl 100 mcg was administered intravenously. Moderate Sedation Time: 15 minutes. The patient's level of consciousness and vital signs were monitored continuously by radiology nursing throughout the procedure under my direct supervision. FLUOROSCOPY TIME:  Fluoroscopic dose; 2.5 mGy COMPLICATIONS: None immediate. PROCEDURE: The procedure, risks, benefits, and alternatives were explained to the patient. Questions regarding the procedure were encouraged and answered. The patient understands and consents to the procedure. The RIGHT neck and chest were prepped with chlorhexidine in a sterile fashion, and a sterile drape was applied  covering the operative field. Maximum barrier sterile technique with sterile gowns and gloves were used for the procedure. A timeout was performed prior to the initiation of the procedure. Local anesthesia was provided with 1% lidocaine with epinephrine. After creating a small venotomy incision, a micropuncture kit was utilized to access the internal jugular vein under direct, real-time ultrasound guidance. Ultrasound image documentation was performed. The microwire was kinked to measure appropriate catheter length. A subcutaneous port pocket was then created along the upper chest wall utilizing a combination of sharp and blunt dissection. The pocket was irrigated with sterile saline. A single lumen Non-ISP power injectable port was chosen for placement. The 8 Fr catheter was tunneled from the port pocket site to the venotomy incision. The port was placed in the pocket. The external catheter was trimmed to appropriate length. At the venotomy, an 8 Fr peel-away sheath was placed over a guidewire under fluoroscopic guidance. The catheter was then placed through the sheath and the sheath was removed. Final catheter positioning was confirmed and documented with a fluoroscopic spot radiograph. The port was accessed with a Huber needle, aspirated and flushed with heparinized saline. The port pocket incision was closed with interrupted 3-0 Vicryl suture then Dermabond was applied, including at the venotomy incision. Dressings were placed. The patient tolerated the procedure well without immediate post procedural complication. IMPRESSION: Successful placement of a RIGHT internal jugular approach power injectable Port-A-Cath. The tip of the catheter is positioned within the proximal RIGHT atrium. The catheter is ready for immediate use. Michaelle Birks, MD [facility] Radiology Electronically Signed   By: Michaelle Birks M.D.   On: 09/29/2022 09:57    ASSESSMENT: Stage III squamous  cell carcinoma of left base of tongue.  P16 positive.  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  PET scan results from August 10, 2022 reviewed independently with hypermetabolic left base of tongue mass and minimally metabolic lymph node unclear if this is a metastasis or reactive.  Patient will benefit from concurrent chemotherapy using weekly cisplatin 40 mg/m2 with daily XRT.  Patient has now had port placement.  Proceed with cycle 5 of weekly cisplatin today.  Continue daily XRT completing on October 26, 2022.  Return to clinic in 1 week for further evaluation and consideration of cycle 6.     Pain: Patient no longer has neck pain, but admits to tenderness at port site and was given a prescription for tramadol.  She also has Norco as needed for nighttime use. IV access: Patient now has a port. Dysphagia: Mild.  Continue Carafate as prescribed.   Patient expressed understanding and was in agreement with this plan. She also understands that She can call clinic at any time with any questions, concerns, or complaints.    Cancer Staging  Tongue cancer [facility]) Staging form: Oral Cavity, AJCC 8th Edition - Clinical stage from 08/05/2022: Stage III (cT3, cN1, cM0) - Signed by Lloyd Huger, MD on 08/05/2022 Stage prefix: Initial diagnosis   Lloyd Huger, MD   09/30/2022 9:23 AM

## 2022-10-03 ENCOUNTER — Ambulatory Visit
Admission: RE | Admit: 2022-10-03 | Discharge: 2022-10-03 | Disposition: A | Discharge status: Home / Self Care | Payer: BC Managed Care – PPO | Source: Ambulatory Visit | Attending: Radiation Oncology | Admitting: Radiation Oncology

## 2022-10-03 ENCOUNTER — Other Ambulatory Visit: Payer: Self-pay

## 2022-10-03 DIAGNOSIS — Z510 Encounter for antineoplastic radiation therapy: Secondary | ICD-10-CM | POA: Diagnosis not present

## 2022-10-03 LAB — RAD ONC ARIA SESSION SUMMARY
Course Elapsed Days: 28
Plan Fractions Treated to Date: 18
Plan Prescribed Dose Per Fraction: 2 Gy
Plan Total Fractions Prescribed: 35
Plan Total Prescribed Dose: 70 Gy
Reference Point Dosage Given to Date: 36 Gy
Reference Point Session Dosage Given: 2 Gy
Session Number: 18

## 2022-10-04 ENCOUNTER — Other Ambulatory Visit: Payer: Self-pay

## 2022-10-04 ENCOUNTER — Ambulatory Visit
Admission: RE | Admit: 2022-10-04 | Discharge: 2022-10-04 | Disposition: A | Discharge status: Home / Self Care | Payer: BC Managed Care – PPO | Source: Ambulatory Visit | Attending: Radiation Oncology | Admitting: Radiation Oncology

## 2022-10-04 DIAGNOSIS — Z510 Encounter for antineoplastic radiation therapy: Secondary | ICD-10-CM | POA: Diagnosis not present

## 2022-10-04 LAB — RAD ONC ARIA SESSION SUMMARY
Course Elapsed Days: 29
Plan Fractions Treated to Date: 19
Plan Prescribed Dose Per Fraction: 2 Gy
Plan Total Fractions Prescribed: 35
Plan Total Prescribed Dose: 70 Gy
Reference Point Dosage Given to Date: 38 Gy
Reference Point Session Dosage Given: 2 Gy
Session Number: 19

## 2022-10-05 ENCOUNTER — Ambulatory Visit: Payer: BC Managed Care – PPO

## 2022-10-05 ENCOUNTER — Ambulatory Visit: Payer: BC Managed Care – PPO | Admitting: Oncology

## 2022-10-05 ENCOUNTER — Other Ambulatory Visit: Payer: BC Managed Care – PPO

## 2022-10-06 ENCOUNTER — Other Ambulatory Visit: Payer: Self-pay

## 2022-10-06 ENCOUNTER — Ambulatory Visit
Admission: RE | Admit: 2022-10-06 | Discharge: 2022-10-06 | Disposition: A | Discharge status: Home / Self Care | Payer: BC Managed Care – PPO | Source: Ambulatory Visit | Attending: Radiation Oncology | Admitting: Radiation Oncology

## 2022-10-06 DIAGNOSIS — Z510 Encounter for antineoplastic radiation therapy: Secondary | ICD-10-CM | POA: Diagnosis not present

## 2022-10-06 LAB — RAD ONC ARIA SESSION SUMMARY
Course Elapsed Days: 31
Plan Fractions Treated to Date: 20
Plan Prescribed Dose Per Fraction: 2 Gy
Plan Total Fractions Prescribed: 35
Plan Total Prescribed Dose: 70 Gy
Reference Point Dosage Given to Date: 40 Gy
Reference Point Session Dosage Given: 2 Gy
Session Number: 20

## 2022-10-06 MED FILL — Fosaprepitant Dimeglumine For IV Infusion 150 MG (Base Eq): INTRAVENOUS | Qty: 5 | Status: AC

## 2022-10-06 MED FILL — Dexamethasone Sodium Phosphate Inj 100 MG/10ML: INTRAMUSCULAR | Qty: 1 | Status: AC

## 2022-10-07 ENCOUNTER — Inpatient Hospital Stay: Payer: BC Managed Care – PPO

## 2022-10-07 ENCOUNTER — Inpatient Hospital Stay (HOSPITAL_BASED_OUTPATIENT_CLINIC_OR_DEPARTMENT_OTHER): Payer: BC Managed Care – PPO | Admitting: Oncology

## 2022-10-07 ENCOUNTER — Other Ambulatory Visit: Payer: Self-pay

## 2022-10-07 ENCOUNTER — Ambulatory Visit
Admission: RE | Admit: 2022-10-07 | Discharge: 2022-10-07 | Disposition: A | Discharge status: Home / Self Care | Payer: BC Managed Care – PPO | Source: Ambulatory Visit | Attending: Radiation Oncology | Admitting: Radiation Oncology

## 2022-10-07 ENCOUNTER — Encounter: Payer: Self-pay | Admitting: Oncology

## 2022-10-07 VITALS — BP 108/67 | HR 77 | Temp 98.3°F | Resp 16 | Ht 67.0 in | Wt 142.0 lb

## 2022-10-07 DIAGNOSIS — C029 Malignant neoplasm of tongue, unspecified: Secondary | ICD-10-CM | POA: Diagnosis not present

## 2022-10-07 DIAGNOSIS — Z510 Encounter for antineoplastic radiation therapy: Secondary | ICD-10-CM | POA: Diagnosis not present

## 2022-10-07 LAB — CBC WITH DIFFERENTIAL/PLATELET
Abs Immature Granulocytes: 0.01 10*3/uL (ref 0.00–0.07)
Basophils Absolute: 0 10*3/uL (ref 0.0–0.1)
Basophils Relative: 1 %
Eosinophils Absolute: 0 10*3/uL (ref 0.0–0.5)
Eosinophils Relative: 1 %
HCT: 36.3 % (ref 36.0–46.0)
Hemoglobin: 12.7 g/dL (ref 12.0–15.0)
Immature Granulocytes: 0 %
Lymphocytes Relative: 14 %
Lymphs Abs: 0.5 10*3/uL — ABNORMAL LOW (ref 0.7–4.0)
MCH: 31.2 pg (ref 26.0–34.0)
MCHC: 35 g/dL (ref 30.0–36.0)
MCV: 89.2 fL (ref 80.0–100.0)
Monocytes Absolute: 0.3 10*3/uL (ref 0.1–1.0)
Monocytes Relative: 9 %
Neutro Abs: 2.8 10*3/uL (ref 1.7–7.7)
Neutrophils Relative %: 75 %
Platelets: 111 10*3/uL — ABNORMAL LOW (ref 150–400)
RBC: 4.07 MIL/uL (ref 3.87–5.11)
RDW: 13.4 % (ref 11.5–15.5)
WBC: 3.7 10*3/uL — ABNORMAL LOW (ref 4.0–10.5)
nRBC: 0 % (ref 0.0–0.2)

## 2022-10-07 LAB — BASIC METABOLIC PANEL
Anion gap: 9 (ref 5–15)
BUN: 10 mg/dL (ref 6–20)
CO2: 27 mmol/L (ref 22–32)
Calcium: 9.4 mg/dL (ref 8.9–10.3)
Chloride: 101 mmol/L (ref 98–111)
Creatinine, Ser: 0.72 mg/dL (ref 0.44–1.00)
GFR, Estimated: >60 mL/min (ref >60)
Glucose, Bld: 93 mg/dL (ref 70–99)
Potassium: 4 mmol/L (ref 3.5–5.1)
Sodium: 137 mmol/L (ref 135–145)

## 2022-10-07 LAB — MAGNESIUM: Magnesium: 2.1 mg/dL (ref 1.7–2.4)

## 2022-10-07 LAB — RAD ONC ARIA SESSION SUMMARY
Course Elapsed Days: 32
Plan Fractions Treated to Date: 21
Plan Prescribed Dose Per Fraction: 2 Gy
Plan Total Fractions Prescribed: 35
Plan Total Prescribed Dose: 70 Gy
Reference Point Dosage Given to Date: 42 Gy
Reference Point Session Dosage Given: 2 Gy
Session Number: 21

## 2022-10-07 MED ORDER — POTASSIUM CHLORIDE IN NACL 20-0.9 MEQ/L-% IV SOLN
Freq: Once | INTRAVENOUS | Status: AC
  Filled 2022-10-07: qty 1000

## 2022-10-07 MED ORDER — MAGNESIUM SULFATE 2 GM/50ML IV SOLN
2.0000 g | Freq: Once | INTRAVENOUS | Status: AC
  Administered 2022-10-07: 2 g via INTRAVENOUS
  Filled 2022-10-07: qty 50

## 2022-10-07 MED ORDER — HEPARIN SOD (PORK) LOCK FLUSH 100 UNIT/ML IV SOLN
500.0000 [IU] | Freq: Once | INTRAVENOUS | Status: AC | PRN
  Administered 2022-10-07: 500 [IU]
  Filled 2022-10-07: qty 5

## 2022-10-07 MED ORDER — PALONOSETRON HCL INJECTION 0.25 MG/5ML
0.2500 mg | Freq: Once | INTRAVENOUS | Status: AC
  Administered 2022-10-07: 0.25 mg via INTRAVENOUS
  Filled 2022-10-07: qty 5

## 2022-10-07 MED ORDER — SODIUM CHLORIDE 0.9 % IV SOLN
Freq: Once | INTRAVENOUS | Status: AC
  Filled 2022-10-07: qty 250

## 2022-10-07 MED ORDER — SODIUM CHLORIDE 0.9 % IV SOLN
10.0000 mg | Freq: Once | INTRAVENOUS | Status: AC
  Administered 2022-10-07: 10 mg via INTRAVENOUS
  Filled 2022-10-07: qty 10

## 2022-10-07 MED ORDER — SODIUM CHLORIDE 0.9 % IV SOLN
40.0000 mg/m2 | Freq: Once | INTRAVENOUS | Status: AC
  Administered 2022-10-07: 73 mg via INTRAVENOUS
  Filled 2022-10-07: qty 73

## 2022-10-07 MED ORDER — SODIUM CHLORIDE 0.9 % IV SOLN
150.0000 mg | Freq: Once | INTRAVENOUS | Status: AC
  Administered 2022-10-07: 150 mg via INTRAVENOUS
  Filled 2022-10-07: qty 150

## 2022-10-07 NOTE — Progress Notes (Signed)
Nutrition  Received message from nursing that patient wants to cancel nutrition follow-up.    RD available if needed in the future.   Ebbie Sorenson B. Zenia Resides, [place], [place] Registered Dietitian [phone]

## 2022-10-07 NOTE — Progress Notes (Signed)
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  INTERVAL HISTORY: Patient returns to clinic today for further evaluation and consideration of cycle 6 of weekly cisplatin.  She has decreased appetite secondary to poor taste, but otherwise feels well and is tolerating her treatments. She does not complain of pain today.  She has no neurologic complaints.  She denies any recent fevers or illnesses.  She has a good appetite and denies weight loss.  She has no chest pain, shortness of breath, cough, or hemoptysis.  She denies any nausea, vomiting, constipation, or diarrhea.  She has no urinary complaints.  Patient offers no further specific complaints today.  REVIEW OF SYSTEMS:   Review of Systems  Constitutional: Negative.  Negative for fever, malaise/fatigue and weight loss.  HENT:  Negative for sore throat.   Respiratory: Negative.  Negative for cough, hemoptysis and shortness of breath.   Cardiovascular: Negative.  Negative for chest pain and leg swelling.  Gastrointestinal: Negative.  Negative for abdominal pain.  Genitourinary: Negative.  Negative for dysuria.  Musculoskeletal:  Negative for neck pain.  Skin: Negative.  Negative for rash.  Neurological: Negative.  Negative for dizziness, focal weakness, weakness and headaches.  Psychiatric/Behavioral: Negative.  The patient is not nervous/anxious.     As per HPI. Otherwise, a complete review of systems is negative.  PAST MEDICAL HISTORY: Past Medical History:  Diagnosis Date   Depression    Difficult intubation    Endometriosis    Tongue cancer ([place])     PAST SURGICAL HISTORY: Past Surgical History:  Procedure  Laterality Date   ABDOMINAL HYSTERECTOMY  11/20/2018   [facility]   APPENDECTOMY  11/20/2018   [facility]   COLON RESECTION     DIRECT LARYNGOSCOPY N/A 07/21/2022   Procedure: DIRECT MICROLARYNGOSCOPY;  Surgeon: Margaretha Sheffield, MD;  Location: [facility];  Service: ENT;  Laterality: N/A;   IR IMAGING GUIDED PORT INSERTION  09/29/2022   LAPAROSCOPY  01/25/2018   Procedure: LAPAROSCOPY DIAGNOSTIC;  Surgeon: Will Bonnet, MD;  Location: [facility];  Service: Gynecology;;   TONGUE BIOPSY N/A 07/21/2022   Procedure: TONGUE BASE BIOPSY;  Surgeon: Margaretha Sheffield, MD;  Location: [facility];  Service: ENT;  Laterality: N/A;    FAMILY HISTORY: Family History  Problem Relation Age of Onset   Cancer Maternal Uncle     ADVANCED DIRECTIVES (Y/N):  N  HEALTH MAINTENANCE: Social History   Tobacco Use   Smoking status: Former    Packs/day: 0.75    Years: 30.00    Total pack years: 22.50    Types: Cigarettes    Quit date: 07/05/2022    Years since quitting: 0.2   Smokeless tobacco: Never   Tobacco comments:       Vaping Use   Vaping Use: Never used  Substance Use Topics   Alcohol use: No   Drug use: No     Colonoscopy:  PAP:  Bone density:  Lipid panel:  Allergies  Allergen Reactions   Chlorhexidine Gluconate Rash    Current Outpatient Medications  Medication Sig Dispense Refill   HYDROcodone-acetaminophen (NORCO) 5-325 MG tablet Take 1 tablet by mouth every 6 (six) hours as needed for moderate pain. [place]  tablet 0   lidocaine-prilocaine (EMLA) cream Apply 1 Application topically as needed. 30 g 0   ondansetron (ZOFRAN) 8 MG tablet Take 1 tablet (8 mg total) by mouth every 8 (eight) hours as needed for nausea or vomiting. Start on the third day after cisplatin. 60 tablet 1   prochlorperazine (COMPAZINE) 10 MG tablet Take 1 tablet (10 mg total) by mouth every 6 (six) hours as needed (Nausea or vomiting). 60 tablet 1   sucralfate (CARAFATE) 1 g tablet Take 1 tablet (1 g total)  by mouth 4 (four) times daily -  with meals and at bedtime. Dissolve tablet in 3 to 4 tablespoons of warm. 90 tablet 1   traMADol (ULTRAM) 50 MG tablet Take 1 tablet (50 mg total) by mouth every 6 (six) hours as needed. 30 tablet 0   naproxen (NAPROSYN) 500 MG tablet Take 1 tablet (500 mg total) by mouth 2 (two) times daily with a meal. (Patient not taking: Reported on 08/15/2022) 10 tablet 2   No current facility-administered medications for this visit.   Facility-Administered Medications Ordered in Other Visits  Medication Dose Route Frequency Provider Last Rate Last Admin   0.9 %  sodium chloride infusion   Intravenous Once Grayland Ormond, Kathlene November, MD       CISplatin (PLATINOL) 73 mg in sodium chloride 0.9 % 250 mL chemo infusion  40 mg/m2 (Treatment Plan Recorded) Intravenous Once Lloyd Huger, MD       dexamethasone (DECADRON) 10 mg in sodium chloride 0.9 % 50 mL IVPB  10 mg Intravenous Once Lloyd Huger, MD       fosaprepitant (EMEND) 150 mg in sodium chloride 0.9 % 145 mL IVPB  150 mg Intravenous Once Lloyd Huger, MD       heparin lock flush 100 unit/mL  500 Units Intracatheter Once PRN Lloyd Huger, MD       magnesium sulfate IVPB 2 g 50 mL  2 g Intravenous Once Lloyd Huger, MD 50 mL/hr at 10/07/22 0940 2 g at 10/07/22 0940   palonosetron (ALOXI) injection 0.25 mg  0.25 mg Intravenous Once Lloyd Huger, MD        OBJECTIVE: Vitals:   10/07/22 0834  BP: 108/67  Pulse: 77  Resp: 16  Temp: 98.3 F (36.8 C)  SpO2: 100%     Body mass index is 22.24 kg/m.    ECOG FS:0 - Asymptomatic  General: Well-developed, well-nourished, no acute distress. Eyes: Pink conjunctiva, anicteric sclera. HEENT: Normocephalic, moist mucous membranes.  No palpable lymphadenopathy. Lungs: No audible wheezing or coughing. Heart: Regular rate and rhythm. Abdomen: Soft, nontender, no obvious distention. Musculoskeletal: No edema, cyanosis, or clubbing. Neuro: Alert,  answering all questions appropriately. Cranial nerves grossly intact. Skin: No rashes or petechiae noted. Psych: Normal affect.   LAB RESULTS:  Lab Results  Component Value Date   NA 137 10/07/2022   K 4.0 10/07/2022   CL 101 10/07/2022   CO2 27 10/07/2022   GLUCOSE 93 10/07/2022   BUN 10 10/07/2022   CREATININE 0.72 10/07/2022   CALCIUM 9.4 10/07/2022   PROT 8.2 (H) 07/04/2022   ALBUMIN 4.9 07/04/2022   AST 18 07/04/2022   ALT 14 07/04/2022   ALKPHOS 82 07/04/2022   BILITOT 0.6 07/04/2022   GFRNONAA >60 10/07/2022   GFRAA >60 01/15/2018    Lab Results  Component Value Date   WBC 3.7 (L) 10/07/2022   NEUTROABS 2.8 10/07/2022   HGB 12.7 10/07/2022  HCT 36.3 10/07/2022   MCV 89.2 10/07/2022   PLT 111 (L) 10/07/2022     STUDIES: IR IMAGING GUIDED PORT INSERTION  Result Date: 09/29/2022 INDICATION: Head and neck cancer. EXAM: IMPLANTED PORT A CATH PLACEMENT WITH ULTRASOUND AND FLUOROSCOPIC GUIDANCE MEDICATIONS: None ANESTHESIA/SEDATION: Moderate (conscious) sedation was employed during this procedure. A total of Versed 2 mg and Fentanyl 100 mcg was administered intravenously. Moderate Sedation Time: 15 minutes. The patient's level of consciousness and vital signs were monitored continuously by radiology nursing throughout the procedure under my direct supervision. FLUOROSCOPY TIME:  Fluoroscopic dose; 2.5 mGy COMPLICATIONS: None immediate. PROCEDURE: The procedure, risks, benefits, and alternatives were explained to the patient. Questions regarding the procedure were encouraged and answered. The patient understands and consents to the procedure. The RIGHT neck and chest were prepped with chlorhexidine in a sterile fashion, and a sterile drape was applied covering the operative field. Maximum barrier sterile technique with sterile gowns and gloves were used for the procedure. A timeout was performed prior to the initiation of the procedure. Local anesthesia was provided with 1%  lidocaine with epinephrine. After creating a small venotomy incision, a micropuncture kit was utilized to access the internal jugular vein under direct, real-time ultrasound guidance. Ultrasound image documentation was performed. The microwire was kinked to measure appropriate catheter length. A subcutaneous port pocket was then created along the upper chest wall utilizing a combination of sharp and blunt dissection. The pocket was irrigated with sterile saline. A single lumen Non-ISP power injectable port was chosen for placement. The 8 Fr catheter was tunneled from the port pocket site to the venotomy incision. The port was placed in the pocket. The external catheter was trimmed to appropriate length. At the venotomy, an 8 Fr peel-away sheath was placed over a guidewire under fluoroscopic guidance. The catheter was then placed through the sheath and the sheath was removed. Final catheter positioning was confirmed and documented with a fluoroscopic spot radiograph. The port was accessed with a Huber needle, aspirated and flushed with heparinized saline. The port pocket incision was closed with interrupted 3-0 Vicryl suture then Dermabond was applied, including at the venotomy incision. Dressings were placed. The patient tolerated the procedure well without immediate post procedural complication. IMPRESSION: Successful placement of a RIGHT internal jugular approach power injectable Port-A-Cath. The tip of the catheter is positioned within the proximal RIGHT atrium. The catheter is ready for immediate use. Michaelle Birks, MD [facility] Electronically Signed   By: Michaelle Birks M.D.   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  PET scan results from August 10, 2022 reviewed independently with hypermetabolic left base of tongue mass and minimally metabolic lymph node unclear if this is a metastasis or reactive.  Patient will benefit from concurrent chemotherapy using weekly cisplatin 40 mg/m2 with daily XRT.  Patient has now had port placement.  Proceed with cycle 6 of weekly cisplatin today. Continue daily XRT completing on October 26, 2022.  Return to clinic in 1 week for further evaluation and consideration of cycle 7. Pain: Patient does not complain of pain today.  Continue tramadol as needed.  She also has Norco as needed for nighttime use. IV access: Patient now has a  port. Dysphagia: Mild.  Continue Carafate as prescribed. Leukopenia: Mild, monitor. Thrombocytopenia: Mild, monitor.   Patient expressed understanding and was in agreement with this plan. She also understands that She can call clinic at any time with any questions, concerns, or complaints.    Cancer Staging  Tongue cancer [facility]) Staging form: Oral Cavity, AJCC 8th Edition - Clinical stage from 08/05/2022: Stage III (cT3, cN1, cM0) - Signed by Lloyd Huger, MD on 08/05/2022 Stage prefix: Initial diagnosis   Lloyd Huger, MD   10/07/2022 9:41 AM

## 2022-10-07 NOTE — Patient Instructions (Signed)
[facility] AT [place]  Discharge Instructions: Thank you for choosing [facility] to provide your oncology and hematology care.  If you have a lab appointment with the [facility], please go directly to the [facility] and check in at the registration area.  Wear comfortable clothing and clothing appropriate for easy access to any Portacath or PICC line.   We strive to give you quality time with your provider. You may need to reschedule your appointment if you arrive late (15 or more minutes).  Arriving late affects you and other patients whose appointments are after yours.  Also, if you miss three or more appointments without notifying the office, you may be dismissed from the clinic at the provider's discretion.      For prescription refill requests, have your pharmacy contact our office and allow 72 hours for refills to be completed.    Today you received the following chemotherapy and/or immunotherapy agents CISPLATIN      To help prevent nausea and vomiting after your treatment, we encourage you to take your nausea medication as directed.  BELOW ARE SYMPTOMS THAT SHOULD BE REPORTED IMMEDIATELY: *FEVER GREATER THAN 100.4 F (38 C) OR HIGHER *CHILLS OR SWEATING *NAUSEA AND VOMITING THAT IS NOT CONTROLLED WITH YOUR NAUSEA MEDICATION *UNUSUAL SHORTNESS OF BREATH *UNUSUAL BRUISING OR BLEEDING *URINARY PROBLEMS (pain or burning when urinating, or frequent urination) *BOWEL PROBLEMS (unusual diarrhea, constipation, pain near the anus) TENDERNESS IN MOUTH AND THROAT WITH OR WITHOUT PRESENCE OF ULCERS (sore throat, sores in mouth, or a toothache) UNUSUAL RASH, SWELLING OR PAIN  UNUSUAL VAGINAL DISCHARGE OR ITCHING   Items with * indicate a potential emergency and should be followed up as soon as possible or go to the Emergency Department if any problems should occur.  Please show the CHEMOTHERAPY ALERT CARD or IMMUNOTHERAPY ALERT CARD at check-in to  the Emergency Department and triage nurse.  Should you have questions after your visit or need to cancel or reschedule your appointment, please contact [facility] [place] AT [place]  [phone] and follow the prompts.  Office hours are 8:00 a.m. to 4:30 p.m. Monday - Friday. Please note that voicemails left after 4:00 p.m. may not be returned until the following business day.  We are closed weekends and major holidays. You have access to a nurse at all times for urgent questions. Please call the main number to the clinic [phone] and follow the prompts.  For any non-urgent questions, you may also contact your provider using MyChart. We now offer e-Visits for anyone 71 and older to request care online for non-urgent symptoms. For details visit [email or web address].   Also download the MyChart app! Go to the app store, search "MyChart", open the app, select [facility], and log in with your MyChart username and password.  Cisplatin Injection What is this medication? CISPLATIN (SIS pla tin) treats some types of cancer. It works by slowing down the growth of cancer cells. This medicine may be used for other purposes; ask your health care provider or pharmacist if you have questions. COMMON BRAND NAME(S): Platinol, Platinol -AQ What should I tell my care team before I take this medication? They need to know if you have any of these conditions: Eye disease, vision problems Hearing problems Kidney disease Low blood counts, such as low white cells, platelets, or red blood cells Tingling of the fingers or toes, or other nerve disorder An unusual or allergic reaction to cisplatin, carboplatin, oxaliplatin, other medications, foods,  dyes, or preservatives If you or your partner are pregnant or trying to get pregnant Breast-feeding How should I use this medication? This medication is injected into a vein. It is given by your care team in a hospital or clinic setting. Talk to your  care team about the use of this medication in children. Special care may be needed. Overdosage: If you think you have taken too much of this medicine contact a poison control center or emergency room at once. NOTE: This medicine is only for you. Do not share this medicine with others. What if I miss a dose? Keep appointments for follow-up doses. It is important not to miss your dose. Call your care team if you are unable to keep an appointment. What may interact with this medication? Do not take this medication with any of the following: Live virus vaccines This medication may also interact with the following: Certain antibiotics, such as amikacin, gentamicin, neomycin, polymyxin B, streptomycin, tobramycin, vancomycin Foscarnet This list may not describe all possible interactions. Give your health care provider a list of all the medicines, herbs, non-prescription drugs, or dietary supplements you use. Also tell them if you smoke, drink alcohol, or use illegal drugs. Some items may interact with your medicine. What should I watch for while using this medication? Your condition will be monitored carefully while you are receiving this medication. You may need blood work done while taking this medication. This medication may make you feel generally unwell. This is not uncommon, as chemotherapy can affect healthy cells as well as cancer cells. Report any side effects. Continue your course of treatment even though you feel ill unless your care team tells you to stop. This medication may increase your risk of getting an infection. Call your care team for advice if you get a fever, chills, sore throat, or other symptoms of a cold or flu. Do not treat yourself. Try to avoid being around people who are sick. Avoid taking medications that contain aspirin, acetaminophen, ibuprofen, naproxen, or ketoprofen unless instructed by your care team. These medications may hide a fever. This medication may increase your  risk to bruise or bleed. Call your care team if you notice any unusual bleeding. Be careful brushing or flossing your teeth or using a toothpick because you may get an infection or bleed more easily. If you have any dental work done, tell your dentist you are receiving this medication. Drink fluids as directed while you are taking this medication. This will help protect your kidneys. Call your care team if you get diarrhea. Do not treat yourself. Talk to your care team if you or your partner wish to become pregnant or think you might be pregnant. This medication can cause serious birth defects if taken during pregnancy and for 14 months after the last dose. A negative pregnancy test is required before starting this medication. A reliable form of contraception is recommended while taking this medication and for 14 months after the last dose. Talk to your care team about effective forms of contraception. Do not father a child while taking this medication and for 11 months after the last dose. Use a condom during sex during this time period. Do not breast-feed while taking this medication. This medication may cause infertility. Talk to your care team if you are concerned about your fertility. What side effects may I notice from receiving this medication? Side effects that you should report to your care team as soon as possible: Allergic reactions--skin rash, itching, hives,  swelling of the face, lips, tongue, or throat Eye pain, change in vision, vision loss Hearing loss, ringing in ears Infection--fever, chills, cough, sore throat, wounds that don't heal, pain or trouble when passing urine, general feeling of discomfort or being unwell Kidney injury--decrease in the amount of urine, swelling of the ankles, hands, or feet Low red blood cell level--unusual weakness or fatigue, dizziness, headache, trouble breathing Painful swelling, warmth, or redness of the skin, blisters or sores at the infusion  site Pain, tingling, or numbness in the hands or feet Unusual bruising or bleeding Side effects that usually do not require medical attention (report to your care team if they continue or are bothersome): Hair loss Nausea Vomiting This list may not describe all possible side effects. Call your doctor for medical advice about side effects. You may report side effects to FDA at 1-800-FDA-1088. Where should I keep my medication? This medication is given in a hospital or clinic. It will not be stored at home. NOTE: This sheet is a summary. It may not cover all possible information. If you have questions about this medicine, talk to your doctor, pharmacist, or health care provider.  2023 Elsevier/Gold Standard (2022-01-07 00:00:00)

## 2022-10-10 ENCOUNTER — Other Ambulatory Visit: Payer: Self-pay

## 2022-10-10 ENCOUNTER — Ambulatory Visit
Admission: RE | Admit: 2022-10-10 | Discharge: 2022-10-10 | Disposition: A | Discharge status: Home / Self Care | Payer: BC Managed Care – PPO | Source: Ambulatory Visit | Attending: Radiation Oncology | Admitting: Radiation Oncology

## 2022-10-10 DIAGNOSIS — Z510 Encounter for antineoplastic radiation therapy: Secondary | ICD-10-CM | POA: Diagnosis not present

## 2022-10-10 LAB — RAD ONC ARIA SESSION SUMMARY
Course Elapsed Days: 35
Plan Fractions Treated to Date: 22
Plan Prescribed Dose Per Fraction: 2 Gy
Plan Total Fractions Prescribed: 35
Plan Total Prescribed Dose: 70 Gy
Reference Point Dosage Given to Date: 44 Gy
Reference Point Session Dosage Given: 2 Gy
Session Number: 22

## 2022-10-11 ENCOUNTER — Ambulatory Visit
Admission: RE | Admit: 2022-10-11 | Discharge: 2022-10-11 | Disposition: A | Discharge status: Home / Self Care | Payer: BC Managed Care – PPO | Source: Ambulatory Visit | Attending: Radiation Oncology | Admitting: Radiation Oncology

## 2022-10-11 ENCOUNTER — Other Ambulatory Visit: Payer: Self-pay

## 2022-10-11 DIAGNOSIS — Z510 Encounter for antineoplastic radiation therapy: Secondary | ICD-10-CM | POA: Diagnosis not present

## 2022-10-11 LAB — RAD ONC ARIA SESSION SUMMARY
Course Elapsed Days: 36
Plan Fractions Treated to Date: 23
Plan Prescribed Dose Per Fraction: 2 Gy
Plan Total Fractions Prescribed: 35
Plan Total Prescribed Dose: 70 Gy
Reference Point Dosage Given to Date: 46 Gy
Reference Point Session Dosage Given: 2 Gy
Session Number: 23

## 2022-10-12 ENCOUNTER — Ambulatory Visit
Admission: RE | Admit: 2022-10-12 | Discharge: 2022-10-12 | Disposition: A | Discharge status: Home / Self Care | Payer: BC Managed Care – PPO | Source: Ambulatory Visit | Attending: Radiation Oncology | Admitting: Radiation Oncology

## 2022-10-12 ENCOUNTER — Other Ambulatory Visit: Payer: Self-pay

## 2022-10-12 DIAGNOSIS — Z510 Encounter for antineoplastic radiation therapy: Secondary | ICD-10-CM | POA: Diagnosis not present

## 2022-10-12 LAB — RAD ONC ARIA SESSION SUMMARY
Course Elapsed Days: 37
Plan Fractions Treated to Date: 24
Plan Prescribed Dose Per Fraction: 2 Gy
Plan Total Fractions Prescribed: 35
Plan Total Prescribed Dose: 70 Gy
Reference Point Dosage Given to Date: 48 Gy
Reference Point Session Dosage Given: 2 Gy
Session Number: 24

## 2022-10-13 ENCOUNTER — Ambulatory Visit
Admission: RE | Admit: 2022-10-13 | Discharge: 2022-10-13 | Disposition: A | Discharge status: Home / Self Care | Payer: BC Managed Care – PPO | Source: Ambulatory Visit | Attending: Radiation Oncology | Admitting: Radiation Oncology

## 2022-10-13 ENCOUNTER — Other Ambulatory Visit: Payer: Self-pay

## 2022-10-13 ENCOUNTER — Other Ambulatory Visit: Payer: BC Managed Care – PPO

## 2022-10-13 DIAGNOSIS — Z510 Encounter for antineoplastic radiation therapy: Secondary | ICD-10-CM | POA: Diagnosis not present

## 2022-10-13 LAB — RAD ONC ARIA SESSION SUMMARY
Course Elapsed Days: 38
Plan Fractions Treated to Date: 25
Plan Prescribed Dose Per Fraction: 2 Gy
Plan Total Fractions Prescribed: 35
Plan Total Prescribed Dose: 70 Gy
Reference Point Dosage Given to Date: 50 Gy
Reference Point Session Dosage Given: 2 Gy
Session Number: 25

## 2022-10-13 MED FILL — Fosaprepitant Dimeglumine For IV Infusion 150 MG (Base Eq): INTRAVENOUS | Qty: 5 | Status: AC

## 2022-10-14 ENCOUNTER — Inpatient Hospital Stay: Payer: BC Managed Care – PPO

## 2022-10-14 ENCOUNTER — Inpatient Hospital Stay (HOSPITAL_BASED_OUTPATIENT_CLINIC_OR_DEPARTMENT_OTHER): Payer: BC Managed Care – PPO | Admitting: Oncology

## 2022-10-14 ENCOUNTER — Ambulatory Visit
Admission: RE | Admit: 2022-10-14 | Discharge: 2022-10-14 | Disposition: A | Discharge status: Home / Self Care | Payer: BC Managed Care – PPO | Source: Ambulatory Visit | Attending: Radiation Oncology | Admitting: Radiation Oncology

## 2022-10-14 ENCOUNTER — Encounter: Payer: Self-pay | Admitting: Oncology

## 2022-10-14 ENCOUNTER — Other Ambulatory Visit: Payer: Self-pay

## 2022-10-14 VITALS — BP 102/72 | HR 93 | Temp 99.2°F | Resp 16 | Ht 67.0 in | Wt 138.0 lb

## 2022-10-14 DIAGNOSIS — C029 Malignant neoplasm of tongue, unspecified: Secondary | ICD-10-CM

## 2022-10-14 DIAGNOSIS — Z510 Encounter for antineoplastic radiation therapy: Secondary | ICD-10-CM | POA: Diagnosis not present

## 2022-10-14 LAB — RAD ONC ARIA SESSION SUMMARY
Course Elapsed Days: 39
Plan Fractions Treated to Date: 26
Plan Prescribed Dose Per Fraction: 2 Gy
Plan Total Fractions Prescribed: 35
Plan Total Prescribed Dose: 70 Gy
Reference Point Dosage Given to Date: 52 Gy
Reference Point Session Dosage Given: 2 Gy
Session Number: 26

## 2022-10-14 LAB — CBC WITH DIFFERENTIAL/PLATELET
Abs Immature Granulocytes: 0.02 10*3/uL (ref 0.00–0.07)
Basophils Absolute: 0 10*3/uL (ref 0.0–0.1)
Basophils Relative: 1 %
Eosinophils Absolute: 0.1 10*3/uL (ref 0.0–0.5)
Eosinophils Relative: 2 %
HCT: 34.6 % — ABNORMAL LOW (ref 36.0–46.0)
Hemoglobin: 12 g/dL (ref 12.0–15.0)
Immature Granulocytes: 1 %
Lymphocytes Relative: 9 %
Lymphs Abs: 0.3 10*3/uL — ABNORMAL LOW (ref 0.7–4.0)
MCH: 31.3 pg (ref 26.0–34.0)
MCHC: 34.7 g/dL (ref 30.0–36.0)
MCV: 90.3 fL (ref 80.0–100.0)
Monocytes Absolute: 0.3 10*3/uL (ref 0.1–1.0)
Monocytes Relative: 8 %
Neutro Abs: 3.1 10*3/uL (ref 1.7–7.7)
Neutrophils Relative %: 79 %
Platelets: 86 10*3/uL — ABNORMAL LOW (ref 150–400)
RBC: 3.83 MIL/uL — ABNORMAL LOW (ref 3.87–5.11)
RDW: 13.5 % (ref 11.5–15.5)
WBC: 3.8 10*3/uL — ABNORMAL LOW (ref 4.0–10.5)
nRBC: 0 % (ref 0.0–0.2)

## 2022-10-14 LAB — BASIC METABOLIC PANEL
Anion gap: 10 (ref 5–15)
BUN: 11 mg/dL (ref 6–20)
CO2: 25 mmol/L (ref 22–32)
Calcium: 9.5 mg/dL (ref 8.9–10.3)
Chloride: 101 mmol/L (ref 98–111)
Creatinine, Ser: 0.78 mg/dL (ref 0.44–1.00)
GFR, Estimated: >60 mL/min (ref >60)
Glucose, Bld: 132 mg/dL — ABNORMAL HIGH (ref 70–99)
Potassium: 4.1 mmol/L (ref 3.5–5.1)
Sodium: 136 mmol/L (ref 135–145)

## 2022-10-14 LAB — MAGNESIUM: Magnesium: 2.1 mg/dL (ref 1.7–2.4)

## 2022-10-14 MED ORDER — HEPARIN SOD (PORK) LOCK FLUSH 100 UNIT/ML IV SOLN
500.0000 [IU] | Freq: Once | INTRAVENOUS | Status: AC
  Administered 2022-10-14: 500 [IU] via INTRAVENOUS
  Filled 2022-10-14: qty 5

## 2022-10-14 NOTE — Progress Notes (Signed)
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  INTERVAL HISTORY: Patient returns to clinic today for further evaluation and consideration of cycle 7 of weekly cisplatin.  She has increased congestion, but otherwise feels well.  She has decreased appetite secondary to poor taste. She does not complain of pain today.  She has no neurologic complaints.  She denies any recent fevers or illnesses.  She has a good appetite and denies weight loss.  She has no chest pain, shortness of breath, cough, or hemoptysis.  She denies any nausea, vomiting, constipation, or diarrhea.  She has no urinary complaints.  Patient offers no further specific complaints today.  REVIEW OF SYSTEMS:   Review of Systems  Constitutional: Negative.  Negative for fever, malaise/fatigue and weight loss.  HENT:  Negative for sore throat.   Respiratory: Negative.  Negative for cough, hemoptysis and shortness of breath.   Cardiovascular: Negative.  Negative for chest pain and leg swelling.  Gastrointestinal: Negative.  Negative for abdominal pain.  Genitourinary: Negative.  Negative for dysuria.  Musculoskeletal:  Negative for neck pain.  Skin: Negative.  Negative for rash.  Neurological: Negative.  Negative for dizziness, focal weakness, weakness and headaches.  Psychiatric/Behavioral: Negative.  The patient is not nervous/anxious.     As per HPI. Otherwise, a complete review of systems is negative.  PAST MEDICAL HISTORY: Past Medical History:  Diagnosis Date   Depression    Difficult intubation    Endometriosis    Tongue cancer ([place])     PAST SURGICAL HISTORY: Past Surgical History:  Procedure  Laterality Date   ABDOMINAL HYSTERECTOMY  11/20/2018   [facility]   APPENDECTOMY  11/20/2018   [facility]   COLON RESECTION     DIRECT LARYNGOSCOPY N/A 07/21/2022   Procedure: DIRECT MICROLARYNGOSCOPY;  Surgeon: Margaretha Sheffield, MD;  Location: [place];  Service: ENT;  Laterality: N/A;   IR IMAGING GUIDED PORT INSERTION  09/29/2022   LAPAROSCOPY  01/25/2018   Procedure: LAPAROSCOPY DIAGNOSTIC;  Surgeon: Will Bonnet, MD;  Location: [facility];  Service: Gynecology;;   TONGUE BIOPSY N/A 07/21/2022   Procedure: TONGUE BASE BIOPSY;  Surgeon: Margaretha Sheffield, MD;  Location: [place];  Service: ENT;  Laterality: N/A;    FAMILY HISTORY: Family History  Problem Relation Age of Onset   Cancer Maternal Uncle     ADVANCED DIRECTIVES (Y/N):  N  HEALTH MAINTENANCE: Social History   Tobacco Use   Smoking status: Former    Packs/day: 0.75    Years: 30.00    Total pack years: 22.50    Types: Cigarettes    Quit date: 07/05/2022    Years since quitting: 0.2   Smokeless tobacco: Never   Tobacco comments:       Vaping Use   Vaping Use: Never used  Substance Use Topics   Alcohol use: No   Drug use: No     Colonoscopy:  PAP:  Bone density:  Lipid panel:  Allergies  Allergen Reactions   Chlorhexidine Gluconate Rash    Current Outpatient Medications  Medication Sig Dispense Refill   HYDROcodone-acetaminophen (NORCO) 5-325 MG tablet Take 1 tablet by mouth every 6 (six) hours as needed for moderate pain. [place]  tablet 0   lidocaine-prilocaine (EMLA) cream Apply 1 Application topically as needed. 30 g 0   ondansetron (ZOFRAN) 8 MG tablet Take 1 tablet (8 mg total) by mouth every 8 (eight) hours as needed for nausea or vomiting. Start on the third day after cisplatin. 60 tablet 1   prochlorperazine (COMPAZINE) 10 MG tablet Take 1 tablet (10 mg total) by mouth every 6 (six) hours as needed (Nausea or vomiting). 60 tablet 1   sucralfate (CARAFATE) 1 g tablet Take 1 tablet (1 g total)  by mouth 4 (four) times daily -  with meals and at bedtime. Dissolve tablet in 3 to 4 tablespoons of warm. 90 tablet 1   traMADol (ULTRAM) 50 MG tablet Take 1 tablet (50 mg total) by mouth every 6 (six) hours as needed. 30 tablet 0   naproxen (NAPROSYN) 500 MG tablet Take 1 tablet (500 mg total) by mouth 2 (two) times daily with a meal. (Patient not taking: Reported on 08/15/2022) 10 tablet 2   No current facility-administered medications for this visit.    OBJECTIVE: Vitals:   10/14/22 0843  BP: 102/72  Pulse: 93  Resp: 16  Temp: 99.2 F (37.3 C)  SpO2: 99%     Body mass index is 21.61 kg/m.    ECOG FS:0 - Asymptomatic  General: Well-developed, well-nourished, no acute distress. Eyes: Pink conjunctiva, anicteric sclera. HEENT: Normocephalic, moist mucous membranes.  No palpable lymphadenopathy. Lungs: No audible wheezing or coughing. Heart: Regular rate and rhythm. Abdomen: Soft, nontender, no obvious distention. Musculoskeletal: No edema, cyanosis, or clubbing. Neuro: Alert, answering all questions appropriately. Cranial nerves grossly intact. Skin: No rashes or petechiae noted. Psych: Normal affect.  LAB RESULTS:  Lab Results  Component Value Date   NA 137 10/07/2022   K 4.0 10/07/2022   CL 101 10/07/2022   CO2 27 10/07/2022   GLUCOSE 93 10/07/2022   BUN 10 10/07/2022   CREATININE 0.72 10/07/2022   CALCIUM 9.4 10/07/2022   PROT 8.2 (H) 07/04/2022   ALBUMIN 4.9 07/04/2022   AST 18 07/04/2022   ALT 14 07/04/2022   ALKPHOS 82 07/04/2022   BILITOT 0.6 07/04/2022   GFRNONAA >60 10/07/2022   GFRAA >60 01/15/2018    Lab Results  Component Value Date   WBC 3.8 (L) 10/14/2022   NEUTROABS 3.1 10/14/2022   HGB 12.0 10/14/2022   HCT 34.6 (L) 10/14/2022   MCV 90.3 10/14/2022   PLT 86 (L) 10/14/2022     STUDIES: IR IMAGING GUIDED PORT INSERTION  Result Date: 09/29/2022 INDICATION: Head and neck cancer. EXAM: IMPLANTED PORT A CATH PLACEMENT WITH ULTRASOUND AND  FLUOROSCOPIC GUIDANCE MEDICATIONS: None ANESTHESIA/SEDATION: Moderate (conscious) sedation was employed during this procedure. A total of Versed 2 mg and Fentanyl 100 mcg was administered intravenously. Moderate Sedation Time: 15 minutes. The patient's level of consciousness and vital signs were monitored continuously by radiology nursing throughout the procedure under my direct supervision. FLUOROSCOPY TIME:  Fluoroscopic dose; 2.5 mGy COMPLICATIONS: None immediate. PROCEDURE: The procedure, risks, benefits, and alternatives were explained to the patient. Questions regarding the procedure were encouraged and answered. The patient understands and consents to the procedure. The RIGHT neck and chest were prepped with chlorhexidine in a sterile fashion, and a sterile drape was applied covering the operative field. Maximum barrier sterile technique with sterile gowns and gloves were used for the procedure. A timeout was performed prior to the initiation of the procedure. Local anesthesia was provided with 1% lidocaine with epinephrine. After creating a  small venotomy incision, a micropuncture kit was utilized to access the internal jugular vein under direct, real-time ultrasound guidance. Ultrasound image documentation was performed. The microwire was kinked to measure appropriate catheter length. A subcutaneous port pocket was then created along the upper chest wall utilizing a combination of sharp and blunt dissection. The pocket was irrigated with sterile saline. A single lumen Non-ISP power injectable port was chosen for placement. The 8 Fr catheter was tunneled from the port pocket site to the venotomy incision. The port was placed in the pocket. The external catheter was trimmed to appropriate length. At the venotomy, an 8 Fr peel-away sheath was placed over a guidewire under fluoroscopic guidance. The catheter was then placed through the sheath and the sheath was removed. Final catheter positioning was confirmed  and documented with a fluoroscopic spot radiograph. The port was accessed with a Huber needle, aspirated and flushed with heparinized saline. The port pocket incision was closed with interrupted 3-0 Vicryl suture then Dermabond was applied, including at the venotomy incision. Dressings were placed. The patient tolerated the procedure well without immediate post procedural complication. IMPRESSION: Successful placement of a RIGHT internal jugular approach power injectable Port-A-Cath. The tip of the catheter is positioned within the proximal RIGHT atrium. The catheter is ready for immediate use. Michaelle Birks, MD [facility] Radiology Electronically Signed   By: Michaelle Birks M.D.   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  PET scan results from August 10, 2022 reviewed independently with hypermetabolic left base of tongue mass and minimally metabolic lymph node unclear if this is a metastasis or reactive.  Patient will benefit from concurrent chemotherapy using weekly cisplatin 40 mg/m2 with daily XRT.  Patient has now had port placement.  Delay cycle 7 of treatment secondary to thrombocytopenia.  Continue daily XRT completing on October 27, 2022.  Return to clinic in 1 week for further evaluation and consideration of her seventh and final cycle of cisplatin.   Pain: Patient does not complain of pain today.  Continue tramadol as needed.  She also has Norco as needed for nighttime use. IV access: Patient now has a port. Dysphagia: Patient does not have this today.  Continue Carafate as prescribed. Leukopenia: Chronic and unchanged. Thrombocytopenia: Platelets have dropped 86.  Delay treatment as above.   Patient  expressed understanding and was in agreement with this plan. She also understands that She can call clinic at any time with any questions, concerns, or complaints.    Cancer Staging  Tongue cancer [facility]) Staging form: Oral Cavity, AJCC 8th Edition - Clinical stage from 08/05/2022: Stage III (cT3, cN1, cM0) - Signed by Lloyd Huger, MD on 08/05/2022 Stage prefix: Initial diagnosis   Lloyd Huger, MD   10/14/2022 9:47 AM

## 2022-10-17 ENCOUNTER — Ambulatory Visit
Admission: RE | Admit: 2022-10-17 | Discharge: 2022-10-17 | Disposition: A | Discharge status: Home / Self Care | Payer: BC Managed Care – PPO | Source: Ambulatory Visit | Attending: Radiation Oncology | Admitting: Radiation Oncology

## 2022-10-17 ENCOUNTER — Inpatient Hospital Stay (HOSPITAL_BASED_OUTPATIENT_CLINIC_OR_DEPARTMENT_OTHER): Payer: BC Managed Care – PPO | Admitting: Nurse Practitioner

## 2022-10-17 ENCOUNTER — Other Ambulatory Visit: Payer: Self-pay

## 2022-10-17 DIAGNOSIS — Z510 Encounter for antineoplastic radiation therapy: Secondary | ICD-10-CM | POA: Diagnosis not present

## 2022-10-17 DIAGNOSIS — L239 Allergic contact dermatitis, unspecified cause: Secondary | ICD-10-CM

## 2022-10-17 LAB — RAD ONC ARIA SESSION SUMMARY
Course Elapsed Days: 42
Plan Fractions Treated to Date: 27
Plan Prescribed Dose Per Fraction: 2 Gy
Plan Total Fractions Prescribed: 35
Plan Total Prescribed Dose: 70 Gy
Reference Point Dosage Given to Date: 54 Gy
Reference Point Session Dosage Given: 2 Gy
Session Number: 27

## 2022-10-17 MED ORDER — TRIAMCINOLONE ACETONIDE 0.1 % EX CREA: 1.0000 | TOPICAL_CREAM | Freq: Two times a day (BID) | CUTANEOUS | 0 refills | Status: DC

## 2022-10-17 MED ORDER — PREDNISONE 20 MG PO TABS: 20.0000 mg | ORAL_TABLET | Freq: Every day | ORAL | 0 refills | Status: AC

## 2022-10-17 NOTE — Progress Notes (Signed)
[facility] [place] [place] [address], [place], [place] [address] [phone] (phone) [phone] (fax)  Patient Care Team: Patient, [name] as PCP - General (General Practice) Lloyd Huger, MD as Consulting Physician (Oncology)   Name of the patient: Holly Vega  [id number]  1976/08/25   Date of visit: 10/17/22  Diagnosis- Tongue Cancer  Chief complaint/ Reason for visit- rash  Heme/Onc history:  Oncology History  Tongue cancer ([place])  08/05/2022 Initial Diagnosis   Tongue cancer ([place])   08/05/2022 Cancer Staging   Staging form: Oral Cavity, AJCC 8th Edition - Clinical stage from 08/05/2022: Stage III (cT3, cN1, cM0) - Signed by Lloyd Huger, MD on 08/05/2022 Stage prefix: Initial diagnosis   08/30/2022 -  Chemotherapy   Patient is on Treatment Plan : HEAD/NECK Cisplatin (40) q7d       Interval history- Patient is 47 year old female currently undergoing concurrent chemo and radiation for tongue cancer who presents [facility] for complaints of rash and oozing from her port site. She noticed redness and irritation right after port was placed. Has worsened since that time. Itchy, small bumps. When she removed bandaid from upper suture incision, some skin came off and she's noticed oozing of clear fluid.Not painful. No fever, chills. Says her counts have been low with chemo. Is receiving radiation to opposite side of her neck.   Review of systems- Review of Systems  Constitutional:  Positive for malaise/fatigue. Negative for chills, fever and weight loss.  Skin:  Positive for itching and rash.     Allergies  Allergen Reactions   Chlorhexidine Gluconate Rash    Past Medical History:  Diagnosis Date   Depression    Difficult intubation    Endometriosis    Tongue cancer [facility])     Past Surgical History:  Procedure  Laterality Date   ABDOMINAL HYSTERECTOMY  11/20/2018   [facility]   APPENDECTOMY  11/20/2018   [facility]   COLON RESECTION     DIRECT LARYNGOSCOPY N/A 07/21/2022   Procedure: DIRECT MICROLARYNGOSCOPY;  Surgeon: Margaretha Sheffield, MD;  Location: [place];  Service: ENT;  Laterality: N/A;   IR IMAGING GUIDED PORT INSERTION  09/29/2022   LAPAROSCOPY  01/25/2018   Procedure: LAPAROSCOPY DIAGNOSTIC;  Surgeon: Will Bonnet, MD;  Location: [facility];  Service: Gynecology;;   TONGUE BIOPSY N/A 07/21/2022   Procedure: TONGUE BASE BIOPSY;  Surgeon: Margaretha Sheffield, MD;  Location: [place];  Service: ENT;  Laterality: N/A;    Social History   Socioeconomic History   Marital status: Married    Spouse name: Not on file   Number of children: Not on file   Years of education: Not on file   Highest education level: Not on file  Occupational History   Not on file  Tobacco Use   Smoking status: Former    Packs/day: 0.75    Years: 30.00    Total pack years: 22.50    Types: Cigarettes    Quit date: 07/05/2022    Years since quitting: 0.2   Smokeless tobacco: Never   Tobacco comments:       Vaping Use   Vaping Use: Never used  Substance and Sexual Activity   Alcohol use: No   Drug use: No   Sexual activity: Yes    Birth control/protection: None, Other-see comments    Comment: husband  vasectomy  Other Topics Concern   Not on file  Social History Narrative   Not on file   Social Determinants of Health   Financial Resource Strain: Not on file  Food Insecurity: Not on file  Transportation Needs: Not on file  Physical Activity: Inactive (02/09/2018)   Exercise Vital Sign    Days of Exercise per Week: 0 days    Minutes of Exercise per Session: 0 min  Stress: Not on file  Social Connections: Not on file  Intimate Partner Violence: Not on file    Family History  Problem Relation Age of Onset   Cancer Maternal Uncle      Current Outpatient Medications:     HYDROcodone-acetaminophen (NORCO) 5-325 MG tablet, Take 1 tablet by mouth every 6 (six) hours as needed for moderate pain., Disp: 60 tablet, Rfl: 0   lidocaine-prilocaine (EMLA) cream, Apply 1 Application topically as needed., Disp: 30 g, Rfl: 0   naproxen (NAPROSYN) 500 MG tablet, Take 1 tablet (500 mg total) by mouth 2 (two) times daily with a meal. (Patient not taking: Reported on 08/15/2022), Disp: 10 tablet, Rfl: 2   ondansetron (ZOFRAN) 8 MG tablet, Take 1 tablet (8 mg total) by mouth every 8 (eight) hours as needed for nausea or vomiting. Start on the third day after cisplatin., Disp: 60 tablet, Rfl: 1   prochlorperazine (COMPAZINE) 10 MG tablet, Take 1 tablet (10 mg total) by mouth every 6 (six) hours as needed (Nausea or vomiting)., Disp: 60 tablet, Rfl: 1   sucralfate (CARAFATE) 1 g tablet, Take 1 tablet (1 g total) by mouth 4 (four) times daily -  with meals and at bedtime. Dissolve tablet in 3 to 4 tablespoons of warm., Disp: 90 tablet, Rfl: 1   traMADol (ULTRAM) 50 MG tablet, Take 1 tablet (50 mg total) by mouth every 6 (six) hours as needed., Disp: 30 tablet, Rfl: 0  Physical exam: There were no vitals filed for this visit. Physical Exam Constitutional:      Appearance: She is not ill-appearing.  Skin:    General: Skin is dry.     Findings: Rash present.  Neurological:     Mental Status: She is alert and oriented to person, place, and time.        IR IMAGING GUIDED PORT INSERTION  Result Date: 09/29/2022 INDICATION: Head and neck cancer. EXAM: IMPLANTED PORT A CATH PLACEMENT WITH ULTRASOUND AND FLUOROSCOPIC GUIDANCE MEDICATIONS: None ANESTHESIA/SEDATION: Moderate (conscious) sedation was employed during this procedure. A total of Versed 2 mg and Fentanyl 100 mcg was administered intravenously. Moderate Sedation Time: 15 minutes. The patient's level of consciousness and vital signs were monitored continuously by radiology nursing throughout the procedure under my direct  supervision. FLUOROSCOPY TIME:  Fluoroscopic dose; 2.5 mGy COMPLICATIONS: None immediate. PROCEDURE: The procedure, risks, benefits, and alternatives were explained to the patient. Questions regarding the procedure were encouraged and answered. The patient understands and consents to the procedure. The RIGHT neck and chest were prepped with chlorhexidine in a sterile fashion, and a sterile drape was applied covering the operative field. Maximum barrier sterile technique with sterile gowns and gloves were used for the procedure. A timeout was performed prior to the initiation of the procedure. Local anesthesia was provided with 1% lidocaine with epinephrine. After creating a small venotomy incision, a micropuncture kit was utilized to access the internal jugular vein under direct, real-time ultrasound guidance. Ultrasound image documentation was performed. The microwire was kinked to measure appropriate catheter length. A subcutaneous  port pocket was then created along the upper chest wall utilizing a combination of sharp and blunt dissection. The pocket was irrigated with sterile saline. A single lumen Non-ISP power injectable port was chosen for placement. The 8 Fr catheter was tunneled from the port pocket site to the venotomy incision. The port was placed in the pocket. The external catheter was trimmed to appropriate length. At the venotomy, an 8 Fr peel-away sheath was placed over a guidewire under fluoroscopic guidance. The catheter was then placed through the sheath and the sheath was removed. Final catheter positioning was confirmed and documented with a fluoroscopic spot radiograph. The port was accessed with a Huber needle, aspirated and flushed with heparinized saline. The port pocket incision was closed with interrupted 3-0 Vicryl suture then Dermabond was applied, including at the venotomy incision. Dressings were placed. The patient tolerated the procedure well without immediate post procedural  complication. IMPRESSION: Successful placement of a RIGHT internal jugular approach power injectable Port-A-Cath. The tip of the catheter is positioned within the proximal RIGHT atrium. The catheter is ready for immediate use. Michaelle Birks, MD [facility] Radiology Electronically Signed   By: Michaelle Birks M.D.   On: 09/29/2022 09:57    Assessment and plan- Patient is a 47 y.o. female currently undergoing chemo-radiation for tongue cancer who presents to [facility] for complaints of   Skin irritation- suspect contact dermatitis d/t chlorhexidine and adhesive dressings which patient has known allergy to. I've advised her to clean skin with gentle soap such as baby shampoo. Apply topical steroid cream, triamcinolone topically twice a day. Once absorbed, apply barrier cream such as aquaphor. The areas of the upper port insertion site have some exudate. Advised to apply topical petroleum jelly and dry gauze. Avoid tapes and adhesives as much as possible. Will need to consider alternative cleansers and tapes prior to accessing port. Given the irritation of her skin she is highly likely to have skin layers removed with strong adhesives. I will also send 5 days of prednisone 20 mg once daily.   Will recheck skin on Thursday to see if she will be able to have port accessed for chemo on Friday. Guarded however given degree of irritation. Additionally, she is high risk of infection, monitor closely.    Visit Diagnosis 1. Allergic contact dermatitis, unspecified trigger     Patient expressed understanding and was in agreement with this plan. She also understands that She can call clinic at any time with any questions, concerns, or complaints.   Thank you for allowing me to participate in the care of this very pleasant patient.   Beckey Rutter, DNP, AGNP-C, [facility] [place]  CC: Dr. Grayland Ormond

## 2022-10-18 ENCOUNTER — Ambulatory Visit: Payer: BC Managed Care – PPO

## 2022-10-19 ENCOUNTER — Other Ambulatory Visit: Payer: Self-pay

## 2022-10-19 ENCOUNTER — Ambulatory Visit: Payer: BC Managed Care – PPO

## 2022-10-20 ENCOUNTER — Inpatient Hospital Stay (HOSPITAL_BASED_OUTPATIENT_CLINIC_OR_DEPARTMENT_OTHER): Payer: BC Managed Care – PPO | Admitting: Nurse Practitioner

## 2022-10-20 ENCOUNTER — Encounter: Payer: Self-pay | Admitting: Nurse Practitioner

## 2022-10-20 ENCOUNTER — Encounter: Payer: BC Managed Care – PPO | Admitting: Nurse Practitioner

## 2022-10-20 ENCOUNTER — Inpatient Hospital Stay: Payer: BC Managed Care – PPO

## 2022-10-20 ENCOUNTER — Ambulatory Visit
Admission: RE | Admit: 2022-10-20 | Discharge: 2022-10-20 | Disposition: A | Discharge status: Home / Self Care | Payer: BC Managed Care – PPO | Source: Ambulatory Visit | Attending: Radiation Oncology | Admitting: Radiation Oncology

## 2022-10-20 ENCOUNTER — Other Ambulatory Visit: Payer: Self-pay

## 2022-10-20 VITALS — BP 122/86 | HR 77 | Temp 97.2°F | Wt 134.0 lb

## 2022-10-20 DIAGNOSIS — L239 Allergic contact dermatitis, unspecified cause: Secondary | ICD-10-CM

## 2022-10-20 DIAGNOSIS — Z510 Encounter for antineoplastic radiation therapy: Secondary | ICD-10-CM | POA: Diagnosis not present

## 2022-10-20 DIAGNOSIS — C029 Malignant neoplasm of tongue, unspecified: Secondary | ICD-10-CM

## 2022-10-20 LAB — CBC WITH DIFFERENTIAL/PLATELET
Abs Immature Granulocytes: 0.02 10*3/uL (ref 0.00–0.07)
Basophils Absolute: 0 10*3/uL (ref 0.0–0.1)
Basophils Relative: 0 %
Eosinophils Absolute: 0 10*3/uL (ref 0.0–0.5)
Eosinophils Relative: 1 %
HCT: 36.5 % (ref 36.0–46.0)
Hemoglobin: 12.2 g/dL (ref 12.0–15.0)
Immature Granulocytes: 0 %
Lymphocytes Relative: 5 %
Lymphs Abs: 0.3 10*3/uL — ABNORMAL LOW (ref 0.7–4.0)
MCH: 31.3 pg (ref 26.0–34.0)
MCHC: 33.4 g/dL (ref 30.0–36.0)
MCV: 93.6 fL (ref 80.0–100.0)
Monocytes Absolute: 0.2 10*3/uL (ref 0.1–1.0)
Monocytes Relative: 3 %
Neutro Abs: 4.9 10*3/uL (ref 1.7–7.7)
Neutrophils Relative %: 91 %
Platelets: 115 10*3/uL — ABNORMAL LOW (ref 150–400)
RBC: 3.9 MIL/uL (ref 3.87–5.11)
RDW: 14.3 % (ref 11.5–15.5)
WBC: 5.4 10*3/uL (ref 4.0–10.5)
nRBC: 0 % (ref 0.0–0.2)

## 2022-10-20 LAB — BASIC METABOLIC PANEL
Anion gap: 12 (ref 5–15)
BUN: 15 mg/dL (ref 6–20)
CO2: 25 mmol/L (ref 22–32)
Calcium: 9.6 mg/dL (ref 8.9–10.3)
Chloride: 98 mmol/L (ref 98–111)
Creatinine, Ser: 0.78 mg/dL (ref 0.44–1.00)
GFR, Estimated: >60 mL/min (ref >60)
Glucose, Bld: 129 mg/dL — ABNORMAL HIGH (ref 70–99)
Potassium: 3.9 mmol/L (ref 3.5–5.1)
Sodium: 135 mmol/L (ref 135–145)

## 2022-10-20 LAB — RAD ONC ARIA SESSION SUMMARY
Course Elapsed Days: 45
Plan Fractions Treated to Date: 28
Plan Prescribed Dose Per Fraction: 2 Gy
Plan Total Fractions Prescribed: 35
Plan Total Prescribed Dose: 70 Gy
Reference Point Dosage Given to Date: 56 Gy
Reference Point Session Dosage Given: 2 Gy
Session Number: 28

## 2022-10-20 LAB — MAGNESIUM: Magnesium: 2.3 mg/dL (ref 1.7–2.4)

## 2022-10-20 MED FILL — Dexamethasone Sodium Phosphate Inj 100 MG/10ML: INTRAMUSCULAR | Qty: 1 | Status: AC

## 2022-10-20 MED FILL — Fosaprepitant Dimeglumine For IV Infusion 150 MG (Base Eq): INTRAVENOUS | Qty: 5 | Status: AC

## 2022-10-20 NOTE — Progress Notes (Signed)
[facility] [place] [facility] [place]. [facility] [address] [place], [place] [address] [phone] (phone) [phone] (fax)  Patient Care Team: Patient, [name] as PCP - General (General Practice) Lloyd Huger, MD as Consulting Physician (Oncology)   Name of the patient: Holly Vega  [id number]  12-19-1975   Date of visit: 10/20/22  Diagnosis- Tongue Cancer  Chief complaint/ Reason for visit- rash  Heme/Onc history:  Oncology History  Tongue cancer ([place])  08/05/2022 Initial Diagnosis   Tongue cancer ([place])   08/05/2022 Cancer Staging   Staging form: Oral Cavity, AJCC 8th Edition - Clinical stage from 08/05/2022: Stage III (cT3, cN1, cM0) - Signed by Lloyd Huger, MD on 08/05/2022 Stage prefix: Initial diagnosis   08/30/2022 -  Chemotherapy   Patient is on Treatment Plan : HEAD/NECK Cisplatin (40) q7d       Interval history- Patient is 47 year old female currently undergoing concurrent chemo and radiation for tongue cancer who [facility] for reevaluation of rash of her port site. Itching was somewhat better. Has less scaling. Area of oozing has resolved. She is eager to receive her last dose of chemotherapy.   Review of systems- Review of Systems  Constitutional:  Positive for malaise/fatigue. Negative for chills, fever and weight loss.  Skin:  Positive for itching and rash.     Allergies  Allergen Reactions   Chlorhexidine Gluconate Rash    Past Medical History:  Diagnosis Date   Depression    Difficult intubation    Endometriosis    Tongue cancer [facility])     Past Surgical History:  Procedure Laterality Date   ABDOMINAL HYSTERECTOMY  11/20/2018   [facility]   APPENDECTOMY  11/20/2018   [facility]   COLON RESECTION     DIRECT LARYNGOSCOPY N/A 07/21/2022   Procedure: DIRECT MICROLARYNGOSCOPY;  Surgeon: Margaretha Sheffield, MD;   Location: [place];  Service: ENT;  Laterality: N/A;   IR IMAGING GUIDED PORT INSERTION  09/29/2022   LAPAROSCOPY  01/25/2018   Procedure: LAPAROSCOPY DIAGNOSTIC;  Surgeon: Will Bonnet, MD;  Location: [facility];  Service: Gynecology;;   TONGUE BIOPSY N/A 07/21/2022   Procedure: TONGUE BASE BIOPSY;  Surgeon: Margaretha Sheffield, MD;  Location: [place];  Service: ENT;  Laterality: N/A;    Social History   Socioeconomic History   Marital status: Married    Spouse name: Not on file   Number of children: Not on file   Years of education: Not on file   Highest education level: Not on file  Occupational History   Not on file  Tobacco Use   Smoking status: Former    Packs/day: 0.75    Years: 30.00    Total pack years: 22.50    Types: Cigarettes    Quit date: 07/05/2022    Years since quitting: 0.2   Smokeless tobacco: Never   Tobacco comments:       Vaping Use   Vaping Use: Never used  Substance and Sexual Activity   Alcohol use: No   Drug use: No   Sexual activity: Yes    Birth control/protection: None, Other-see comments    Comment: husband vasectomy  Other Topics Concern   Not on file  Social History Narrative   Not on file   Social Determinants of Health   Financial Resource Strain: Not on file  Food Insecurity: Not on  file  Transportation Needs: Not on file  Physical Activity: Inactive (02/09/2018)   Exercise Vital Sign    Days of Exercise per Week: 0 days    Minutes of Exercise per Session: 0 min  Stress: Not on file  Social Connections: Not on file  Intimate Partner Violence: Not on file    Family History  Problem Relation Age of Onset   Cancer Maternal Uncle      Current Outpatient Medications:    HYDROcodone-acetaminophen (NORCO) 5-325 MG tablet, Take 1 tablet by mouth every 6 (six) hours as needed for moderate pain., Disp: 60 tablet, Rfl: 0   lidocaine-prilocaine (EMLA) cream, Apply 1 Application topically as needed., Disp: 30 g,  Rfl: 0   ondansetron (ZOFRAN) 8 MG tablet, Take 1 tablet (8 mg total) by mouth every 8 (eight) hours as needed for nausea or vomiting. Start on the third day after cisplatin., Disp: 60 tablet, Rfl: 1   predniSONE (DELTASONE) 20 MG tablet, Take 1 tablet (20 mg total) by mouth daily with breakfast for 5 days., Disp: 5 tablet, Rfl: 0   prochlorperazine (COMPAZINE) 10 MG tablet, Take 1 tablet (10 mg total) by mouth every 6 (six) hours as needed (Nausea or vomiting)., Disp: 60 tablet, Rfl: 1   sucralfate (CARAFATE) 1 g tablet, Take 1 tablet (1 g total) by mouth 4 (four) times daily -  with meals and at bedtime. Dissolve tablet in 3 to 4 tablespoons of warm., Disp: 90 tablet, Rfl: 1   traMADol (ULTRAM) 50 MG tablet, Take 1 tablet (50 mg total) by mouth every 6 (six) hours as needed., Disp: 30 tablet, Rfl: 0   triamcinolone cream (KENALOG) 0.1 %, Apply 1 Application topically 2 (two) times daily. To skin irritation of right upper chest, Disp: 80 g, Rfl: 0   naproxen (NAPROSYN) 500 MG tablet, Take 1 tablet (500 mg total) by mouth 2 (two) times daily with a meal. (Patient not taking: Reported on 08/15/2022), Disp: 10 tablet, Rfl: 2  Physical exam:  Vitals:   10/20/22 1354  BP: 122/86  Pulse: 77  Temp: (!) 97.2 F (36.2 C)  TempSrc: Tympanic  Weight: 134 lb (60.8 kg)   Physical Exam Constitutional:      Appearance: She is not ill-appearing.  Skin:    General: Skin is dry.     Findings: Rash (persistent redness but scaling has improved) present.  Neurological:     Mental Status: She is alert and oriented to person, place, and time.      1/22   1/25   IR IMAGING GUIDED PORT INSERTION  Result Date: 09/29/2022 INDICATION: Head and neck cancer. EXAM: IMPLANTED PORT A CATH PLACEMENT WITH ULTRASOUND AND FLUOROSCOPIC GUIDANCE MEDICATIONS: None ANESTHESIA/SEDATION: Moderate (conscious) sedation was employed during this procedure. A total of Versed 2 mg and Fentanyl 100 mcg was administered  intravenously. Moderate Sedation Time: 15 minutes. The patient's level of consciousness and vital signs were monitored continuously by radiology nursing throughout the procedure under my direct supervision. FLUOROSCOPY TIME:  Fluoroscopic dose; 2.5 mGy COMPLICATIONS: None immediate. PROCEDURE: The procedure, risks, benefits, and alternatives were explained to the patient. Questions regarding the procedure were encouraged and answered. The patient understands and consents to the procedure. The RIGHT neck and chest were prepped with chlorhexidine in a sterile fashion, and a sterile drape was applied covering the operative field. Maximum barrier sterile technique with sterile gowns and gloves were used for the procedure. A timeout was performed prior to the initiation of the procedure.  Local anesthesia was provided with 1% lidocaine with epinephrine. After creating a small venotomy incision, a micropuncture kit was utilized to access the internal jugular vein under direct, real-time ultrasound guidance. Ultrasound image documentation was performed. The microwire was kinked to measure appropriate catheter length. A subcutaneous port pocket was then created along the upper chest wall utilizing a combination of sharp and blunt dissection. The pocket was irrigated with sterile saline. A single lumen Non-ISP power injectable port was chosen for placement. The 8 Fr catheter was tunneled from the port pocket site to the venotomy incision. The port was placed in the pocket. The external catheter was trimmed to appropriate length. At the venotomy, an 8 Fr peel-away sheath was placed over a guidewire under fluoroscopic guidance. The catheter was then placed through the sheath and the sheath was removed. Final catheter positioning was confirmed and documented with a fluoroscopic spot radiograph. The port was accessed with a Huber needle, aspirated and flushed with heparinized saline. The port pocket incision was closed with  interrupted 3-0 Vicryl suture then Dermabond was applied, including at the venotomy incision. Dressings were placed. The patient tolerated the procedure well without immediate post procedural complication. IMPRESSION: Successful placement of a RIGHT internal jugular approach power injectable Port-A-Cath. The tip of the catheter is positioned within the proximal RIGHT atrium. The catheter is ready for immediate use. Michaelle Birks, MD [facility] Electronically Signed   By: Michaelle Birks M.D.   On: 09/29/2022 09:57    Assessment and plan- Patient is a 47 y.o. female currently undergoing chemo-radiation for tongue cancer who presents to [facility] for complaints of   Skin irritation- suspect contact dermatitis d/t chlorhexidine and adhesive dressings which patient has known allergy to. Continue gentle soap to clean area. Continue topical steroid cream and barrier cream such as aquaphor.   Follow up as needed. RTC if symptoms don't improve or worsen in interim.    Visit Diagnosis 1. Allergic contact dermatitis, unspecified trigger    Patient expressed understanding and was in agreement with this plan. She also understands that She can call clinic at any time with any questions, concerns, or complaints.   Thank you for allowing me to participate in the care of this very pleasant patient.   Beckey Rutter, DNP, AGNP-C, [place] [facility] [place]

## 2022-10-21 ENCOUNTER — Other Ambulatory Visit: Payer: BC Managed Care – PPO

## 2022-10-21 ENCOUNTER — Ambulatory Visit
Admission: RE | Admit: 2022-10-21 | Discharge: 2022-10-21 | Disposition: A | Discharge status: Home / Self Care | Payer: BC Managed Care – PPO | Source: Ambulatory Visit | Attending: Radiation Oncology | Admitting: Radiation Oncology

## 2022-10-21 ENCOUNTER — Inpatient Hospital Stay (HOSPITAL_BASED_OUTPATIENT_CLINIC_OR_DEPARTMENT_OTHER): Payer: BC Managed Care – PPO | Admitting: Oncology

## 2022-10-21 ENCOUNTER — Encounter: Payer: Self-pay | Admitting: Oncology

## 2022-10-21 ENCOUNTER — Inpatient Hospital Stay: Payer: BC Managed Care – PPO

## 2022-10-21 ENCOUNTER — Ambulatory Visit: Payer: BC Managed Care – PPO

## 2022-10-21 ENCOUNTER — Other Ambulatory Visit: Payer: Self-pay

## 2022-10-21 ENCOUNTER — Ambulatory Visit: Payer: BC Managed Care – PPO | Admitting: Oncology

## 2022-10-21 DIAGNOSIS — C029 Malignant neoplasm of tongue, unspecified: Secondary | ICD-10-CM

## 2022-10-21 DIAGNOSIS — Z510 Encounter for antineoplastic radiation therapy: Secondary | ICD-10-CM | POA: Diagnosis not present

## 2022-10-21 LAB — RAD ONC ARIA SESSION SUMMARY
Course Elapsed Days: 46
Plan Fractions Treated to Date: 29
Plan Prescribed Dose Per Fraction: 2 Gy
Plan Total Fractions Prescribed: 35
Plan Total Prescribed Dose: 70 Gy
Reference Point Dosage Given to Date: 58 Gy
Reference Point Session Dosage Given: 2 Gy
Session Number: 29

## 2022-10-21 MED ORDER — PALONOSETRON HCL INJECTION 0.25 MG/5ML
0.2500 mg | Freq: Once | INTRAVENOUS | Status: AC
  Administered 2022-10-21: 0.25 mg via INTRAVENOUS
  Filled 2022-10-21: qty 5

## 2022-10-21 MED ORDER — POTASSIUM CHLORIDE IN NACL 20-0.9 MEQ/L-% IV SOLN
Freq: Once | INTRAVENOUS | Status: AC
  Filled 2022-10-21: qty 1000

## 2022-10-21 MED ORDER — MAGNESIUM SULFATE 2 GM/50ML IV SOLN
2.0000 g | Freq: Once | INTRAVENOUS | Status: AC
  Administered 2022-10-21: 2 g via INTRAVENOUS
  Filled 2022-10-21: qty 50

## 2022-10-21 MED ORDER — HEPARIN SOD (PORK) LOCK FLUSH 100 UNIT/ML IV SOLN
500.0000 [IU] | Freq: Once | INTRAVENOUS | Status: AC | PRN
  Administered 2022-10-21: 500 [IU]
  Filled 2022-10-21: qty 5

## 2022-10-21 MED ORDER — SODIUM CHLORIDE 0.9 % IV SOLN
10.0000 mg | Freq: Once | INTRAVENOUS | Status: AC
  Administered 2022-10-21: 10 mg via INTRAVENOUS
  Filled 2022-10-21: qty 10
  Filled 2022-10-21: qty 1

## 2022-10-21 MED ORDER — SODIUM CHLORIDE 0.9 % IV SOLN
40.0000 mg/m2 | Freq: Once | INTRAVENOUS | Status: AC
  Administered 2022-10-21: 73 mg via INTRAVENOUS
  Filled 2022-10-21: qty 73

## 2022-10-21 MED ORDER — SODIUM CHLORIDE 0.9 % IV SOLN
150.0000 mg | Freq: Once | INTRAVENOUS | Status: AC
  Administered 2022-10-21: 150 mg via INTRAVENOUS
  Filled 2022-10-21: qty 150
  Filled 2022-10-21: qty 5

## 2022-10-21 MED ORDER — SODIUM CHLORIDE 0.9% FLUSH
10.0000 mL | INTRAVENOUS | Status: DC | PRN
  Administered 2022-10-21: 10 mL
  Filled 2022-10-21: qty 10

## 2022-10-21 MED ORDER — SODIUM CHLORIDE 0.9 % IV SOLN
Freq: Once | INTRAVENOUS | Status: AC
  Filled 2022-10-21: qty 250

## 2022-10-21 NOTE — Patient Instructions (Signed)
[facility] [place] [facility]  Discharge Instructions: Thank you for choosing [facility] to provide your oncology and hematology care.  If you have a lab appointment with the Cancer Center, please go directly to the Cancer Center and check in at the registration area.  Wear comfortable clothing and clothing appropriate for easy access to any Portacath or PICC line.   We strive to give you quality time with your provider. You may need to reschedule your appointment if you arrive late (15 or more minutes).  Arriving late affects you and other patients whose appointments are after yours.  Also, if you miss three or more appointments without notifying the office, you may be dismissed from the clinic at the provider's discretion.      For prescription refill requests, have your pharmacy contact our office and allow 72 hours for refills to be completed.    Today you received the following chemotherapy and/or immunotherapy agents CISPLATIN      To help prevent nausea and vomiting after your treatment, we encourage you to take your nausea medication as directed.  BELOW ARE SYMPTOMS THAT SHOULD BE REPORTED IMMEDIATELY: *FEVER GREATER THAN 100.4 F (38 C) OR HIGHER *CHILLS OR SWEATING *NAUSEA AND VOMITING THAT IS NOT CONTROLLED WITH YOUR NAUSEA MEDICATION *UNUSUAL SHORTNESS OF BREATH *UNUSUAL BRUISING OR BLEEDING *URINARY PROBLEMS (pain or burning when urinating, or frequent urination) *BOWEL PROBLEMS (unusual diarrhea, constipation, pain near the anus) TENDERNESS IN MOUTH AND THROAT WITH OR WITHOUT PRESENCE OF ULCERS (sore throat, sores in mouth, or a toothache) UNUSUAL RASH, SWELLING OR PAIN  UNUSUAL VAGINAL DISCHARGE OR ITCHING   Items with * indicate a potential emergency and should be followed up as soon as possible or go to the Emergency Department if any problems should occur.  Please show the CHEMOTHERAPY ALERT CARD or IMMUNOTHERAPY ALERT CARD at check-in to  the Emergency Department and triage nurse.  Should you have questions after your visit or need to cancel or reschedule your appointment, please contact [facility] [place] [facility]  [phone] and follow the prompts.  Office hours are 8:00 a.m. to 4:30 p.m. Monday - Friday. Please note that voicemails left after 4:00 p.m. may not be returned until the following business day.  We are closed weekends and major holidays. You have access to a nurse at all times for urgent questions. Please call the main number to the clinic [phone] and follow the prompts.  For any non-urgent questions, you may also contact your provider using MyChart. We now offer e-Visits for anyone 18 and older to request care online for non-urgent symptoms. For details visit [email or web address].   Also download the MyChart app! Go to the app store, search "MyChart", open the app, select , and log in with your MyChart username and password.  Cisplatin Injection What is this medication? CISPLATIN (SIS pla tin) treats some types of cancer. It works by slowing down the growth of cancer cells. This medicine may be used for other purposes; ask your health care provider or pharmacist if you have questions. COMMON BRAND NAME(S): Platinol, Platinol -AQ What should I tell my care team before I take this medication? They need to know if you have any of these conditions: Eye disease, vision problems Hearing problems Kidney disease Low blood counts, such as low white cells, platelets, or red blood cells Tingling of the fingers or toes, or other nerve disorder An unusual or allergic reaction to cisplatin, carboplatin, oxaliplatin, other   medications, foods, dyes, or preservatives If you or your partner are pregnant or trying to get pregnant Breast-feeding How should I use this medication? This medication is injected into a vein. It is given by your care team in a hospital or clinic setting. Talk to  your care team about the use of this medication in children. Special care may be needed. Overdosage: If you think you have taken too much of this medicine contact a poison control center or emergency room at once. NOTE: This medicine is only for you. Do not share this medicine with others. What if I miss a dose? Keep appointments for follow-up doses. It is important not to miss your dose. Call your care team if you are unable to keep an appointment. What may interact with this medication? Do not take this medication with any of the following: Live virus vaccines This medication may also interact with the following: Certain antibiotics, such as amikacin, gentamicin, neomycin, polymyxin B, streptomycin, tobramycin, vancomycin Foscarnet This list may not describe all possible interactions. Give your health care provider a list of all the medicines, herbs, non-prescription drugs, or dietary supplements you use. Also tell them if you smoke, drink alcohol, or use illegal drugs. Some items may interact with your medicine. What should I watch for while using this medication? Your condition will be monitored carefully while you are receiving this medication. You may need blood work done while taking this medication. This medication may make you feel generally unwell. This is not uncommon, as chemotherapy can affect healthy cells as well as cancer cells. Report any side effects. Continue your course of treatment even though you feel ill unless your care team tells you to stop. This medication may increase your risk of getting an infection. Call your care team for advice if you get a fever, chills, sore throat, or other symptoms of a cold or flu. Do not treat yourself. Try to avoid being around people who are sick. Avoid taking medications that contain aspirin, acetaminophen, ibuprofen, naproxen, or ketoprofen unless instructed by your care team. These medications may hide a fever. This medication may increase  your risk to bruise or bleed. Call your care team if you notice any unusual bleeding. Be careful brushing or flossing your teeth or using a toothpick because you may get an infection or bleed more easily. If you have any dental work done, tell your dentist you are receiving this medication. Drink fluids as directed while you are taking this medication. This will help protect your kidneys. Call your care team if you get diarrhea. Do not treat yourself. Talk to your care team if you or your partner wish to become pregnant or think you might be pregnant. This medication can cause serious birth defects if taken during pregnancy and for 14 months after the last dose. A negative pregnancy test is required before starting this medication. A reliable form of contraception is recommended while taking this medication and for 14 months after the last dose. Talk to your care team about effective forms of contraception. Do not father a child while taking this medication and for 11 months after the last dose. Use a condom during sex during this time period. Do not breast-feed while taking this medication. This medication may cause infertility. Talk to your care team if you are concerned about your fertility. What side effects may I notice from receiving this medication? Side effects that you should report to your care team as soon as possible: Allergic reactions--skin rash,   itching, hives, swelling of the face, lips, tongue, or throat Eye pain, change in vision, vision loss Hearing loss, ringing in ears Infection--fever, chills, cough, sore throat, wounds that don't heal, pain or trouble when passing urine, general feeling of discomfort or being unwell Kidney injury--decrease in the amount of urine, swelling of the ankles, hands, or feet Low red blood cell level--unusual weakness or fatigue, dizziness, headache, trouble breathing Painful swelling, warmth, or redness of the skin, blisters or sores at the infusion  site Pain, tingling, or numbness in the hands or feet Unusual bruising or bleeding Side effects that usually do not require medical attention (report to your care team if they continue or are bothersome): Hair loss Nausea Vomiting This list may not describe all possible side effects. Call your doctor for medical advice about side effects. You may report side effects to FDA at 1-800-FDA-1088. Where should I keep my medication? This medication is given in a hospital or clinic. It will not be stored at home. NOTE: This sheet is a summary. It may not cover all possible information. If you have questions about this medicine, talk to your doctor, pharmacist, or health care provider.  2023 Elsevier/Gold Standard (2022-01-07 00:00:00)     

## 2022-10-21 NOTE — Progress Notes (Signed)
Per Dr Grayland Ormond, okay to proceed with only 150cc urine output.    Okay to run post hydration fluid with cisplatin

## 2022-10-21 NOTE — Progress Notes (Signed)
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  INTERVAL HISTORY: Patient returns to clinic today for further evaluation and reconsideration of her seventh and final cycle of weekly cisplatin.  She continues to have a rash consistent with a contact dermatitis surrounding her port, but otherwise feels well.  She has decreased appetite secondary to poor taste. She does not complain of pain today.  She has no neurologic complaints.  She denies any recent fevers or illnesses.  She has no chest pain, shortness of breath, cough, or hemoptysis.  She denies any nausea, vomiting, constipation, or diarrhea.  She has no urinary complaints.  Patient offers no further specific complaints today.  REVIEW OF SYSTEMS:   Review of Systems  Constitutional: Negative.  Negative for fever, malaise/fatigue and weight loss.  HENT:  Negative for sore throat.   Respiratory: Negative.  Negative for cough, hemoptysis and shortness of breath.   Cardiovascular: Negative.  Negative for chest pain and leg swelling.  Gastrointestinal: Negative.  Negative for abdominal pain.  Genitourinary: Negative.  Negative for dysuria.  Musculoskeletal:  Negative for neck pain.  Skin: Negative.  Negative for rash.  Neurological: Negative.  Negative for dizziness, focal weakness, weakness and headaches.  Psychiatric/Behavioral: Negative.  The patient is not nervous/anxious.     As per HPI. Otherwise, a complete review of systems is negative.  PAST MEDICAL HISTORY: Past Medical History:  Diagnosis Date   Depression    Difficult intubation    Endometriosis    Tongue cancer ([place])     PAST SURGICAL HISTORY: Past  Surgical History:  Procedure Laterality Date   ABDOMINAL HYSTERECTOMY  11/20/2018   [facility]   APPENDECTOMY  11/20/2018   [facility]   COLON RESECTION     DIRECT LARYNGOSCOPY N/A 07/21/2022   Procedure: DIRECT MICROLARYNGOSCOPY;  Surgeon: Margaretha Sheffield, MD;  Location: [place];  Service: ENT;  Laterality: N/A;   IR IMAGING GUIDED PORT INSERTION  09/29/2022   LAPAROSCOPY  01/25/2018   Procedure: LAPAROSCOPY DIAGNOSTIC;  Surgeon: Will Bonnet, MD;  Location: [facility];  Service: Gynecology;;   TONGUE BIOPSY N/A 07/21/2022   Procedure: TONGUE BASE BIOPSY;  Surgeon: Margaretha Sheffield, MD;  Location: [place];  Service: ENT;  Laterality: N/A;    FAMILY HISTORY: Family History  Problem Relation Age of Onset   Cancer Maternal Uncle     ADVANCED DIRECTIVES (Y/N):  N  HEALTH MAINTENANCE: Social History   Tobacco Use   Smoking status: Former    Packs/day: 0.75    Years: 30.00    Total pack years: 22.50    Types: Cigarettes    Quit date: 07/05/2022    Years since quitting: 0.2   Smokeless tobacco: Never   Tobacco comments:       Vaping Use   Vaping Use: Never used  Substance Use Topics   Alcohol use: No   Drug use: No     Colonoscopy:  PAP:  Bone density:  Lipid panel:  Allergies  Allergen Reactions   Chlorhexidine Gluconate Rash    Current Outpatient Medications  Medication Sig Dispense Refill   HYDROcodone-acetaminophen (NORCO) 5-325 MG tablet Take 1 tablet by mouth every 6 (six) hours as needed for  moderate pain. 60 tablet 0   lidocaine-prilocaine (EMLA) cream Apply 1 Application topically as needed. 30 g 0   ondansetron (ZOFRAN) 8 MG tablet Take 1 tablet (8 mg total) by mouth every 8 (eight) hours as needed for nausea or vomiting. Start on the third day after cisplatin. 60 tablet 1   predniSONE (DELTASONE) 20 MG tablet Take 1 tablet (20 mg total) by mouth daily with breakfast for 5 days. 5 tablet 0   prochlorperazine (COMPAZINE) 10 MG tablet Take 1  tablet (10 mg total) by mouth every 6 (six) hours as needed (Nausea or vomiting). 60 tablet 1   sucralfate (CARAFATE) 1 g tablet Take 1 tablet (1 g total) by mouth 4 (four) times daily -  with meals and at bedtime. Dissolve tablet in 3 to 4 tablespoons of warm. 90 tablet 1   traMADol (ULTRAM) 50 MG tablet Take 1 tablet (50 mg total) by mouth every 6 (six) hours as needed. 30 tablet 0   triamcinolone cream (KENALOG) 0.1 % Apply 1 Application topically 2 (two) times daily. To skin irritation of right upper chest 80 g 0   naproxen (NAPROSYN) 500 MG tablet Take 1 tablet (500 mg total) by mouth 2 (two) times daily with a meal. (Patient not taking: Reported on 10/21/2022) 10 tablet 2   No current facility-administered medications for this visit.    OBJECTIVE: Vitals:   10/21/22 0844  BP: 102/71  Pulse: 77  Resp: 16  Temp: (!) 96.9 F (36.1 C)  SpO2: 100%     Body mass index is 21.05 kg/m.    ECOG FS:0 - Asymptomatic  General: Well-developed, well-nourished, no acute distress. Eyes: Pink conjunctiva, anicteric sclera. HEENT: Normocephalic, moist mucous membranes. Lungs: No audible wheezing or coughing. Heart: Regular rate and rhythm. Abdomen: Soft, nontender, no obvious distention. Musculoskeletal: No edema, cyanosis, or clubbing. Neuro: Alert, answering all questions appropriately. Cranial nerves grossly intact. Skin: No rashes or petechiae noted.  Erythematous rash surrounding port area. Psych: Normal affect.   LAB RESULTS:  Lab Results  Component Value Date   NA 135 10/20/2022   K 3.9 10/20/2022   CL 98 10/20/2022   CO2 25 10/20/2022   GLUCOSE 129 (H) 10/20/2022   BUN 15 10/20/2022   CREATININE 0.78 10/20/2022   CALCIUM 9.6 10/20/2022   PROT 8.2 (H) 07/04/2022   ALBUMIN 4.9 07/04/2022   AST 18 07/04/2022   ALT 14 07/04/2022   ALKPHOS 82 07/04/2022   BILITOT 0.6 07/04/2022   GFRNONAA >60 10/20/2022   GFRAA >60 01/15/2018    Lab Results  Component Value Date   WBC  5.4 10/20/2022   NEUTROABS 4.9 10/20/2022   HGB 12.2 10/20/2022   HCT 36.5 10/20/2022   MCV 93.6 10/20/2022   PLT 115 (L) 10/20/2022     STUDIES: IR IMAGING GUIDED PORT INSERTION  Result Date: 09/29/2022 INDICATION: Head and neck cancer. EXAM: IMPLANTED PORT A CATH PLACEMENT WITH ULTRASOUND AND FLUOROSCOPIC GUIDANCE MEDICATIONS: None ANESTHESIA/SEDATION: Moderate (conscious) sedation was employed during this procedure. A total of Versed 2 mg and Fentanyl 100 mcg was administered intravenously. Moderate Sedation Time: 15 minutes. The patient's level of consciousness and vital signs were monitored continuously by radiology nursing throughout the procedure under my direct supervision. FLUOROSCOPY TIME:  Fluoroscopic dose; 2.5 mGy COMPLICATIONS: None immediate. PROCEDURE: The procedure, risks, benefits, and alternatives were explained to the patient. Questions regarding the procedure were encouraged and answered. The patient understands and consents to the procedure. The RIGHT neck and chest  were prepped with chlorhexidine in a sterile fashion, and a sterile drape was applied covering the operative field. Maximum barrier sterile technique with sterile gowns and gloves were used for the procedure. A timeout was performed prior to the initiation of the procedure. Local anesthesia was provided with 1% lidocaine with epinephrine. After creating a small venotomy incision, a micropuncture kit was utilized to access the internal jugular vein under direct, real-time ultrasound guidance. Ultrasound image documentation was performed. The microwire was kinked to measure appropriate catheter length. A subcutaneous port pocket was then created along the upper chest wall utilizing a combination of sharp and blunt dissection. The pocket was irrigated with sterile saline. A single lumen Non-ISP power injectable port was chosen for placement. The 8 Fr catheter was tunneled from the port pocket site to the venotomy incision.  The port was placed in the pocket. The external catheter was trimmed to appropriate length. At the venotomy, an 8 Fr peel-away sheath was placed over a guidewire under fluoroscopic guidance. The catheter was then placed through the sheath and the sheath was removed. Final catheter positioning was confirmed and documented with a fluoroscopic spot radiograph. The port was accessed with a Huber needle, aspirated and flushed with heparinized saline. The port pocket incision was closed with interrupted 3-0 Vicryl suture then Dermabond was applied, including at the venotomy incision. Dressings were placed. The patient tolerated the procedure well without immediate post procedural complication. IMPRESSION: Successful placement of a RIGHT internal jugular approach power injectable Port-A-Cath. The tip of the catheter is positioned within the proximal RIGHT atrium. The catheter is ready for immediate use. Michaelle Birks, MD [facility] Electronically Signed   By: Michaelle Birks M.D.   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  PET scan results from August 10, 2022 reviewed independently with hypermetabolic left base of tongue mass and minimally metabolic lymph node unclear if this is a metastasis or reactive.  Patient will benefit from concurrent chemotherapy using weekly cisplatin 40 mg/m2 with daily XRT.  Patient has now had port placement.  Proceed with her seventh and final cycle today.  Patient will complete XRT next week.  No further chemotherapy is necessary.  Return to clinic in 4 weeks with repeat laboratory work and further evaluation.     Pain: Patient does not complain of pain today.  Continue tramadol as  needed.  She also has Norco as needed for nighttime use. IV access: Patient now has a port. Dysphagia: Patient does not have this today.  Continue Carafate as prescribed. Leukopenia: Resolved. Thrombocytopenia: Platelets improved to 115.  Proceed with treatment as above. Rash: Appears to be a contact dermatitis.  Continue prednisone taper as prescribed.   Patient expressed understanding and was in agreement with this plan. She also understands that She can call clinic at any time with any questions, concerns, or complaints.    Cancer Staging  Tongue cancer [facility]) Staging form: Oral Cavity, AJCC 8th Edition - Clinical stage from 08/05/2022: Stage III (cT3, cN1, cM0) - Signed by Lloyd Huger, MD on 08/05/2022 Stage prefix: Initial diagnosis   Lloyd Huger, MD   10/21/2022 9:19 AM

## 2022-10-23 ENCOUNTER — Other Ambulatory Visit: Payer: Self-pay

## 2022-10-24 ENCOUNTER — Other Ambulatory Visit: Payer: Self-pay

## 2022-10-24 ENCOUNTER — Emergency Department
Admission: EM | Admit: 2022-10-24 | Discharge: 2022-10-24 | Disposition: A | Discharge status: Home / Self Care | Payer: BC Managed Care – PPO | Attending: Emergency Medicine | Admitting: Emergency Medicine

## 2022-10-24 ENCOUNTER — Ambulatory Visit: Admission: RE | Admit: 2022-10-24 | Payer: BC Managed Care – PPO | Source: Ambulatory Visit

## 2022-10-24 ENCOUNTER — Encounter: Payer: Self-pay | Admitting: Emergency Medicine

## 2022-10-24 ENCOUNTER — Ambulatory Visit: Payer: BC Managed Care – PPO

## 2022-10-24 DIAGNOSIS — Z85810 Personal history of malignant neoplasm of tongue: Secondary | ICD-10-CM | POA: Diagnosis not present

## 2022-10-24 DIAGNOSIS — Z1152 Encounter for screening for COVID-19: Secondary | ICD-10-CM | POA: Diagnosis not present

## 2022-10-24 DIAGNOSIS — E860 Dehydration: Secondary | ICD-10-CM

## 2022-10-24 DIAGNOSIS — R42 Dizziness and giddiness: Secondary | ICD-10-CM | POA: Insufficient documentation

## 2022-10-24 DIAGNOSIS — R531 Weakness: Secondary | ICD-10-CM

## 2022-10-24 LAB — URINALYSIS, ROUTINE W REFLEX MICROSCOPIC
Glucose, UA: NEGATIVE mg/dL
Hgb urine dipstick: NEGATIVE
Ketones, ur: 20 mg/dL — AB
Leukocytes,Ua: NEGATIVE
Nitrite: NEGATIVE
Protein, ur: >=300 mg/dL — AB
Specific Gravity, Urine: 1.028 (ref 1.005–1.030)
pH: 5 (ref 5.0–8.0)

## 2022-10-24 LAB — CBC
HCT: 34.6 % — ABNORMAL LOW (ref 36.0–46.0)
Hemoglobin: 11.8 g/dL — ABNORMAL LOW (ref 12.0–15.0)
MCH: 31.1 pg (ref 26.0–34.0)
MCHC: 34.1 g/dL (ref 30.0–36.0)
MCV: 91.3 fL (ref 80.0–100.0)
Platelets: 114 10*3/uL — ABNORMAL LOW (ref 150–400)
RBC: 3.79 MIL/uL — ABNORMAL LOW (ref 3.87–5.11)
RDW: 14.9 % (ref 11.5–15.5)
WBC: 3.7 10*3/uL — ABNORMAL LOW (ref 4.0–10.5)
nRBC: 0 % (ref 0.0–0.2)

## 2022-10-24 LAB — BASIC METABOLIC PANEL
Anion gap: 13 (ref 5–15)
BUN: 19 mg/dL (ref 6–20)
CO2: 27 mmol/L (ref 22–32)
Calcium: 9.4 mg/dL (ref 8.9–10.3)
Chloride: 96 mmol/L — ABNORMAL LOW (ref 98–111)
Creatinine, Ser: 0.74 mg/dL (ref 0.44–1.00)
GFR, Estimated: >60 mL/min (ref >60)
Glucose, Bld: 94 mg/dL (ref 70–99)
Potassium: 3.7 mmol/L (ref 3.5–5.1)
Sodium: 136 mmol/L (ref 135–145)

## 2022-10-24 LAB — RESP PANEL BY RT-PCR (RSV, FLU A&B, COVID)  RVPGX2
Influenza A by PCR: NEGATIVE
Influenza B by PCR: NEGATIVE
Resp Syncytial Virus by PCR: NEGATIVE
SARS Coronavirus 2 by RT PCR: NEGATIVE

## 2022-10-24 LAB — POC URINE PREG, ED: Preg Test, Ur: NEGATIVE

## 2022-10-24 MED ORDER — LIDOCAINE-PRILOCAINE 2.5-2.5 % EX CREA
TOPICAL_CREAM | Freq: Once | CUTANEOUS | Status: AC
  Filled 2022-10-24: qty 5

## 2022-10-24 MED ORDER — HEPARIN SOD (PORK) LOCK FLUSH 10 UNIT/ML IV SOLN
10.0000 [IU] | Freq: Once | INTRAVENOUS | Status: AC
  Administered 2022-10-24: 10 [IU]

## 2022-10-24 MED ORDER — LACTATED RINGERS IV BOLUS
1000.0000 mL | Freq: Once | INTRAVENOUS | Status: AC
  Administered 2022-10-24: 1000 mL via INTRAVENOUS

## 2022-10-24 NOTE — ED Triage Notes (Signed)
Pt presents to the ED via POV due to dizziness. Pt states she may have overexerted herself by bringing in groceries. Pt states she is a cx patient and is receiving radiation. Pt states she has a poor appetite. Pt states the dizziness and the flush feeling is intermittent. Pt A&Ox4

## 2022-10-24 NOTE — ED Notes (Signed)
E signature pad not working. Pt educated on discharge instructions and verbalized understanding.  

## 2022-10-24 NOTE — Discharge Instructions (Signed)
As we discussed please drink plenty of fluids and increase your caloric intake.  Please follow-up with your doctor within the next several days for recheck/reevaluation.  Return to the emergency department for any further episodes of dizziness lightheadedness or any other symptom personally concerning to yourself.

## 2022-10-24 NOTE — ED Provider Notes (Signed)
[facility] Provider Note    Event Date/Time   First MD Initiated Contact with Patient 10/24/22 1859     (approximate)  History   Chief Complaint: Dizziness  HPI  Holly Vega is a 47 y.o. female with a past medical history of tongue cancer currently on chemotherapy and radiation who presents to the emergency department for an episode of dizziness/lightheadedness.  According to the patient around 330 or so she developed acute onset of dizziness lightheadedness and became sweaty.  Husband states he believes it is because she has not been eating or drinking anything.  States that starting radiation and chemotherapy her appetite has been very low and she has not been taking in many fluids.  Patient states she had her last chemotherapy this past Friday but has 6 more radiation treatments to go.  Patient denies any fever denies any chest pain, abdominal pain, cough congestion.  Physical Exam   Triage Vital Signs: ED Triage Vitals  Enc Vitals Group     BP 10/24/22 1807 108/75     Pulse Rate 10/24/22 1807 79     Resp 10/24/22 1807 16     Temp 10/24/22 1807 98 F (36.7 C)     Temp Source 10/24/22 1807 Oral     SpO2 10/24/22 1807 99 %     Weight 10/24/22 1747 134 lb 4.2 oz (60.9 kg)     Height 10/24/22 1747 '5\' 7"'$  (1.702 m)     Head Circumference --      Peak Flow --      Pain Score 10/24/22 1809 0     Pain Loc --      Pain Edu? --      Excl. in [place]? --     Most recent vital signs: Vitals:   10/24/22 1807 10/24/22 1839  BP: 108/75 115/69  Pulse: 79 72  Resp: 16 16  Temp: 98 F (36.7 C)   SpO2: 99% 100%    General: Awake, no distress.  CV:  Good peripheral perfusion.  Regular rate and rhythm  Resp:  Normal effort.  Equal breath sounds bilaterally.  Abd:  No distention.  Soft, nontender.  No rebound or guarding.  ED Results / Procedures / Treatments   EKG  EKG viewed and interpreted by myself shows a normal sinus rhythm 88 bpm with a narrow QRS,  normal axis, normal intervals, no concerning ST changes.  MEDICATIONS ORDERED IN ED: Medications  lidocaine-prilocaine (EMLA) cream (has no administration in time range)  lactated ringers bolus 1,000 mL (has no administration in time range)     IMPRESSION / MDM / ASSESSMENT AND PLAN / ED COURSE  I reviewed the triage vital signs and the nursing notes.  Patient's presentation is most consistent with acute presentation with potential threat to life or bodily function.  Patient presents emergency department for episode of generalized weakness that occurred several hours ago.  Patient states she is feeling somewhat better now.  Husband strongly believes that the patient is dehydrated.  Patient's urinalysis does show ketones which could reflect dehydration.  CBC shows no concerning finding.  Chemistry is pending.  EKG is reassuring.  Given the patient's episode we will add on a cardiac enzyme as a precaution.  We will check for COVID/flu/RSV.  Will IV hydrate and continue to closely monitor.  Patient's labs have resulted showing overall reassuring CBC, chemistry shows mild signs of dehydration like an anion gap of 13.  Patient's urine shows no significant finding,  pregnancy test is negative.  COVID/flu/RSV is negative.  Patient states she is feeling much better after IV fluids.  Suspect a degree of dehydration leading to the patient's symptoms today.  Discussed with the patient increasing fluid intake as well as dietary intake.  Patient agreeable to plan.  Will discharge with PCP follow-up.  FINAL CLINICAL IMPRESSION(S) / ED DIAGNOSES   Weakness Dehydration  Note:  This document was prepared using Dragon voice recognition software and may include unintentional dictation errors.   Harvest Dark, MD 10/24/22 2212

## 2022-10-25 ENCOUNTER — Ambulatory Visit: Payer: BC Managed Care – PPO

## 2022-10-25 ENCOUNTER — Other Ambulatory Visit: Payer: Self-pay

## 2022-10-25 ENCOUNTER — Encounter: Payer: Self-pay | Admitting: Oncology

## 2022-10-26 ENCOUNTER — Other Ambulatory Visit: Payer: Self-pay

## 2022-10-26 ENCOUNTER — Ambulatory Visit: Payer: BC Managed Care – PPO

## 2022-10-26 ENCOUNTER — Ambulatory Visit
Admission: RE | Admit: 2022-10-26 | Discharge: 2022-10-26 | Disposition: A | Discharge status: Home / Self Care | Payer: BC Managed Care – PPO | Source: Ambulatory Visit | Attending: Radiation Oncology | Admitting: Radiation Oncology

## 2022-10-26 DIAGNOSIS — Z510 Encounter for antineoplastic radiation therapy: Secondary | ICD-10-CM | POA: Diagnosis not present

## 2022-10-26 LAB — RAD ONC ARIA SESSION SUMMARY
Course Elapsed Days: 51
Plan Fractions Treated to Date: 30
Plan Prescribed Dose Per Fraction: 2 Gy
Plan Total Fractions Prescribed: 35
Plan Total Prescribed Dose: 70 Gy
Reference Point Dosage Given to Date: 60 Gy
Reference Point Session Dosage Given: 2 Gy
Session Number: 30

## 2022-10-27 ENCOUNTER — Ambulatory Visit
Admission: RE | Admit: 2022-10-27 | Discharge: 2022-10-27 | Disposition: A | Discharge status: Home / Self Care | Payer: BC Managed Care – PPO | Source: Ambulatory Visit | Attending: Radiation Oncology | Admitting: Radiation Oncology

## 2022-10-27 ENCOUNTER — Inpatient Hospital Stay (HOSPITAL_BASED_OUTPATIENT_CLINIC_OR_DEPARTMENT_OTHER): Payer: BC Managed Care – PPO | Admitting: Nurse Practitioner

## 2022-10-27 ENCOUNTER — Inpatient Hospital Stay: Payer: BC Managed Care – PPO

## 2022-10-27 ENCOUNTER — Encounter: Payer: Self-pay | Admitting: Nurse Practitioner

## 2022-10-27 ENCOUNTER — Ambulatory Visit: Payer: BC Managed Care – PPO

## 2022-10-27 ENCOUNTER — Inpatient Hospital Stay: Payer: BC Managed Care – PPO | Attending: Oncology

## 2022-10-27 ENCOUNTER — Other Ambulatory Visit: Payer: Self-pay

## 2022-10-27 VITALS — BP 98/79 | HR 81 | Temp 99.4°F | Wt 132.0 lb

## 2022-10-27 DIAGNOSIS — Z87891 Personal history of nicotine dependence: Secondary | ICD-10-CM | POA: Insufficient documentation

## 2022-10-27 DIAGNOSIS — Z79899 Other long term (current) drug therapy: Secondary | ICD-10-CM | POA: Diagnosis not present

## 2022-10-27 DIAGNOSIS — Z09 Encounter for follow-up examination after completed treatment for conditions other than malignant neoplasm: Secondary | ICD-10-CM

## 2022-10-27 DIAGNOSIS — D6959 Other secondary thrombocytopenia: Secondary | ICD-10-CM

## 2022-10-27 DIAGNOSIS — Z510 Encounter for antineoplastic radiation therapy: Secondary | ICD-10-CM | POA: Insufficient documentation

## 2022-10-27 DIAGNOSIS — L282 Other prurigo: Secondary | ICD-10-CM

## 2022-10-27 DIAGNOSIS — T451X5A Adverse effect of antineoplastic and immunosuppressive drugs, initial encounter: Secondary | ICD-10-CM

## 2022-10-27 DIAGNOSIS — D701 Agranulocytosis secondary to cancer chemotherapy: Secondary | ICD-10-CM | POA: Diagnosis not present

## 2022-10-27 DIAGNOSIS — D696 Thrombocytopenia, unspecified: Secondary | ICD-10-CM | POA: Insufficient documentation

## 2022-10-27 DIAGNOSIS — C029 Malignant neoplasm of tongue, unspecified: Secondary | ICD-10-CM

## 2022-10-27 DIAGNOSIS — D72819 Decreased white blood cell count, unspecified: Secondary | ICD-10-CM | POA: Insufficient documentation

## 2022-10-27 DIAGNOSIS — Z5189 Encounter for other specified aftercare: Secondary | ICD-10-CM

## 2022-10-27 DIAGNOSIS — C01 Malignant neoplasm of base of tongue: Secondary | ICD-10-CM | POA: Insufficient documentation

## 2022-10-27 DIAGNOSIS — R634 Abnormal weight loss: Secondary | ICD-10-CM

## 2022-10-27 LAB — CBC WITH DIFFERENTIAL/PLATELET
Abs Immature Granulocytes: 0 10*3/uL (ref 0.00–0.07)
Basophils Absolute: 0 10*3/uL (ref 0.0–0.1)
Basophils Relative: 1 %
Eosinophils Absolute: 0.1 10*3/uL (ref 0.0–0.5)
Eosinophils Relative: 4 %
HCT: 32.6 % — ABNORMAL LOW (ref 36.0–46.0)
Hemoglobin: 11.3 g/dL — ABNORMAL LOW (ref 12.0–15.0)
Immature Granulocytes: 0 %
Lymphocytes Relative: 12 %
Lymphs Abs: 0.3 10*3/uL — ABNORMAL LOW (ref 0.7–4.0)
MCH: 31.3 pg (ref 26.0–34.0)
MCHC: 34.7 g/dL (ref 30.0–36.0)
MCV: 90.3 fL (ref 80.0–100.0)
Monocytes Absolute: 0.2 10*3/uL (ref 0.1–1.0)
Monocytes Relative: 8 %
Neutro Abs: 1.6 10*3/uL — ABNORMAL LOW (ref 1.7–7.7)
Neutrophils Relative %: 75 %
Platelets: 100 10*3/uL — ABNORMAL LOW (ref 150–400)
RBC: 3.61 MIL/uL — ABNORMAL LOW (ref 3.87–5.11)
RDW: 14.6 % (ref 11.5–15.5)
WBC: 2.2 10*3/uL — ABNORMAL LOW (ref 4.0–10.5)
nRBC: 0 % (ref 0.0–0.2)

## 2022-10-27 LAB — RAD ONC ARIA SESSION SUMMARY
Course Elapsed Days: 52
Plan Fractions Treated to Date: 31
Plan Prescribed Dose Per Fraction: 2 Gy
Plan Total Fractions Prescribed: 35
Plan Total Prescribed Dose: 70 Gy
Reference Point Dosage Given to Date: 62 Gy
Reference Point Session Dosage Given: 2 Gy
Session Number: 31

## 2022-10-27 LAB — COMPREHENSIVE METABOLIC PANEL
ALT: 12 U/L (ref 0–44)
AST: 19 U/L (ref 15–41)
Albumin: 4.2 g/dL (ref 3.5–5.0)
Alkaline Phosphatase: 59 U/L (ref 38–126)
Anion gap: 12 (ref 5–15)
BUN: 12 mg/dL (ref 6–20)
CO2: 26 mmol/L (ref 22–32)
Calcium: 9 mg/dL (ref 8.9–10.3)
Chloride: 100 mmol/L (ref 98–111)
Creatinine, Ser: 0.63 mg/dL (ref 0.44–1.00)
GFR, Estimated: >60 mL/min (ref >60)
Glucose, Bld: 102 mg/dL — ABNORMAL HIGH (ref 70–99)
Potassium: 3.7 mmol/L (ref 3.5–5.1)
Sodium: 138 mmol/L (ref 135–145)
Total Bilirubin: 0.3 mg/dL (ref 0.3–1.2)
Total Protein: 6.9 g/dL (ref 6.5–8.1)

## 2022-10-27 MED ORDER — SODIUM CHLORIDE 0.9 % IV SOLN: 5.0000 mg | Freq: Once | INTRAVENOUS | Status: DC

## 2022-10-27 MED ORDER — PREDNISONE 20 MG PO TABS: 20.0000 mg | ORAL_TABLET | Freq: Every day | ORAL | 0 refills | Status: AC

## 2022-10-27 MED ORDER — HYDROXYZINE HCL 10 MG PO TABS: 10.0000 mg | ORAL_TABLET | Freq: Three times a day (TID) | ORAL | 0 refills | Status: DC | PRN

## 2022-10-27 MED ORDER — DEXAMETHASONE SODIUM PHOSPHATE 10 MG/ML IJ SOLN
5.0000 mg | Freq: Once | INTRAMUSCULAR | Status: AC
  Administered 2022-10-27: 5 mg via INTRAVENOUS
  Filled 2022-10-27: qty 1

## 2022-10-27 MED ORDER — SODIUM CHLORIDE 0.9% FLUSH
10.0000 mL | Freq: Once | INTRAVENOUS | Status: DC
  Filled 2022-10-27: qty 10

## 2022-10-27 MED ORDER — HEPARIN SOD (PORK) LOCK FLUSH 100 UNIT/ML IV SOLN
500.0000 [IU] | Freq: Once | INTRAVENOUS | Status: AC
  Administered 2022-10-27: 500 [IU] via INTRAVENOUS
  Filled 2022-10-27: qty 5

## 2022-10-27 MED ORDER — SODIUM CHLORIDE 0.9 % IV SOLN
Freq: Once | INTRAVENOUS | Status: AC
  Filled 2022-10-27: qty 250

## 2022-10-27 NOTE — Patient Instructions (Signed)
Your blood counts are a bit low today post chemotherapy. While they are recovering, please monitor your temperature and notify the clinic if you develop fever greater than 100.5 OR your symptoms worsen.

## 2022-10-27 NOTE — Progress Notes (Signed)
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  INTERVAL HISTORY: Patient returns to clinic today for further evaluation and followup after completing chemotherapy. She continues to have rash of the chest which is somewhat worse. She is tired but otherwise feels well. Some congestion but no fever or chills. Has taste changes which are unchanged. Complains of itching which keeps her up at night. Continues to use topical steroid cream which improved her symptoms somewhat.   REVIEW OF SYSTEMS:   Review of Systems  Constitutional:  Positive for malaise/fatigue. Negative for fever and weight loss.  HENT:  Positive for congestion. Negative for sore throat.   Respiratory: Negative.  Negative for cough, hemoptysis and shortness of breath.   Cardiovascular: Negative.  Negative for chest pain and leg swelling.  Gastrointestinal:  Negative for abdominal pain, constipation, diarrhea and nausea.  Genitourinary:  Negative for dysuria.  Musculoskeletal:  Negative for falls and neck pain.  Skin:  Positive for itching and rash.  Neurological: Negative.  Negative for dizziness, focal weakness, weakness and headaches.  Psychiatric/Behavioral: Negative.  The patient is not nervous/anxious.   As per HPI. Otherwise, a complete review of systems is negative.  PAST MEDICAL HISTORY: Past Medical History:  Diagnosis Date   Depression    Difficult intubation    Endometriosis    Tongue cancer ([place])     PAST SURGICAL HISTORY: Past Surgical History:  Procedure Laterality Date   ABDOMINAL HYSTERECTOMY  11/20/2018   [facility]   APPENDECTOMY  11/20/2018   [facility]   COLON RESECTION     DIRECT  LARYNGOSCOPY N/A 07/21/2022   Procedure: DIRECT MICROLARYNGOSCOPY;  Surgeon: Margaretha Sheffield, MD;  Location: [place];  Service: ENT;  Laterality: N/A;   IR IMAGING GUIDED PORT INSERTION  09/29/2022   LAPAROSCOPY  01/25/2018   Procedure: LAPAROSCOPY DIAGNOSTIC;  Surgeon: Will Bonnet, MD;  Location: [facility];  Service: Gynecology;;   TONGUE BIOPSY N/A 07/21/2022   Procedure: TONGUE BASE BIOPSY;  Surgeon: Margaretha Sheffield, MD;  Location: [facility];  Service: ENT;  Laterality: N/A;    FAMILY HISTORY: Family History  Problem Relation Age of Onset   Cancer Maternal Uncle     ADVANCED DIRECTIVES (Y/N):  N  HEALTH MAINTENANCE: Social History   Tobacco Use   Smoking status: Former    Packs/day: 0.75    Years: 30.00    Total pack years: 22.50    Types: Cigarettes    Quit date: 07/05/2022    Years since quitting: 0.3   Smokeless tobacco: Never   Tobacco comments:       Vaping Use   Vaping Use: Never used  Substance Use Topics   Alcohol use: No   Drug use: No     Colonoscopy:  PAP:  Bone density:  Lipid panel:  Allergies  Allergen Reactions   Chlorhexidine Gluconate Rash    Current Outpatient Medications  Medication Sig Dispense Refill   HYDROcodone-acetaminophen (NORCO) 5-325 MG tablet Take 1 tablet by mouth every 6 (six) hours as needed for moderate pain. 60 tablet 0   lidocaine-prilocaine (EMLA) cream Apply 1 Application topically as needed. 30 g 0   ondansetron (ZOFRAN) 8 MG tablet Take 1 tablet (8  mg total) by mouth every 8 (eight) hours as needed for nausea or vomiting. Start on the third day after cisplatin. 60 tablet 1   prochlorperazine (COMPAZINE) 10 MG tablet Take 1 tablet (10 mg total) by mouth every 6 (six) hours as needed (Nausea or vomiting). 60 tablet 1   sucralfate (CARAFATE) 1 g tablet Take 1 tablet (1 g total) by mouth 4 (four) times daily -  with meals and at bedtime. Dissolve tablet in 3 to 4 tablespoons of warm. 90 tablet 1    traMADol (ULTRAM) 50 MG tablet Take 1 tablet (50 mg total) by mouth every 6 (six) hours as needed. 30 tablet 0   triamcinolone cream (KENALOG) 0.1 % Apply 1 Application topically 2 (two) times daily. To skin irritation of right upper chest 80 g 0   naproxen (NAPROSYN) 500 MG tablet Take 1 tablet (500 mg total) by mouth 2 (two) times daily with a meal. (Patient not taking: Reported on 10/21/2022) 10 tablet 2   No current facility-administered medications for this visit.    OBJECTIVE: Vitals:   10/27/22 1030  BP: 98/79  Pulse: 81  Temp: 99.4 F (37.4 C)     Body mass index is 20.67 kg/m.    ECOG FS:1 - Symptomatic but completely ambulatory  General: Well-developed, well-nourished, no acute distress. Eyes: Pink conjunctiva, anicteric sclera. HEENT: Normocephalic, moist mucous membranes. Lungs: No audible wheezing or coughing. Heart: Regular rate and rhythm. Abdomen: Soft, nontender, no obvious distention. Musculoskeletal: No edema, cyanosis, or clubbing. Neuro: Alert, answering all questions appropriately. Cranial nerves grossly intact. Skin: papular erythema of right chest, now extending down breast. Confluent around port site.  Psych: Normal affect.   LAB RESULTS: Lab Results  Component Value Date   NA 138 10/27/2022   K 3.7 10/27/2022   CL 100 10/27/2022   CO2 26 10/27/2022   GLUCOSE 102 (H) 10/27/2022   BUN 12 10/27/2022   CREATININE 0.63 10/27/2022   CALCIUM 9.0 10/27/2022   PROT 6.9 10/27/2022   ALBUMIN 4.2 10/27/2022   AST 19 10/27/2022   ALT 12 10/27/2022   ALKPHOS 59 10/27/2022   BILITOT 0.3 10/27/2022   GFRNONAA >60 10/27/2022   GFRAA >60 01/15/2018    Lab Results  Component Value Date   WBC 2.2 (L) 10/27/2022   NEUTROABS 1.6 (L) 10/27/2022   HGB 11.3 (L) 10/27/2022   HCT 32.6 (L) 10/27/2022   MCV 90.3 10/27/2022   PLT 100 (L) 10/27/2022    STUDIES: IR IMAGING GUIDED PORT INSERTION  Result Date: 09/29/2022 INDICATION: Head and neck cancer. EXAM:  IMPLANTED PORT A CATH PLACEMENT WITH ULTRASOUND AND FLUOROSCOPIC GUIDANCE MEDICATIONS: None ANESTHESIA/SEDATION: Moderate (conscious) sedation was employed during this procedure. A total of Versed 2 mg and Fentanyl 100 mcg was administered intravenously. Moderate Sedation Time: 15 minutes. The patient's level of consciousness and vital signs were monitored continuously by radiology nursing throughout the procedure under my direct supervision. FLUOROSCOPY TIME:  Fluoroscopic dose; 2.5 mGy COMPLICATIONS: None immediate. PROCEDURE: The procedure, risks, benefits, and alternatives were explained to the patient. Questions regarding the procedure were encouraged and answered. The patient understands and consents to the procedure. The RIGHT neck and chest were prepped with chlorhexidine in a sterile fashion, and a sterile drape was applied covering the operative field. Maximum barrier sterile technique with sterile gowns and gloves were used for the procedure. A timeout was performed prior to the initiation of the procedure. Local anesthesia was provided with 1% lidocaine with epinephrine. After creating  a small venotomy incision, a micropuncture kit was utilized to access the internal jugular vein under direct, real-time ultrasound guidance. Ultrasound image documentation was performed. The microwire was kinked to measure appropriate catheter length. A subcutaneous port pocket was then created along the upper chest wall utilizing a combination of sharp and blunt dissection. The pocket was irrigated with sterile saline. A single lumen Non-ISP power injectable port was chosen for placement. The 8 Fr catheter was tunneled from the port pocket site to the venotomy incision. The port was placed in the pocket. The external catheter was trimmed to appropriate length. At the venotomy, an 8 Fr peel-away sheath was placed over a guidewire under fluoroscopic guidance. The catheter was then placed through the sheath and the sheath  was removed. Final catheter positioning was confirmed and documented with a fluoroscopic spot radiograph. The port was accessed with a Huber needle, aspirated and flushed with heparinized saline. The port pocket incision was closed with interrupted 3-0 Vicryl suture then Dermabond was applied, including at the venotomy incision. Dressings were placed. The patient tolerated the procedure well without immediate post procedural complication. IMPRESSION: Successful placement of a RIGHT internal jugular approach power injectable Port-A-Cath. The tip of the catheter is positioned within the proximal RIGHT atrium. The catheter is ready for immediate use. Michaelle Birks, MD [facility] Radiology Electronically Signed   By: Michaelle Birks M.D.   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 She has completed chemotherapy with cisplatin and has another week of radiation. IV fluids today.  Rash: Appears to be a contact dermatitis. Improved with calming soap, prednisone taper, and topical triamcinolone. Start hydroxyzine for itching. Plan for decadron 5 mg IV today. Can restart prednisone taper x 5 days.  Pain: Patient does not complain of pain today.  Continue tramadol as needed.  She also has Norco as needed for nighttime use. Dysphagia: Patient does not have this today.  Continue Carafate as prescribed. Leukopenia: ANC down to 1.6. Likely related to chemotherapy. Reviewed neutropenic precautions. If symptoms of URI worsen, notify clinic.  Thrombocytopenia: likely related to chemotherapy. Platelets 100. Proceed with treatment as above. Port-a-cath: accessed today. Functioning appropriately.   Disposition:  RTC as scheduled. RTC in interim if symptoms do not improve or worsen.   Patient expressed understanding and was in  agreement with this plan. She also understands that She can call clinic at any time with any questions, concerns, or complaints.    Cancer Staging  Tongue cancer [facility]) Staging form: Oral Cavity, AJCC 8th Edition - Clinical stage from 08/05/2022: Stage III (cT3, cN1, cM0) - Signed by Lloyd Huger, MD on 08/05/2022 Stage prefix: Initial diagnosis   Verlon Au, NP  10/27/2022

## 2022-10-28 ENCOUNTER — Other Ambulatory Visit: Payer: Self-pay

## 2022-10-28 ENCOUNTER — Encounter: Payer: Self-pay | Admitting: Nurse Practitioner

## 2022-10-28 ENCOUNTER — Encounter: Payer: Self-pay | Admitting: Oncology

## 2022-10-28 ENCOUNTER — Ambulatory Visit
Admission: RE | Admit: 2022-10-28 | Discharge: 2022-10-28 | Disposition: A | Discharge status: Home / Self Care | Payer: BC Managed Care – PPO | Source: Ambulatory Visit | Attending: Radiation Oncology | Admitting: Radiation Oncology

## 2022-10-28 DIAGNOSIS — Z510 Encounter for antineoplastic radiation therapy: Secondary | ICD-10-CM | POA: Diagnosis not present

## 2022-10-28 LAB — RAD ONC ARIA SESSION SUMMARY
Course Elapsed Days: 53
Plan Fractions Treated to Date: 32
Plan Prescribed Dose Per Fraction: 2 Gy
Plan Total Fractions Prescribed: 35
Plan Total Prescribed Dose: 70 Gy
Reference Point Dosage Given to Date: 64 Gy
Reference Point Session Dosage Given: 2 Gy
Session Number: 32

## 2022-10-31 ENCOUNTER — Other Ambulatory Visit: Payer: Self-pay

## 2022-10-31 ENCOUNTER — Ambulatory Visit
Admission: RE | Admit: 2022-10-31 | Discharge: 2022-10-31 | Disposition: A | Discharge status: Home / Self Care | Payer: BC Managed Care – PPO | Source: Ambulatory Visit | Attending: Radiation Oncology | Admitting: Radiation Oncology

## 2022-10-31 ENCOUNTER — Ambulatory Visit: Payer: BC Managed Care – PPO

## 2022-10-31 DIAGNOSIS — Z510 Encounter for antineoplastic radiation therapy: Secondary | ICD-10-CM | POA: Diagnosis not present

## 2022-10-31 LAB — RAD ONC ARIA SESSION SUMMARY
Course Elapsed Days: 56
Plan Fractions Treated to Date: 33
Plan Prescribed Dose Per Fraction: 2 Gy
Plan Total Fractions Prescribed: 35
Plan Total Prescribed Dose: 70 Gy
Reference Point Dosage Given to Date: 66 Gy
Reference Point Session Dosage Given: 2 Gy
Session Number: 33

## 2022-11-01 ENCOUNTER — Ambulatory Visit
Admission: RE | Admit: 2022-11-01 | Discharge: 2022-11-01 | Disposition: A | Discharge status: Home / Self Care | Payer: BC Managed Care – PPO | Source: Ambulatory Visit | Attending: Radiation Oncology | Admitting: Radiation Oncology

## 2022-11-01 ENCOUNTER — Other Ambulatory Visit: Payer: Self-pay

## 2022-11-01 ENCOUNTER — Ambulatory Visit: Payer: BC Managed Care – PPO

## 2022-11-01 DIAGNOSIS — Z510 Encounter for antineoplastic radiation therapy: Secondary | ICD-10-CM | POA: Diagnosis not present

## 2022-11-01 LAB — RAD ONC ARIA SESSION SUMMARY
Course Elapsed Days: 57
Plan Fractions Treated to Date: 34
Plan Prescribed Dose Per Fraction: 2 Gy
Plan Total Fractions Prescribed: 35
Plan Total Prescribed Dose: 70 Gy
Reference Point Dosage Given to Date: 68 Gy
Reference Point Session Dosage Given: 2 Gy
Session Number: 34

## 2022-11-02 ENCOUNTER — Other Ambulatory Visit: Payer: Self-pay

## 2022-11-02 ENCOUNTER — Ambulatory Visit
Admission: RE | Admit: 2022-11-02 | Discharge: 2022-11-02 | Disposition: A | Discharge status: Home / Self Care | Payer: BC Managed Care – PPO | Source: Ambulatory Visit | Attending: Radiation Oncology | Admitting: Radiation Oncology

## 2022-11-02 DIAGNOSIS — Z510 Encounter for antineoplastic radiation therapy: Secondary | ICD-10-CM | POA: Diagnosis not present

## 2022-11-02 LAB — RAD ONC ARIA SESSION SUMMARY
Course Elapsed Days: 58
Plan Fractions Treated to Date: 35
Plan Prescribed Dose Per Fraction: 2 Gy
Plan Total Fractions Prescribed: 35
Plan Total Prescribed Dose: 70 Gy
Reference Point Dosage Given to Date: 70 Gy
Reference Point Session Dosage Given: 2 Gy
Session Number: 35

## 2022-11-03 ENCOUNTER — Encounter: Payer: BC Managed Care – PPO | Admitting: Speech Pathology

## 2022-11-08 ENCOUNTER — Ambulatory Visit: Payer: BC Managed Care – PPO | Attending: Oncology | Admitting: Speech Pathology

## 2022-11-08 DIAGNOSIS — R1313 Dysphagia, pharyngeal phase: Secondary | ICD-10-CM | POA: Diagnosis present

## 2022-11-08 DIAGNOSIS — C029 Malignant neoplasm of tongue, unspecified: Secondary | ICD-10-CM | POA: Diagnosis present

## 2022-11-11 NOTE — Therapy (Signed)
OUTPATIENT SPEECH LANGUAGE PATHOLOGY ONCOLOGY EVALUATION   Patient Name: Holly Vega MRN: [id number] DOB:08/29/1976, 47 y.o., female Today's Date: 11/11/2022  PCP: None in chart REFERRING PROVIDER: Delight Hoh, MD   End of Session - 11/11/22 1353     Visit Number 1    Number of Visits 17    Date for SLP Re-Evaluation 01/20/23    Authorization Type BCBS COMM PPO    Authorization - Visit Number 1    Authorization - Number of Visits 30    Progress Note Due on Visit 10    SLP Start Time 0900    SLP Stop Time  1000    SLP Time Calculation (min) 60 min    Activity Tolerance Patient tolerated treatment well             Past Medical History:  Diagnosis Date   Depression    Difficult intubation    Endometriosis    Tongue cancer ([place])    Past Surgical History:  Procedure Laterality Date   ABDOMINAL HYSTERECTOMY  11/20/2018   [facility]   APPENDECTOMY  11/20/2018   [facility]   COLON RESECTION     DIRECT LARYNGOSCOPY N/A 07/21/2022   Procedure: DIRECT MICROLARYNGOSCOPY;  Surgeon: Margaretha Sheffield, MD;  Location: [place];  Service: ENT;  Laterality: N/A;   IR IMAGING GUIDED PORT INSERTION  09/29/2022   LAPAROSCOPY  01/25/2018   Procedure: LAPAROSCOPY DIAGNOSTIC;  Surgeon: Will Bonnet, MD;  Location: [facility];  Service: Gynecology;;   TONGUE BIOPSY N/A 07/21/2022   Procedure: TONGUE BASE BIOPSY;  Surgeon: Margaretha Sheffield, MD;  Location: [place];  Service: ENT;  Laterality: N/A;   Patient Active Problem List   Diagnosis Date Noted   Tongue cancer ([place]) 08/05/2022   Hormone replacement therapy 12/24/2018   Rectal endometriosis 10/24/2018   Endometriosis determined by laparoscopy 01/25/2018   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  PET scan results from August 10, 2022 reviewed independently with hypermetabolic left base of tongue mass and minimally metabolic lymph node unclear if this is a metastasis or reactive. Pt received  7 weeks doses of cisplatin 40 mg/m2 with daily XRT (70 Gy) ending on 11/02/2022  PAIN:  Are you having pain? No  FALLS: Has patient fallen in last 6 months?  Yes  LIVING ENVIRONMENT: Lives with: lives with their spouse Lives in: House/apartment  PLOF:  Level of assistance: Independent with ADLs, Independent with IADLs Employment: On disability   PATIENT GOALS to be able to eat/open her jaw  OBJECTIVE:    COGNITION: Overall cognitive status: Within functional limits for tasks assessed  ORAL MOTOR EXAMINATION Facial : WFL Lingual: Strength Impaired: Impaired left, Sensation Impaired: Impaired left Velum: WFL Mandible: moderate trismus OI is 20 mm Cough: WFL Voice: WFL   CLINICAL SWALLOW ASSESSMENT:   Current diet: current diet modifications: intermittently snacks on food Dentition: adequate natural dentition Patient directly observed with POs:  Yes: dysphagia 3 (soft), dysphagia 1 (puree), and thin liquids  Feeding: able to feed self Liquids provided by: cup Oral phase signs and symptoms: prolonged bolus formation Pharyngeal phase signs and symptoms: complaints of residue    PATIENT EDUCATION: Education details: late effects head/neck radiation on swallow function Person educated: Patient Education method: Customer service manager Education comprehension: verbalized understanding and needs  further education   ASSESSMENT:  CLINICAL IMPRESSION: Patient is a 47 y.o. female who was seen today for assessment of swallowing as they undergo radiation/chemoradiation therapy. Today pt ate items from Dys III and drank thin liquids. At this time pt swallowing is deemed WNL/WFL with these POs. No oral or overt s/sx pharyngeal deficits, including aspiration were observed. There are no overt s/s aspiration PNA observed by SLP nor any reported by pt at this time. Data indicate that pt's swallow ability will likely decrease over the course of radiation/chemoradiation therapy and could very well decline over time following the conclusion of that therapy due to muscle disuse atrophy and/or muscle fibrosis. Pt will cont to need to be seen by SLP in order to assess safety of PO intake, assess the need for recommending any objective swallow assessment, and ensuring pt is correctly completing the individualized HEP.  OBJECTIVE IMPAIRMENTS include dysphagia. These impairments are limiting patient from effectively communicating at home and in community and safety when swallowing. Factors affecting potential to achieve goals and functional outcome are severity of impairments. Patient will benefit from skilled SLP services to address above impairments and improve overall function.  REHAB POTENTIAL: Good   GOALS: Goals reviewed with patient? Yes  SHORT TERM GOALS: Target date: 10 sessions  Pt will complete HEP with rare min A Baseline: Goal status: INITIAL   LONG TERM GOALS: Target date: 01/20/2023  Pt will describe how to modify HEP over time, and the timeline associated with reduction in HEP frequency with modified independence over two sessions Baseline:  Goal status: INITIAL   PLAN: SLP FREQUENCY: 1x/week  SLP DURATION: 10 weeks  PLANNED INTERVENTIONS: Aspiration precaution training, Pharyngeal strengthening exercises, Diet toleration management , and instrumental swallow study   Katena Petitjean  B. Rutherford Nail, M.S., CCC-SLP, Mining engineer Certified Brain Injury [place]  [place] Office [phone] Ascom [phone] Fax [phone]

## 2022-11-15 ENCOUNTER — Ambulatory Visit: Payer: BC Managed Care – PPO | Admitting: Speech Pathology

## 2022-11-15 DIAGNOSIS — R1313 Dysphagia, pharyngeal phase: Secondary | ICD-10-CM

## 2022-11-15 DIAGNOSIS — C029 Malignant neoplasm of tongue, unspecified: Secondary | ICD-10-CM | POA: Diagnosis not present

## 2022-11-16 NOTE — Therapy (Signed)
OUTPATIENT SPEECH LANGUAGE PATHOLOGY TREATMENT NOTE   Patient Name: Holly Vega MRN: [id number] DOB:1976/02/06, 47 y.o., female Today's Date: 11/16/2022  PCP: none in chart REFERRING PROVIDER: Delight Hoh, MD  END OF SESSION:   End of Session - 11/16/22 1257     Visit Number 2    Number of Visits 17    Date for SLP Re-Evaluation 01/20/23    Authorization Type BCBS COMM PPO    Authorization - Visit Number 2    Authorization - Number of Visits 30    Progress Note Due on Visit 10    SLP Start Time 1500    SLP Stop Time  1600    SLP Time Calculation (min) 60 min    Activity Tolerance Patient tolerated treatment well             Past Medical History:  Diagnosis Date   Depression    Difficult intubation    Endometriosis    Tongue cancer ([place])    Past Surgical History:  Procedure Laterality Date   ABDOMINAL HYSTERECTOMY  11/20/2018   [facility]   APPENDECTOMY  11/20/2018   [facility]   COLON RESECTION     DIRECT LARYNGOSCOPY N/A 07/21/2022   Procedure: DIRECT MICROLARYNGOSCOPY;  Surgeon: Margaretha Sheffield, MD;  Location: [place];  Service: ENT;  Laterality: N/A;   IR IMAGING GUIDED PORT INSERTION  09/29/2022   LAPAROSCOPY  01/25/2018   Procedure: LAPAROSCOPY DIAGNOSTIC;  Surgeon: Will Bonnet, MD;  Location: [facility];  Service: Gynecology;;   TONGUE BIOPSY N/A 07/21/2022   Procedure: TONGUE BASE BIOPSY;  Surgeon: Margaretha Sheffield, MD;  Location: [place];  Service: ENT;  Laterality: N/A;   Patient Active Problem List   Diagnosis Date Noted   Tongue cancer ([place]) 08/05/2022   Hormone replacement therapy 12/24/2018   Rectal endometriosis 10/24/2018   Endometriosis determined by laparoscopy 01/25/2018   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  PET scan results from August 10, 2022 reviewed independently with hypermetabolic left base of tongue mass and minimally metabolic lymph node unclear if this is a metastasis or reactive. Pt received  7 weeks doses of cisplatin 40 mg/m2 with daily XRT (70 Gy) ending on 11/02/2022   THERAPY DIAG:  Tongue cancer ([place])  Dysphagia, pharyngeal phase  Rationale for Evaluation and Treatment Rehabilitation  SUBJECTIVE: pt continues to struggle with lack of taste and weight loss  Pt accompanied by: self  PAIN:  Are you having pain? No  PATIENT GOALS: to be able to eat/open her jaw   OBJECTIVE:   TODAY'S TREATMENT: TODAY'S TREATMENT:  Research states the risk for dysphagia increases due to radiation and/or chemotherapy treatment due to a variety of factors, so SLP educated the pt about the possibility of reduced/limited ability for PO intake during rad tx. SLP also educated pt regarding possible changes to swallowing musculature after rad tx, and why adherence to dysphagia HEP provided today and PO consumption was necessary to inhibit muscle fibrosis following rad tx and to mitigate muscle disuse atrophy. SLP informed pt why this would be detrimental to their swallowing status and to their pulmonary health. Pt  demonstrated understanding of these things to SLP. SLP encouraged pt to safely eat and drink as deep into their radiation/chemotherapy as possible to provide the best possible long-term swallowing outcome for pt.    SLP then developed an individualized HEP for pt involving oral and pharyngeal strengthening and ROM and pt was instructed how to perform these exercises, including SLP demonstration. After SLP demonstration, pt return demonstrated each exercise. SLP ensured pt performance was correct prior to educating pt on next  exercise. Pt required mod cues faded to modified independent to perform HEP. Pt was instructed to complete this program 6-7 days/week, at least 2 times a day.       PATIENT EDUCATION: Education details: see above Person educated: Patient Education method: Education officer, environmental, Verbal cues, and Handouts Education comprehension: verbalized understanding and needs further education  HOME EXERCISE PROGRAM:  Complete pharyngeal strengthening exercises  GOALS: Goals reviewed with patient? Yes   SHORT TERM GOALS: Target date: 10 sessions   Pt will complete HEP with rare min A Baseline: Goal status: INITIAL     LONG TERM GOALS: Target date: 01/20/2023   Pt will describe how to modify HEP over time, and the timeline associated with reduction in HEP frequency with modified independence over two sessions Baseline:  Goal status: INITIAL  ASSESSMENT:  CLINICAL IMPRESSION: Pt presents with moderate trismus, decreased taste, weight loss and increase risk of aspiration following chemoradiation for left tongue base cancer.   OBJECTIVE IMPAIRMENTS include dysphagia. These impairments are limiting patient from effectively communicating at home and in community and safety when swallowing. Factors affecting potential to achieve goals and functional outcome are medical prognosis. Patient will benefit from skilled SLP services to address above impairments and improve overall function.  REHAB POTENTIAL: Good  PLAN: SLP FREQUENCY: 1-2x/week  SLP DURATION: 10 weeks  PLANNED INTERVENTIONS: Aspiration precaution training, Pharyngeal strengthening exercises, SLP instruction and feedback, Compensatory strategies, and Patient/family education   Candace Begue B. Rutherford Nail, M.S., CCC-SLP, Mining engineer Certified Brain Injury [place]  [place] Office [phone] Ascom [phone] Fax [phone]

## 2022-11-22 ENCOUNTER — Other Ambulatory Visit: Payer: Self-pay | Admitting: *Deleted

## 2022-11-22 ENCOUNTER — Inpatient Hospital Stay: Payer: BC Managed Care – PPO

## 2022-11-22 ENCOUNTER — Inpatient Hospital Stay (HOSPITAL_BASED_OUTPATIENT_CLINIC_OR_DEPARTMENT_OTHER): Payer: BC Managed Care – PPO | Admitting: Oncology

## 2022-11-22 ENCOUNTER — Encounter: Payer: Self-pay | Admitting: Oncology

## 2022-11-22 VITALS — BP 107/77 | HR 83 | Temp 96.7°F | Resp 16 | Ht 67.0 in | Wt 122.0 lb

## 2022-11-22 DIAGNOSIS — R1310 Dysphagia, unspecified: Secondary | ICD-10-CM

## 2022-11-22 DIAGNOSIS — C029 Malignant neoplasm of tongue, unspecified: Secondary | ICD-10-CM

## 2022-11-22 DIAGNOSIS — Z510 Encounter for antineoplastic radiation therapy: Secondary | ICD-10-CM | POA: Diagnosis not present

## 2022-11-22 LAB — BASIC METABOLIC PANEL
Anion gap: 8 (ref 5–15)
BUN: 12 mg/dL (ref 6–20)
CO2: 26 mmol/L (ref 22–32)
Calcium: 9.5 mg/dL (ref 8.9–10.3)
Chloride: 102 mmol/L (ref 98–111)
Creatinine, Ser: 0.99 mg/dL (ref 0.44–1.00)
GFR, Estimated: >60 mL/min (ref >60)
Glucose, Bld: 126 mg/dL — ABNORMAL HIGH (ref 70–99)
Potassium: 3.8 mmol/L (ref 3.5–5.1)
Sodium: 136 mmol/L (ref 135–145)

## 2022-11-22 LAB — CBC WITH DIFFERENTIAL/PLATELET
Abs Immature Granulocytes: 0.01 10*3/uL (ref 0.00–0.07)
Basophils Absolute: 0 10*3/uL (ref 0.0–0.1)
Basophils Relative: 1 %
Eosinophils Absolute: 0 10*3/uL (ref 0.0–0.5)
Eosinophils Relative: 1 %
HCT: 36.4 % (ref 36.0–46.0)
Hemoglobin: 12.4 g/dL (ref 12.0–15.0)
Immature Granulocytes: 0 %
Lymphocytes Relative: 12 %
Lymphs Abs: 0.3 10*3/uL — ABNORMAL LOW (ref 0.7–4.0)
MCH: 32.7 pg (ref 26.0–34.0)
MCHC: 34.1 g/dL (ref 30.0–36.0)
MCV: 96 fL (ref 80.0–100.0)
Monocytes Absolute: 0.3 10*3/uL (ref 0.1–1.0)
Monocytes Relative: 11 %
Neutro Abs: 2.2 10*3/uL (ref 1.7–7.7)
Neutrophils Relative %: 75 %
Platelets: 163 10*3/uL (ref 150–400)
RBC: 3.79 MIL/uL — ABNORMAL LOW (ref 3.87–5.11)
RDW: 17.3 % — ABNORMAL HIGH (ref 11.5–15.5)
WBC: 3 10*3/uL — ABNORMAL LOW (ref 4.0–10.5)
nRBC: 0 % (ref 0.0–0.2)

## 2022-11-22 LAB — MAGNESIUM: Magnesium: 2.1 mg/dL (ref 1.7–2.4)

## 2022-11-22 NOTE — Progress Notes (Unsigned)
Still not able to eat much. Losing weight.

## 2022-11-22 NOTE — Progress Notes (Signed)
i

## 2022-11-22 NOTE — Progress Notes (Signed)
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  INTERVAL HISTORY: Patient returns to clinic today for repeat laboratory work in 1 month follow-up after completing her treatments.  She continues to have a poor appetite which she attributes to taste, but otherwise feels well.  She does not complain of any pain or dysphagia.  She has no neurologic complaints.  She denies any recent fevers or illnesses.  She has no chest pain, shortness of breath, cough, or hemoptysis.  She denies any nausea, vomiting, constipation, or diarrhea.  She has no urinary complaints.  Patient offers no further specific complaints today.  REVIEW OF SYSTEMS:   Review of Systems  Constitutional: Negative.  Negative for fever, malaise/fatigue and weight loss.  HENT:  Negative for sore throat.   Respiratory: Negative.  Negative for cough, hemoptysis and shortness of breath.   Cardiovascular: Negative.  Negative for chest pain and leg swelling.  Gastrointestinal: Negative.  Negative for abdominal pain.  Genitourinary: Negative.  Negative for dysuria.  Musculoskeletal:  Negative for neck pain.  Skin: Negative.  Negative for rash.  Neurological: Negative.  Negative for dizziness, focal weakness, weakness and headaches.  Psychiatric/Behavioral: Negative.  The patient is not nervous/anxious.     As per HPI. Otherwise, a complete review of systems is negative.  PAST MEDICAL HISTORY: Past Medical History:  Diagnosis Date   Depression    Difficult intubation    Endometriosis    Tongue cancer [facility])     PAST SURGICAL HISTORY: Past Surgical History:  Procedure Laterality Date   ABDOMINAL HYSTERECTOMY   11/20/2018   [facility]   APPENDECTOMY  11/20/2018   [facility]   COLON RESECTION     DIRECT LARYNGOSCOPY N/A 07/21/2022   Procedure: DIRECT MICROLARYNGOSCOPY;  Surgeon: Vernie Murders, MD;  Location: [facility];  Service: ENT;  Laterality: N/A;   IR IMAGING GUIDED PORT INSERTION  09/29/2022   LAPAROSCOPY  01/25/2018   Procedure: LAPAROSCOPY DIAGNOSTIC;  Surgeon: Conard Novak, MD;  Location: [facility] OR[facility];  Service: Gynecology;;   TONGUE BIOPSY N/A 07/21/2022   Procedure: TONGUE BASE BIOPSY;  Surgeon: Vernie Murders, MD;  Location: [facility];  Service: ENT;  Laterality: N/A;    FAMILY HISTORY: Family History  Problem Relation Age of Onset   Cancer Maternal Uncle     ADVANCED DIRECTIVES (Y/N):  N  HEALTH MAINTENANCE: Social History   Tobacco Use   Smoking status: Former    Packs/day: 0.75    Years: 30.00    Total pack years: 22.50    Types: Cigarettes    Quit date: 07/05/2022    Years since quitting: 0.3   Smokeless tobacco: Never   Tobacco comments:       Vaping Use   Vaping Use: Never used  Substance Use Topics   Alcohol use: No   Drug use: No     Colonoscopy:  PAP:  Bone density:  Lipid panel:  Allergies  Allergen Reactions   Chlorhexidine Gluconate Rash    Current Outpatient Medications  Medication Sig Dispense Refill   lidocaine-prilocaine (EMLA) cream Apply 1 Application topically as needed. 30 g 0   HYDROcodone-acetaminophen (NORCO) 5-325 MG tablet Take 1 tablet by mouth every 6 (six)  hours as needed for moderate pain. (Patient not taking: Reported on 11/22/2022) 60 tablet 0   hydrOXYzine (ATARAX) 10 MG tablet Take 1-2 tablets (10-20 mg total) by mouth 3 (three) times daily as needed for itching. May cause drowsiness (Patient not taking: Reported on 11/22/2022) 30 tablet 0   naproxen (NAPROSYN) 500 MG tablet Take 1 tablet (500 mg total) by mouth 2 (two) times daily with a meal. (Patient not taking: Reported on 10/21/2022) 10 tablet 2   ondansetron  (ZOFRAN) 8 MG tablet Take 1 tablet (8 mg total) by mouth every 8 (eight) hours as needed for nausea or vomiting. Start on the third day after cisplatin. (Patient not taking: Reported on 11/22/2022) 60 tablet 1   prochlorperazine (COMPAZINE) 10 MG tablet Take 1 tablet (10 mg total) by mouth every 6 (six) hours as needed (Nausea or vomiting). (Patient not taking: Reported on 11/22/2022) 60 tablet 1   sucralfate (CARAFATE) 1 g tablet Take 1 tablet (1 g total) by mouth 4 (four) times daily -  with meals and at bedtime. Dissolve tablet in 3 to 4 tablespoons of warm. (Patient not taking: Reported on 11/22/2022) 90 tablet 1   traMADol (ULTRAM) 50 MG tablet Take 1 tablet (50 mg total) by mouth every 6 (six) hours as needed. (Patient not taking: Reported on 11/22/2022) 30 tablet 0   triamcinolone cream (KENALOG) 0.1 % Apply 1 Application topically 2 (two) times daily. To skin irritation of right upper chest (Patient not taking: Reported on 11/22/2022) 80 g 0   No current facility-administered medications for this visit.    OBJECTIVE: Vitals:   11/22/22 1040  BP: 107/77  Pulse: 83  Resp: 16  Temp: (!) 96.7 F (35.9 C)  SpO2: 100%     Body mass index is 19.11 kg/m.    ECOG FS:0 - Asymptomatic  General: Well-developed, well-nourished, no acute distress. Eyes: Pink conjunctiva, anicteric sclera. HEENT: Normocephalic, moist mucous membranes.  No palpable lymphadenopathy. Lungs: No audible wheezing or coughing. Heart: Regular rate and rhythm. Abdomen: Soft, nontender, no obvious distention. Musculoskeletal: No edema, cyanosis, or clubbing. Neuro: Alert, answering all questions appropriately. Cranial nerves grossly intact. Skin: No rashes or petechiae noted. Psych: Normal affect.  LAB RESULTS:  Lab Results  Component Value Date   NA 136 11/22/2022   K 3.8 11/22/2022   CL 102 11/22/2022   CO2 26 11/22/2022   GLUCOSE 126 (H) 11/22/2022   BUN 12 11/22/2022   CREATININE 0.99 11/22/2022   CALCIUM  9.5 11/22/2022   PROT 6.9 10/27/2022   ALBUMIN 4.2 10/27/2022   AST 19 10/27/2022   ALT 12 10/27/2022   ALKPHOS 59 10/27/2022   BILITOT 0.3 10/27/2022   GFRNONAA >60 11/22/2022   GFRAA >60 01/15/2018    Lab Results  Component Value Date   WBC 3.0 (L) 11/22/2022   NEUTROABS 2.2 11/22/2022   HGB 12.4 11/22/2022   HCT 36.4 11/22/2022   MCV 96.0 11/22/2022   PLT 163 11/22/2022     STUDIES: No results found.  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  PET scan results from August 10, 2022 reviewed independently with hypermetabolic left base of tongue mass and minimally metabolic lymph node unclear if this is a metastasis or reactive.  Patient completed weekly cisplatin along  with daily XRT on March 22, 2023.  She has been instructed to follow-up with ENT.  Return to clinic in 2 months with repeat laboratory work, further evaluation and imaging with PET scan.      Pain: Patient does not complain of this today.  Continue tramadol as needed.  She also has Norco as needed for nighttime use. IV access: Patient now has a port. Dysphagia: Patient does not complain of this today.  Continue Carafate as needed.  Patient has a swallow study scheduled for next week. Leukopenia: Mild, monitor.   Thrombocytopenia: Resolved.   Rash: Resolved.   Patient expressed understanding and was in agreement with this plan. She also understands that She can call clinic at any time with any questions, concerns, or complaints.    Cancer Staging  Tongue cancer [facility]) Staging form: Oral Cavity, AJCC 8th Edition - Clinical stage from 08/05/2022: Stage III (cT3, cN1, cM0) - Signed by Jeralyn Ruths, MD on 08/05/2022 Stage prefix: Initial diagnosis   Jeralyn Ruths, MD   11/22/2022  11:03 AM

## 2022-11-23 ENCOUNTER — Other Ambulatory Visit: Payer: Self-pay

## 2022-11-23 ENCOUNTER — Encounter: Payer: Self-pay | Admitting: Oncology

## 2022-11-25 ENCOUNTER — Ambulatory Visit
Admission: RE | Admit: 2022-11-25 | Discharge: 2022-11-25 | Disposition: A | Discharge status: Home / Self Care | Payer: BC Managed Care – PPO | Source: Ambulatory Visit | Attending: Oncology | Admitting: Oncology

## 2022-11-25 DIAGNOSIS — R1310 Dysphagia, unspecified: Secondary | ICD-10-CM | POA: Diagnosis present

## 2022-11-25 NOTE — Progress Notes (Signed)
Modified Barium Swallow Study  Patient Details  Name: Holly Vega MRN: [id number] Date of Birth: Nov 24, 1975  Today's Date: 11/25/2022  Modified Barium Swallow completed.  Full report located under Chart Review in the Imaging Section.  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  PET scan results from August 10, 2022 reviewed independently with hypermetabolic left base of tongue mass and minimally metabolic lymph node unclear if this is a metastasis or reactive. Pt received  7 weeks doses of cisplatin 40 mg/m2 with daily XRT (70 Gy) ending on 11/02/2022   Clinical Impression Pt presents with adequate oropharyngeal abilities when consuming thin liquids, puree, graham cracker with barium paste, whole barium tablet with thin liquids. While pt's exhibits functional abilities, she stated, "I am having to learn to eat again" referencing loss of taste and overall sickness from chemoradiation. She does report that she ate a greater variety of food over this past week. Factors that may increase risk of adverse event in presence of aspiration Phineas Douglas & Padilla 2021):  (recent chemoradiation for BOT cancer)  Swallow Evaluation Recommendations Recommendations: PO diet PO Diet Recommendation: Regular;Thin liquids (Level 0) Liquid Administration via: Cup Medication Administration: Whole meds with liquid Supervision: Patient able to self-feed Swallowing strategies  : Minimize environmental distractions;Slow rate;Small bites/sips Postural changes: Position pt fully upright for meals Oral care recommendations: Oral care BID (2x/day)   Alwilda Gilland B. Rutherford Nail, M.S., CCC-SLP, Mining engineer Certified Brain Injury [place]  [place] Office [phone] Ascom [phone] Fax  [phone]

## 2022-12-01 ENCOUNTER — Ambulatory Visit: Payer: BC Managed Care – PPO | Attending: Oncology | Admitting: Speech Pathology

## 2022-12-01 DIAGNOSIS — R1312 Dysphagia, oropharyngeal phase: Secondary | ICD-10-CM | POA: Insufficient documentation

## 2022-12-01 DIAGNOSIS — C029 Malignant neoplasm of tongue, unspecified: Secondary | ICD-10-CM | POA: Diagnosis present

## 2022-12-01 NOTE — Therapy (Signed)
OUTPATIENT SPEECH LANGUAGE PATHOLOGY TREATMENT NOTE   Patient Name: Holly Vega MRN: [id number] DOB:08-Oct-1975, 47 y.o., female Today's Date: 12/01/2022  PCP: none in chart REFERRING PROVIDER: Delight Hoh, MD  END OF SESSION:   End of Session - 12/01/22 1303     Visit Number 3    Number of Visits 17    Date for SLP Re-Evaluation 01/20/23    Authorization Type BCBS COMM PPO    Authorization - Visit Number 3    Authorization - Number of Visits 30    Progress Note Due on Visit 10    SLP Start Time 1300    SLP Stop Time  1400    SLP Time Calculation (min) 60 min    Activity Tolerance Patient tolerated treatment well             Past Medical History:  Diagnosis Date   Depression    Difficult intubation    Endometriosis    Tongue cancer ([place])    Past Surgical History:  Procedure Laterality Date   ABDOMINAL HYSTERECTOMY  11/20/2018   [facility]   APPENDECTOMY  11/20/2018   [facility]   COLON RESECTION     DIRECT LARYNGOSCOPY N/A 07/21/2022   Procedure: DIRECT MICROLARYNGOSCOPY;  Surgeon: Margaretha Sheffield, MD;  Location: [place];  Service: ENT;  Laterality: N/A;   IR IMAGING GUIDED PORT INSERTION  09/29/2022   LAPAROSCOPY  01/25/2018   Procedure: LAPAROSCOPY DIAGNOSTIC;  Surgeon: Will Bonnet, MD;  Location: [facility];  Service: Gynecology;;   TONGUE BIOPSY N/A 07/21/2022   Procedure: TONGUE BASE BIOPSY;  Surgeon: Margaretha Sheffield, MD;  Location: [place];  Service: ENT;  Laterality: N/A;   Patient Active Problem List   Diagnosis Date Noted   Tongue cancer ([place]) 08/05/2022   Hormone replacement therapy 12/24/2018   Rectal endometriosis 10/24/2018   Endometriosis determined by laparoscopy 01/25/2018   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  PET scan results from August 10, 2022 reviewed independently with hypermetabolic left base of tongue mass and minimally metabolic lymph node unclear if this is a metastasis or reactive. Pt received  7 weeks doses of cisplatin 40 mg/m2 with daily XRT (70 Gy) ending on 11/02/2022   THERAPY DIAG:  Dysphagia, oropharyngeal phase  Tongue cancer ([place])  Rationale for Evaluation and Treatment Rehabilitation  SUBJECTIVE: pt continues to struggle with lack of taste and weight loss  Pt accompanied by: self  PAIN:  Are you having pain? No  PATIENT GOALS: to be able to eat/open her jaw   OBJECTIVE:   TODAY'S TREATMENT: Skilled treatment session focused on pt's moderate trismus. SLP facilitated session by providing education on passive and active muscle movement. ARK-J device created and altered to provide passive resistance without pain or burning. Pt able to complete 2 sets of 10 reps of passive and active exercises. Instructions provided on how to re-create device.   Pt also reports that she is experiencing more taste throughout her mouth, continues to use Luden's cough drops and she has noted improvement in her consumption.    PATIENT EDUCATION: Education details: see above Person educated: Patient Education method:  Explanation, Demonstration, Verbal cues, and Handouts Education comprehension: verbalized understanding and needs further education  HOME EXERCISE PROGRAM:  Complete pharyngeal and jaw strengthening exercises  GOALS: Goals reviewed with patient? Yes   SHORT TERM GOALS: Target date: 10 sessions   Pt will complete HEP with rare min A Baseline: Goal status: INITIAL     LONG TERM GOALS: Target date: 01/20/2023   Pt will describe how to modify HEP over time, and the timeline associated with reduction in  HEP frequency with modified independence over two sessions Baseline:  Goal status: INITIAL  ASSESSMENT:  CLINICAL IMPRESSION: Pt presents with improved PO intake, taste and she is now completing exercises to target her moderate trismus.   OBJECTIVE IMPAIRMENTS include dysphagia. These impairments are limiting patient from effectively communicating at home and in community and safety when swallowing. Factors affecting potential to achieve goals and functional outcome are medical prognosis. Patient will benefit from skilled SLP services to address above impairments and improve overall function.  REHAB POTENTIAL: Good  PLAN: SLP FREQUENCY: 1-2x/week  SLP DURATION: 10 weeks  PLANNED INTERVENTIONS: Aspiration precaution training, Pharyngeal strengthening exercises, SLP instruction and feedback, Compensatory strategies, and Patient/family education   Emaad Nanna B. Rutherford Nail, M.S., CCC-SLP, Mining engineer Certified Brain Injury [place]  [place] Office [phone] Ascom [phone] Fax [phone]

## 2022-12-05 ENCOUNTER — Encounter: Payer: Self-pay | Admitting: Radiation Oncology

## 2022-12-05 ENCOUNTER — Ambulatory Visit
Admission: RE | Admit: 2022-12-05 | Discharge: 2022-12-05 | Disposition: A | Discharge status: Home / Self Care | Payer: BC Managed Care – PPO | Source: Ambulatory Visit | Attending: Oncology | Admitting: Oncology

## 2022-12-05 VITALS — BP 120/76 | HR 76 | Temp 97.8°F | Resp 16 | Ht 67.0 in | Wt 123.4 lb

## 2022-12-05 DIAGNOSIS — C029 Malignant neoplasm of tongue, unspecified: Secondary | ICD-10-CM

## 2022-12-05 NOTE — Progress Notes (Signed)
Radiation Oncology Follow up Note  Name: Dealie Baluyot   Date:   12/05/2022 MRN:  [id number] DOB: 1975/12/30    This 47 y.o. female presents to the clinic today for 1 month follow-up status post radiation therapy for stage IIIa (cT2 cN1 M0) p16 positive squamous cell carcinoma of the left tongue base status post concurrent chemoradiation.  REFERRING PROVIDER: Lloyd Huger, MD  HPI: Patient is a 47 year old female now 1 month out from concurrent chemoradiation therapy for stage III p16 positive squamous of carcinoma the left tongue base.  She had difficulty throughout treatment with significant trismus.  She is swallowing well now she is having no head and neck pain or dysphagia.  She has been working with physical therapy and trismus has improved greatly.  Some of her taste is returning.  She has not yet seen ENT in follow-up..  COMPLICATIONS OF TREATMENT: none  FOLLOW UP COMPLIANCE: keeps appointments   PHYSICAL EXAM:  BP 120/76 (BP Location: Left Arm, Patient Position: Sitting, Cuff Size: Normal)   Pulse 76   Temp 97.8 F (36.6 C)   Resp 16   Ht '5\' 7"'$  (1.702 m) Comment: stated ht  Wt 123 lb 6.4 oz (56 kg)   LMP 01/28/2018 (Exact Date)   BMI 19.33 kg/m  Oral cavity is clear.  Neck is clear without evidence of cervical or supraclavicular adenopathy.  Trismus has improved.  Well-developed well-nourished patient in NAD. HEENT reveals PERLA, EOMI, discs not visualized.  Oral cavity is clear. No oral mucosal lesions are identified. Neck is clear without evidence of cervical or supraclavicular adenopathy. Lungs are clear to A&P. Cardiac examination is essentially unremarkable with regular rate and rhythm without murmur rub or thrill. Abdomen is benign with no organomegaly or masses noted. Motor sensory and DTR levels are equal and symmetric in the upper and lower extremities. Cranial nerves II through XII are grossly intact. Proprioception is intact. No peripheral adenopathy or edema is  identified. No motor or sensory levels are noted. Crude visual fields are within normal range.  RADIOLOGY RESULTS: No current films for review  PLAN: Present time patient is doing well since completion of her concurrent chemoradiation.  And pleased with her overall progress.  She is making tremendous 3 advances with her trismus.  I have asked her to set up a follow-up appoint with Dr. Christeen Douglas in ENT.  I have asked to see her back in 4 to 5 months for follow-up.  She is already been scheduled for follow-up imaging.  Patient knows to call with any concerns.  I would like to take this opportunity to thank you for allowing me to participate in the care of your patient.Noreene Filbert, MD

## 2022-12-27 ENCOUNTER — Other Ambulatory Visit: Payer: Self-pay

## 2023-01-05 ENCOUNTER — Ambulatory Visit: Payer: BC Managed Care – PPO | Admitting: Speech Pathology

## 2023-01-23 ENCOUNTER — Ambulatory Visit
Admission: RE | Admit: 2023-01-23 | Discharge: 2023-01-23 | Disposition: A | Discharge status: Home / Self Care | Payer: BC Managed Care – PPO | Source: Ambulatory Visit | Attending: Oncology | Admitting: Oncology

## 2023-01-23 ENCOUNTER — Inpatient Hospital Stay: Payer: BC Managed Care – PPO | Attending: Oncology

## 2023-01-23 DIAGNOSIS — Z9221 Personal history of antineoplastic chemotherapy: Secondary | ICD-10-CM | POA: Insufficient documentation

## 2023-01-23 DIAGNOSIS — Z95828 Presence of other vascular implants and grafts: Secondary | ICD-10-CM

## 2023-01-23 DIAGNOSIS — C029 Malignant neoplasm of tongue, unspecified: Secondary | ICD-10-CM

## 2023-01-23 DIAGNOSIS — C01 Malignant neoplasm of base of tongue: Secondary | ICD-10-CM | POA: Insufficient documentation

## 2023-01-23 DIAGNOSIS — D72819 Decreased white blood cell count, unspecified: Secondary | ICD-10-CM | POA: Diagnosis not present

## 2023-01-23 DIAGNOSIS — Z923 Personal history of irradiation: Secondary | ICD-10-CM | POA: Insufficient documentation

## 2023-01-23 DIAGNOSIS — Z87891 Personal history of nicotine dependence: Secondary | ICD-10-CM | POA: Insufficient documentation

## 2023-01-23 DIAGNOSIS — Z09 Encounter for follow-up examination after completed treatment for conditions other than malignant neoplasm: Secondary | ICD-10-CM

## 2023-01-23 LAB — CBC WITH DIFFERENTIAL (CANCER CENTER ONLY)
Abs Immature Granulocytes: 0.02 10*3/uL (ref 0.00–0.07)
Basophils Absolute: 0 10*3/uL (ref 0.0–0.1)
Basophils Relative: 1 %
Eosinophils Absolute: 0.2 10*3/uL (ref 0.0–0.5)
Eosinophils Relative: 4 %
HCT: 35 % — ABNORMAL LOW (ref 36.0–46.0)
Hemoglobin: 11.9 g/dL — ABNORMAL LOW (ref 12.0–15.0)
Immature Granulocytes: 1 %
Lymphocytes Relative: 11 %
Lymphs Abs: 0.5 10*3/uL — ABNORMAL LOW (ref 0.7–4.0)
MCH: 33.7 pg (ref 26.0–34.0)
MCHC: 34 g/dL (ref 30.0–36.0)
MCV: 99.2 fL (ref 80.0–100.0)
Monocytes Absolute: 0.3 10*3/uL (ref 0.1–1.0)
Monocytes Relative: 8 %
Neutro Abs: 3 10*3/uL (ref 1.7–7.7)
Neutrophils Relative %: 75 %
Platelet Count: 179 10*3/uL (ref 150–400)
RBC: 3.53 MIL/uL — ABNORMAL LOW (ref 3.87–5.11)
RDW: 11.9 % (ref 11.5–15.5)
WBC Count: 4 10*3/uL (ref 4.0–10.5)
nRBC: 0 % (ref 0.0–0.2)

## 2023-01-23 LAB — GLUCOSE, CAPILLARY: Glucose-Capillary: 90 mg/dL (ref 70–99)

## 2023-01-23 LAB — CMP (CANCER CENTER ONLY)
ALT: 9 U/L (ref 0–44)
AST: 16 U/L (ref 15–41)
Albumin: 4.4 g/dL (ref 3.5–5.0)
Alkaline Phosphatase: 57 U/L (ref 38–126)
Anion gap: 7 (ref 5–15)
BUN: 10 mg/dL (ref 6–20)
CO2: 25 mmol/L (ref 22–32)
Calcium: 9.6 mg/dL (ref 8.9–10.3)
Chloride: 103 mmol/L (ref 98–111)
Creatinine: 0.68 mg/dL (ref 0.44–1.00)
GFR, Estimated: >60 mL/min (ref >60)
Glucose, Bld: 99 mg/dL (ref 70–99)
Potassium: 3.6 mmol/L (ref 3.5–5.1)
Sodium: 135 mmol/L (ref 135–145)
Total Bilirubin: 0.5 mg/dL (ref 0.3–1.2)
Total Protein: 7.3 g/dL (ref 6.5–8.1)

## 2023-01-23 MED ORDER — FLUDEOXYGLUCOSE F - 18 (FDG) INJECTION
6.0000 | Freq: Once | INTRAVENOUS | Status: AC
  Administered 2023-01-23: 6.5 via INTRAVENOUS

## 2023-01-23 MED ORDER — SODIUM CHLORIDE 0.9% FLUSH
10.0000 mL | Freq: Once | INTRAVENOUS | Status: AC
  Administered 2023-01-23: 10 mL via INTRAVENOUS
  Filled 2023-01-23: qty 10

## 2023-01-23 MED ORDER — HEPARIN SOD (PORK) LOCK FLUSH 100 UNIT/ML IV SOLN
500.0000 [IU] | Freq: Once | INTRAVENOUS | Status: AC
  Administered 2023-01-23: 500 [IU] via INTRAVENOUS
  Filled 2023-01-23: qty 5

## 2023-01-26 ENCOUNTER — Encounter: Payer: Self-pay | Admitting: Oncology

## 2023-01-26 ENCOUNTER — Inpatient Hospital Stay: Payer: BC Managed Care – PPO | Attending: Oncology | Admitting: Oncology

## 2023-01-26 VITALS — BP 129/77 | HR 64 | Temp 96.1°F | Wt 117.2 lb

## 2023-01-26 DIAGNOSIS — C01 Malignant neoplasm of base of tongue: Secondary | ICD-10-CM | POA: Diagnosis present

## 2023-01-26 DIAGNOSIS — C029 Malignant neoplasm of tongue, unspecified: Secondary | ICD-10-CM

## 2023-01-26 DIAGNOSIS — Z923 Personal history of irradiation: Secondary | ICD-10-CM | POA: Insufficient documentation

## 2023-01-26 DIAGNOSIS — Z87891 Personal history of nicotine dependence: Secondary | ICD-10-CM | POA: Diagnosis not present

## 2023-01-26 NOTE — Progress Notes (Signed)
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  INTERVAL HISTORY: Patient returns to clinic today for repeat laboratory work and discussion of her imaging results.  She continues to have mild neck tenderness, but otherwise feels well.  Appetite has improved and she has gained weight.  She otherwise feels well.  She denies any dysphagia.  She has no neurologic complaints.  She denies any recent fevers or illnesses.  She has no chest pain, shortness of breath, cough, or hemoptysis.  She denies any nausea, vomiting, constipation, or diarrhea.  She has no urinary complaints.  Patient offers no further specific complaints today.  REVIEW OF SYSTEMS:   Review of Systems  Constitutional: Negative.  Negative for fever, malaise/fatigue and weight loss.  HENT:  Negative for sore throat.   Respiratory: Negative.  Negative for cough, hemoptysis and shortness of breath.   Cardiovascular: Negative.  Negative for chest pain and leg swelling.  Gastrointestinal: Negative.  Negative for abdominal pain.  Genitourinary: Negative.  Negative for dysuria.  Musculoskeletal:  Negative for neck pain.  Skin: Negative.  Negative for rash.  Neurological: Negative.  Negative for dizziness, focal weakness, weakness and headaches.  Psychiatric/Behavioral: Negative.  The patient is not nervous/anxious.     As per HPI. Otherwise, a complete review of systems is negative.  PAST MEDICAL HISTORY: Past Medical History:  Diagnosis Date   Depression    Difficult intubation    Endometriosis    Tongue cancer [facility])     PAST SURGICAL HISTORY: Past Surgical History:  Procedure Laterality Date   ABDOMINAL  HYSTERECTOMY  11/20/2018   [facility]   APPENDECTOMY  11/20/2018   [facility]   COLON RESECTION     DIRECT LARYNGOSCOPY N/A 07/21/2022   Procedure: DIRECT MICROLARYNGOSCOPY;  Surgeon: Vernie Murders, MD;  Location: [facility];  Service: ENT;  Laterality: N/A;   IR IMAGING GUIDED PORT INSERTION  09/29/2022   LAPAROSCOPY  01/25/2018   Procedure: LAPAROSCOPY DIAGNOSTIC;  Surgeon: Conard Novak, MD;  Location: [facility] ORS;  Service: Gynecology;;   TONGUE BIOPSY N/A 07/21/2022   Procedure: TONGUE BASE BIOPSY;  Surgeon: Vernie Murders, MD;  Location: [facility];  Service: ENT;  Laterality: N/A;    FAMILY HISTORY: Family History  Problem Relation Age of Onset   Cancer Maternal Uncle     ADVANCED DIRECTIVES (Y/N):  N  HEALTH MAINTENANCE: Social History   Tobacco Use   Smoking status: Former    Packs/day: 0.75    Years: 30.00    Additional pack years: 0.00    Total pack years: 22.50    Types: Cigarettes    Quit date: 07/05/2022    Years since quitting: 0.5   Smokeless tobacco: Never   Tobacco comments:       Vaping Use   Vaping Use: Never used  Substance Use Topics   Alcohol use: No   Drug use: No     Colonoscopy:  PAP:  Bone density:  Lipid panel:  Allergies  Allergen Reactions   Chlorhexidine Gluconate Rash    Current Outpatient Medications  Medication Sig Dispense Refill   lidocaine-prilocaine (EMLA) cream Apply 1 Application topically as needed. 30 g 0   No current facility-administered medications  for this visit.    OBJECTIVE: Vitals:   01/26/23 0948  BP: 129/77  Pulse: 64  Temp: (!) 96.1 F (35.6 C)  SpO2: 100%     Body mass index is 18.36 kg/m.    ECOG FS:0 - Asymptomatic  General: Well-developed, well-nourished, no acute distress. Eyes: Pink conjunctiva, anicteric sclera. HEENT: Normocephalic, moist mucous membranes.  No palpable lymphadenopathy. Lungs: No audible wheezing or coughing. Heart: Regular rate and rhythm. Abdomen: Soft,  nontender, no obvious distention. Musculoskeletal: No edema, cyanosis, or clubbing. Neuro: Alert, answering all questions appropriately. Cranial nerves grossly intact. Skin: No rashes or petechiae noted. Psych: Normal affect.  LAB RESULTS:  Lab Results  Component Value Date   NA 135 01/23/2023   K 3.6 01/23/2023   CL 103 01/23/2023   CO2 25 01/23/2023   GLUCOSE 99 01/23/2023   BUN 10 01/23/2023   CREATININE 0.68 01/23/2023   CALCIUM 9.6 01/23/2023   PROT 7.3 01/23/2023   ALBUMIN 4.4 01/23/2023   AST 16 01/23/2023   ALT 9 01/23/2023   ALKPHOS 57 01/23/2023   BILITOT 0.5 01/23/2023   GFRNONAA >60 01/23/2023   GFRAA >60 01/15/2018    Lab Results  Component Value Date   WBC 4.0 01/23/2023   NEUTROABS 3.0 01/23/2023   HGB 11.9 (L) 01/23/2023   HCT 35.0 (L) 01/23/2023   MCV 99.2 01/23/2023   PLT 179 01/23/2023     STUDIES: NM PET Image Restag (PS) Skull Base To Thigh  Result Date: 01/25/2023 CLINICAL DATA:  Subsequent treatment strategy for base of tongue carcinoma. Head neck carcinoma status post chemo radiation therapy. EXAM: NUCLEAR MEDICINE PET SKULL BASE TO THIGH TECHNIQUE: 6.5 mCi F-18 FDG was injected intravenously. Full-ring PET imaging was performed from the skull base to thigh after the radiotracer. CT data was obtained and used for attenuation correction and anatomic localization. Fasting blood glucose: 90 mg/dl COMPARISON:  16/06/9603 FINDINGS: Mediastinal blood pool activity: SUV max 2.0 Liver activity: SUV max NA NECK: Persistent hypermetabolic activity at the LEFT base of tongue localizing to a cavitary lesion (14 mm x 15 mm which is decreased in size from comparison CT 07/04/2022 but remains hyperintense with SUV max equal 5.0. Lesions decreased significant size from PET-CT scan where lesion measures proximally 23 x 21 mm with SUV max equal 9.8. No hypermetabolic cervical lymph nodes within the neck. No hypermetabolic submental nodes. Hypermetabolic activity  associated with the RIGHT mandible (image 21) is favored odontogenic. Incidental CT findings: None. CHEST: No hypermetabolic mediastinal or hilar nodes. No suspicious pulmonary nodules on the CT scan. Incidental CT findings: Port in the anterior chest wall with tip in distal SVC. ABDOMEN/PELVIS: No abnormal hypermetabolic activity within the liver, pancreas, adrenal glands, or spleen. No hypermetabolic lymph nodes in the abdomen or pelvis. Incidental CT findings: Probable small RIGHT bladder diverticulum. SKELETON: No focal hypermetabolic activity to suggest skeletal metastasis. Incidental CT findings: Severe degenerative disc disease at L5-S1. IMPRESSION: 1. Persistent hypermetabolic cavitary lesions at the LEFT base of tongue. Differential includes residual carcinoma versus post procedural inflammation/infection. Activity is more intense than typical post therapy inflammation. 2. No evidence of metastatic adenopathy in head neck. 3. No distant metastatic disease. Electronically Signed   By: Genevive Bi M.D.   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  Patient completed weekly cisplatin along with daily XRT on March 22, 2023.  She reports a recent ENT exam is normal.  PET scan results from Jan 25, 2023 reviewed independently and report as above with residual hypermetabolism, but given a recent normal endoscopy, this is likely inflammation.  No intervention is needed.  Continue follow-up with ENT as scheduled.  Return to clinic in 3 months with repeat imaging using CT scan and further evaluation.       Pain: Patient does not complain of this today. IV access: Patient now has a port. Dysphagia: Resolved. Leukopenia: Resolved.   Patient expressed understanding  and was in agreement with this plan. She also understands that She can call clinic at any time with any questions, concerns, or complaints.    Cancer Staging  Tongue cancer [facility]) Staging form: Oral Cavity, AJCC 8th Edition - Clinical stage from 08/05/2022: Stage III (cT3, cN1, cM0) - Signed by Jeralyn Ruths, MD on 08/05/2022 Stage prefix: Initial diagnosis   Jeralyn Ruths, MD   01/26/2023 10:55 AM

## 2023-01-26 NOTE — Progress Notes (Signed)
Pt. Is having some pain in her left jaw under her tongue. She rates her pain (4). She want to discuss her labs that she had done on Monday.

## 2023-01-27 ENCOUNTER — Other Ambulatory Visit: Payer: Self-pay

## 2023-02-11 ENCOUNTER — Other Ambulatory Visit: Payer: Self-pay

## 2023-03-07 ENCOUNTER — Telehealth: Payer: Self-pay | Admitting: *Deleted

## 2023-03-07 NOTE — Telephone Encounter (Signed)
Patient called reporting that she is having throat pain and the only thing she has is lidocaine. She is asking for medicine for pain until she sees doctor again (which is not until August)

## 2023-03-08 ENCOUNTER — Telehealth: Payer: Self-pay | Admitting: *Deleted

## 2023-03-08 ENCOUNTER — Encounter: Payer: Self-pay | Admitting: Hospice and Palliative Medicine

## 2023-03-08 ENCOUNTER — Other Ambulatory Visit: Payer: Self-pay

## 2023-03-08 ENCOUNTER — Inpatient Hospital Stay: Payer: BC Managed Care – PPO | Attending: Oncology | Admitting: Hospice and Palliative Medicine

## 2023-03-08 VITALS — BP 121/78 | HR 63 | Temp 97.3°F | Resp 16 | Ht 67.0 in | Wt 110.9 lb

## 2023-03-08 DIAGNOSIS — R634 Abnormal weight loss: Secondary | ICD-10-CM | POA: Diagnosis not present

## 2023-03-08 DIAGNOSIS — Z87891 Personal history of nicotine dependence: Secondary | ICD-10-CM | POA: Insufficient documentation

## 2023-03-08 DIAGNOSIS — R519 Headache, unspecified: Secondary | ICD-10-CM | POA: Diagnosis not present

## 2023-03-08 DIAGNOSIS — R630 Anorexia: Secondary | ICD-10-CM | POA: Insufficient documentation

## 2023-03-08 DIAGNOSIS — R6884 Jaw pain: Secondary | ICD-10-CM | POA: Diagnosis not present

## 2023-03-08 DIAGNOSIS — Z452 Encounter for adjustment and management of vascular access device: Secondary | ICD-10-CM | POA: Insufficient documentation

## 2023-03-08 DIAGNOSIS — C01 Malignant neoplasm of base of tongue: Secondary | ICD-10-CM | POA: Insufficient documentation

## 2023-03-08 DIAGNOSIS — C029 Malignant neoplasm of tongue, unspecified: Secondary | ICD-10-CM | POA: Diagnosis not present

## 2023-03-08 MED ORDER — LIDOCAINE VISCOUS HCL 2 % MT SOLN: 15.0000 mL | Freq: Four times a day (QID) | OROMUCOSAL | 0 refills | Status: AC | PRN

## 2023-03-08 MED ORDER — TRAMADOL HCL 50 MG PO TABS: 50.0000 mg | ORAL_TABLET | Freq: Two times a day (BID) | ORAL | 0 refills | Status: AC | PRN

## 2023-03-08 NOTE — Telephone Encounter (Signed)
Faxed referral to [facility] [place]/ Dr. Hall Busing office for oral surgeon consult. Fax confirmation [phone]. [phone] 03/07/23

## 2023-03-08 NOTE — Progress Notes (Signed)
CT pre ordering questions Does the patient have history of allergic reaction during injection of intravenous contrast? Is the patient able to get on the exam table without assistance?- YES how much does the patient weigh?- 110 pounds has the patient had a bun and creatine lab work. - no can do this upon arrival? Is the patient diabetic?- NO does the patient have a history of hypertension requiring medication therapy.- NO does the patient have any type of medical device spinal stimulator bladder stimulator pain pump glucose monitor own body injector brain aneurysm clips or any other device or stimulator.- NO has the patient been diagnosed with COVID-19 awaiting results for COVID-19 has the patient been in close contact of living with someone with a confirmed diagnosis of COVID-19?- NO  Additional Ct questions Weight less than 16- NO Weight <120 lbs- yes, 110 pounds Abdominal pelvic surgery in last 6 weeks- NO Prior Bariatric Surgery- NO Clinical concerns for abscess, inflammatory bowel disease, including Chrohn disease- NO History of ovarian cancer, colon cancer, or lymphoma- NO

## 2023-03-08 NOTE — Progress Notes (Addendum)
[facility] [phone] [phone]  Patient Care Team: Patient, [name] as PCP - General (General Practice) Jeralyn Ruths, MD as Consulting Physician (Oncology)   NAME OF PATIENT: Holly Vega  [id number]  04-Mar-1976   DATE OF VISIT: 03/08/23  REASON FOR CONSULT: Holly Vega is a 47 y.o. female with multiple medical problems including stage III squamous cell carcinoma of the left base of tongue.  Patient is status post concurrent cisplatin and radiation  INTERVAL HISTORY: Patient had PET scan on 01/23/2023 with findings of persistent hypermetabolic cavitary lesions left base of tongue with differential including residual carcinoma versus postprocedural inflammation/infection.  Patient had subsequent follow-up with ENT -Dr. Elenore Rota on 02/15/2023 at which time patient was complaining of increased oral/throat pain.  No sign of recurrence was noted.  Patient was found to have an infected tooth and was started on Augmentin and Norco with recommendation to have follow-up with her dentist for tooth extraction.  Patient presents [facility] today for evaluation of oral/throat pain.  Patient denies fever or chills.  She says that she is having pain left base of the mouth extending into her throat.  She continues to endorse trismus, poor oral intake and has had weight loss.  Denies any neurologic complaints. Denies recent fevers or illnesses. Denies any easy bleeding or bruising. Denies chest pain. Denies any nausea, vomiting, constipation, or diarrhea. Denies urinary complaints. Patient offers no further specific complaints today.   PAST MEDICAL HISTORY: Past Medical History:  Diagnosis Date   Depression    Difficult intubation    Endometriosis    Tongue cancer (HCC)     PAST SURGICAL HISTORY:  Past Surgical History:  Procedure Laterality Date   ABDOMINAL HYSTERECTOMY  11/20/2018   [facility]   APPENDECTOMY   11/20/2018   [facility]   COLON RESECTION     DIRECT LARYNGOSCOPY N/A 07/21/2022   Procedure: DIRECT MICROLARYNGOSCOPY;  Surgeon: Vernie Murders, MD;  Location: [facility];  Service: ENT;  Laterality: N/A;   IR IMAGING GUIDED PORT INSERTION  09/29/2022   LAPAROSCOPY  01/25/2018   Procedure: LAPAROSCOPY DIAGNOSTIC;  Surgeon: Conard Novak, MD;  Location: [facility] ORS;  Service: Gynecology;;   TONGUE BIOPSY N/A 07/21/2022   Procedure: TONGUE BASE BIOPSY;  Surgeon: Vernie Murders, MD;  Location: [facility];  Service: ENT;  Laterality: N/A;    HEMATOLOGY/ONCOLOGY HISTORY:  Oncology History  Tongue cancer (HCC)  08/05/2022 Initial Diagnosis   Tongue cancer (HCC)   08/05/2022 Cancer Staging   Staging form: Oral Cavity, AJCC 8th Edition - Clinical stage from 08/05/2022: Stage III (cT3, cN1, cM0) - Signed by Jeralyn Ruths, MD on 08/05/2022 Stage prefix: Initial diagnosis   08/30/2022 -  Chemotherapy   Patient is on Treatment Plan : HEAD/NECK Cisplatin (40) q7d       ALLERGIES:  is allergic to chlorhexidine gluconate.  MEDICATIONS:  Current Outpatient Medications  Medication Sig Dispense Refill   lidocaine-prilocaine (EMLA) cream Apply 1 Application topically as needed. (Patient not taking: Reported on 03/08/2023) 30 g 0   No current facility-administered medications for this visit.    VITAL SIGNS: BP 121/78   Pulse 63   Temp (!) 97.3 F (36.3 C) (Tympanic)   Resp 16   Ht 5\' 7"  (1.702 m)   Wt 110 lb 14.4 oz (50.3 kg)   LMP 01/28/2018 (Exact Date)   BMI 17.37 kg/m  Filed Weights   03/08/23 1107  Weight:  110 lb 14.4 oz (50.3 kg)    Estimated body mass index is 17.37 kg/m as calculated from the following:   Height as of this encounter: 5\' 7"  (1.702 m).   Weight as of this encounter: 110 lb 14.4 oz (50.3 kg).  LABS: CBC:    Component Value Date/Time   WBC 4.0 01/23/2023 0807   WBC 3.0 (L) 11/22/2022 1028   HGB 11.9 (L) 01/23/2023 0807   HCT 35.0 (L)  01/23/2023 0807   PLT 179 01/23/2023 0807   MCV 99.2 01/23/2023 0807   NEUTROABS 3.0 01/23/2023 0807   LYMPHSABS 0.5 (L) 01/23/2023 0807   MONOABS 0.3 01/23/2023 0807   EOSABS 0.2 01/23/2023 0807   BASOSABS 0.0 01/23/2023 0807   Comprehensive Metabolic Panel:    Component Value Date/Time   NA 135 01/23/2023 0807   K 3.6 01/23/2023 0807   CL 103 01/23/2023 0807   CO2 25 01/23/2023 0807   BUN 10 01/23/2023 0807   CREATININE 0.68 01/23/2023 0807   GLUCOSE 99 01/23/2023 0807   CALCIUM 9.6 01/23/2023 0807   AST 16 01/23/2023 0807   ALT 9 01/23/2023 0807   ALKPHOS 57 01/23/2023 0807   BILITOT 0.5 01/23/2023 0807   PROT 7.3 01/23/2023 0807   ALBUMIN 4.4 01/23/2023 0807    RADIOGRAPHIC STUDIES: No results found.  PERFORMANCE STATUS (ECOG) : 1 - Symptomatic but completely ambulatory  Review of Systems Unless otherwise noted, a complete review of systems is negative.  Physical Exam General: NAD HEENT: No masses appreciated neck, unable to fully assess OP given severe trismus, poor dentition noted.  Cardiovascular: regular rate and rhythm Pulmonary: clear ant fields Abdomen: soft, nontender, + bowel sounds GU: no suprapubic tenderness Extremities: no edema, no joint deformities Skin: no rashes Neurological: Weakness but otherwise nonfocal  IMPRESSION/PLAN: Oral/neck pain -patient had recent follow-up with ENT without evidence of disease recurrence.  However, patient endorses persistent pain.  She was noted to have dental abscess and received course of Augmentin for that.  Patient has not established care with a dentist as recommended by Dr. Elenore Rota.  No specific evidence of dental abscess today.  Patient has poor dentition likely would benefit from teeth extraction and this certainly could be the primary etiology of her pain.  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  Discussed with Dr. Orlie Dakin and will refer  patient to oral surgeon.  Will also obtain maxillofacial and neck CTs for further evaluation.  Agreed to provide one-time prescriptions for viscous lidocaine and oral tramadol until patient can establish care with oral surgeon.  Case and plan discussed with Dr. Orlie Dakin.  Patient will return to see Dr. Orlie Dakin to review results of CTs.  Patient expressed understanding and was in agreement with this plan. She also understands that She can call clinic at any time with any questions, concerns, or complaints.   Thank you for allowing me to participate in the care of this very pleasant patient.   Time Total: 20 minutes  Visit consisted of counseling and education dealing with the complex and emotionally intense issues of symptom management in the setting of serious illness.Greater than 50%  of this time was spent counseling and coordinating care related to the above assessment and plan.  Signed by: Laurette Schimke, PhD, NP-C

## 2023-03-10 ENCOUNTER — Ambulatory Visit
Admission: RE | Admit: 2023-03-10 | Discharge: 2023-03-10 | Disposition: A | Discharge status: Home / Self Care | Payer: BC Managed Care – PPO | Source: Ambulatory Visit | Attending: Hospice and Palliative Medicine | Admitting: Hospice and Palliative Medicine

## 2023-03-10 DIAGNOSIS — C029 Malignant neoplasm of tongue, unspecified: Secondary | ICD-10-CM | POA: Diagnosis present

## 2023-03-10 DIAGNOSIS — R519 Headache, unspecified: Secondary | ICD-10-CM | POA: Diagnosis present

## 2023-03-10 MED ORDER — IOHEXOL 300 MG/ML  SOLN
100.0000 mL | Freq: Once | INTRAMUSCULAR | Status: AC | PRN
  Administered 2023-03-10: 100 mL via INTRAVENOUS

## 2023-03-15 ENCOUNTER — Inpatient Hospital Stay: Payer: BC Managed Care – PPO

## 2023-03-15 DIAGNOSIS — C01 Malignant neoplasm of base of tongue: Secondary | ICD-10-CM | POA: Diagnosis not present

## 2023-03-15 DIAGNOSIS — Z95828 Presence of other vascular implants and grafts: Secondary | ICD-10-CM

## 2023-03-15 MED ORDER — HEPARIN SOD (PORK) LOCK FLUSH 100 UNIT/ML IV SOLN
500.0000 [IU] | Freq: Once | INTRAVENOUS | Status: AC
  Administered 2023-03-15: 500 [IU] via INTRAVENOUS
  Filled 2023-03-15: qty 5

## 2023-03-15 MED ORDER — SODIUM CHLORIDE 0.9% FLUSH
10.0000 mL | Freq: Once | INTRAVENOUS | Status: AC
  Administered 2023-03-15: 10 mL via INTRAVENOUS
  Filled 2023-03-15: qty 10

## 2023-03-16 ENCOUNTER — Encounter: Payer: Self-pay | Admitting: Oncology

## 2023-03-17 ENCOUNTER — Encounter: Payer: Self-pay | Admitting: Oncology

## 2023-03-17 ENCOUNTER — Other Ambulatory Visit: Payer: Self-pay

## 2023-03-17 ENCOUNTER — Inpatient Hospital Stay (HOSPITAL_BASED_OUTPATIENT_CLINIC_OR_DEPARTMENT_OTHER): Payer: BC Managed Care – PPO | Admitting: Oncology

## 2023-03-17 VITALS — BP 119/65 | HR 71 | Temp 96.5°F | Resp 16 | Ht 67.0 in | Wt 107.0 lb

## 2023-03-17 DIAGNOSIS — R634 Abnormal weight loss: Secondary | ICD-10-CM

## 2023-03-17 DIAGNOSIS — C029 Malignant neoplasm of tongue, unspecified: Secondary | ICD-10-CM | POA: Diagnosis not present

## 2023-03-17 DIAGNOSIS — C01 Malignant neoplasm of base of tongue: Secondary | ICD-10-CM | POA: Diagnosis not present

## 2023-03-17 NOTE — Progress Notes (Signed)
Concerned about weight loss with appetite being much better.

## 2023-03-17 NOTE — Progress Notes (Signed)
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  INTERVAL HISTORY: Patient returns to clinic today for further evaluation and discussion of her imaging results.  She continues to have significant left jaw pain and trismus, but admits that has improved since initiating antibiotics.  Evaluation by ENT did not reveal any evidence of recurrence and her symptoms are likely due to infected dentition.  She continues to have a poor appetite. She denies any dysphagia.  She has no neurologic complaints.  She denies any fevers.  She has no chest pain, shortness of breath, cough, or hemoptysis.  She denies any nausea, vomiting, constipation, or diarrhea.  She has no urinary complaints.  Patient offers no further specific complaints today.  REVIEW OF SYSTEMS:   Review of Systems  Constitutional:  Positive for weight loss. Negative for fever and malaise/fatigue.  HENT:  Negative for sore throat.        Jaw pain  Respiratory: Negative.  Negative for cough, hemoptysis and shortness of breath.   Cardiovascular: Negative.  Negative for chest pain and leg swelling.  Gastrointestinal: Negative.  Negative for abdominal pain.  Genitourinary: Negative.  Negative for dysuria.  Musculoskeletal:  Negative for neck pain.  Skin: Negative.  Negative for rash.  Neurological: Negative.  Negative for dizziness, focal weakness, weakness and headaches.  Psychiatric/Behavioral: Negative.  The patient is not nervous/anxious.     As per HPI. Otherwise, a complete review of systems is negative.  PAST MEDICAL HISTORY: Past Medical History:  Diagnosis Date   Depression    Difficult intubation     Endometriosis    Tongue cancer [facility])     PAST SURGICAL HISTORY: Past Surgical History:  Procedure Laterality Date   ABDOMINAL HYSTERECTOMY  11/20/2018   [facility]   APPENDECTOMY  11/20/2018   [facility]   COLON RESECTION     DIRECT LARYNGOSCOPY N/A 07/21/2022   Procedure: DIRECT MICROLARYNGOSCOPY;  Surgeon: Vernie Murders, MD;  Location: [facility];  Service: ENT;  Laterality: N/A;   IR IMAGING GUIDED PORT INSERTION  09/29/2022   LAPAROSCOPY  01/25/2018   Procedure: LAPAROSCOPY DIAGNOSTIC;  Surgeon: Conard Novak, MD;  Location: [facility] ORS;  Service: Gynecology;;   TONGUE BIOPSY N/A 07/21/2022   Procedure: TONGUE BASE BIOPSY;  Surgeon: Vernie Murders, MD;  Location: [facility];  Service: ENT;  Laterality: N/A;    FAMILY HISTORY: Family History  Problem Relation Age of Onset   Cancer Maternal Uncle     ADVANCED DIRECTIVES (Y/N):  N  HEALTH MAINTENANCE: Social History   Tobacco Use   Smoking status: Former    Packs/day: 0.75    Years: 30.00    Additional pack years: 0.00    Total pack years: 22.50    Types: Cigarettes    Quit date: 07/05/2022    Years since quitting: 0.6   Smokeless tobacco: Never   Tobacco comments:       Vaping Use   Vaping Use: Never used  Substance Use Topics   Alcohol use: No   Drug use: No     Colonoscopy:  PAP:  Bone density:  Lipid panel:  Allergies  Allergen Reactions   Chlorhexidine Gluconate Rash    Current Outpatient Medications  Medication Sig Dispense Refill   lidocaine (XYLOCAINE) 2 % solution Use as directed 15 mLs in the mouth or throat every 6 (six) hours as needed for mouth pain. 250 mL 0   traMADol (ULTRAM) 50 MG tablet Take 1 tablet (50 mg total) by mouth every 12 (twelve) hours as needed. 30 tablet 0   HYDROcodone-acetaminophen (NORCO/VICODIN) 5-325 MG tablet Take 1 tablet by mouth every 4 (four) hours as needed. (Patient not taking: Reported on 03/17/2023)     lidocaine-prilocaine (EMLA) cream Apply 1  Application topically as needed. (Patient not taking: Reported on 03/08/2023) 30 g 0   No current facility-administered medications for this visit.    OBJECTIVE: Vitals:   03/17/23 0953  BP: 119/65  Pulse: 71  Resp: 16  Temp: (!) 96.5 F (35.8 C)  SpO2: 100%     Body mass index is 16.76 kg/m.    ECOG FS:0 - Asymptomatic  General: Thin, no acute distress. Eyes: Pink conjunctiva, anicteric sclera. HEENT: Difficult to examine with underlying trismus.  No palpable lymphadenopathy. Lungs: No audible wheezing or coughing. Heart: Regular rate and rhythm. Abdomen: Soft, nontender, no obvious distention. Musculoskeletal: No edema, cyanosis, or clubbing. Neuro: Alert, answering all questions appropriately. Cranial nerves grossly intact. Skin: No rashes or petechiae noted. Psych: Normal affect.   LAB RESULTS:  Lab Results  Component Value Date   NA 135 01/23/2023   K 3.6 01/23/2023   CL 103 01/23/2023   CO2 25 01/23/2023   GLUCOSE 99 01/23/2023   BUN 10 01/23/2023   CREATININE 0.68 01/23/2023   CALCIUM 9.6 01/23/2023   PROT 7.3 01/23/2023   ALBUMIN 4.4 01/23/2023   AST 16 01/23/2023   ALT 9 01/23/2023   ALKPHOS 57 01/23/2023   BILITOT 0.5 01/23/2023   GFRNONAA >60 01/23/2023   GFRAA >60 01/15/2018    Lab Results  Component Value Date   WBC 4.0 01/23/2023   NEUTROABS 3.0 01/23/2023   HGB 11.9 (L) 01/23/2023   HCT 35.0 (L) 01/23/2023   MCV 99.2 01/23/2023   PLT 179 01/23/2023     STUDIES: No results found.  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  Patient completed weekly cisplatin along with daily XRT on March 22, 2023.  Patient saw ENT earlier this week with exam revealing no evidence of disease.  Repeat CT scans from March 10, 2023 reviewed  independently and report as above with no obvious evidence of recurrence.  Patient's symptoms are likely secondary to dental infection and a referral has been made to oral surgery.  She has been instructed to keep her previously scheduled follow-up in August.  Continue follow-up with ENT as scheduled.   Pain: Continue tramadol.  Referral to oral surgery as above.   IV access: Patient now has a port. Poor appetite/weight loss: Patient was given a referral back to dietary.  I spent a total of 30 minutes reviewing chart data, face-to-face evaluation with the patient, counseling and coordination of care as detailed above.    Patient expressed understanding and was in agreement with this plan. She also understands that She can call clinic at any time with any questions, concerns, or complaints.    Cancer Staging  Tongue cancer (HCC) Staging form: Oral Cavity, AJCC 8th Edition - Clinical stage from  08/05/2022: Stage III (cT3, cN1, cM0) - Signed by Jeralyn Ruths, MD on 08/05/2022 Stage prefix: Initial diagnosis   Jeralyn Ruths, MD   03/17/2023 10:07 AM

## 2023-03-23 ENCOUNTER — Inpatient Hospital Stay: Payer: BC Managed Care – PPO

## 2023-04-04 ENCOUNTER — Inpatient Hospital Stay: Payer: BC Managed Care – PPO | Attending: Oncology

## 2023-04-04 NOTE — Progress Notes (Addendum)
Nutrition Follow-up:  Patient referred back to nutrition for weight loss.    47 year old female who has completed treatment of stage III left tongue base cancer, p 16+.  Past medical history of depression, endometriosis.  Completed chemotherapy and radiation in Jan 2024.    Met with patient in clinic.  Reports that her taste is coming back.  Has had significant left jaw pain and trismus (worse now than when working with SLP).  About 10 days ago had 2 teeth removed and has been on antibiotics.  Pain from ear, along jaw bone and when swallowing are effecting eating.  States that every time she swallows it feels like she has a sore throat. Typical day she has a pure protein shake.  Eats a bowl of cream of wheat mixed with whole milk, butter or oatmeal around 9:30-10:30 am and water.  Noon she has yogurt or pudding or jello.  Around 3 pm has dinner meal (spaghetti, chicken and dumplings).  Around 5-6 pm has dessert of cheesecake, ice cream, custard.  Able to drink mostly water, gatorade.    Orbital Region: moderate Buccal Region: moderate Upper Arm Region: severe Thoracic and Lumbar Region: severe Temple Region: severe Clavicle Bone Region: severe Shoulder and Acromion Bone Region: severe Scapular Bone Region: severe Dorsal Hand: WNL Patellar Region: mild Anterior Thigh Region: moderate Posterior Calf Region: WNL Edema (RD assessment): none    Medications: ibuprofen   Labs: reviewed  Anthropometrics:   Height: 67 inches Weight: 108 lb 2 oz 134 lb on 10/21/22 BMI: 16  19% weight loss in the last 5 months, significant   Estimated Energy Needs  Kcals: 1400-1600 Protein: 72-84 g Fluid: 1400-1600 ml  NUTRITION DIAGNOSIS: Inadequate oral intake related to jaw pain, pain on swallowing (cancer related treatment side effects) as evidenced by 19% weight loss in the last 5 months, severe fat loss and muscle mass loss.     MALNUTRITION DIAGNOSIS: Patient meets criteria of severe  malnutrition in context of chronic illness as evidenced by 19% weight loss in the last 5 months and severe fat loss and muscle mass loss.     INTERVENTION:  Recommend 350 calorie shake or higher to replace low calorie shake. Samples of boost VHC and ensure complete given with coupons Discussed ways to add calories and protein in diet. High Calorie, High Protein Diet handout given.  Recommend 3 meals (2-3 different food items) and 2 snacks (1-2 food items) daily Encouraged patient to keep food diary and track calories consumed daily compared to goal.   Recommend patient reestablish care with SLP.  Message sent to SLP Spoke with Sharia Reeve, Palliative NP and Dr Orlie Dakin and recommend patient follow up with oral surgeon regarding pain management.    Contact information provided  MONITORING, EVALUATION, GOAL: weight trends, intake   NEXT VISIT: phone call, August 13th  Evola Hollis B. Freida Busman, RD, LDN Registered Dietitian [phone]

## 2023-04-05 ENCOUNTER — Other Ambulatory Visit: Payer: Self-pay

## 2023-04-06 ENCOUNTER — Encounter: Payer: Self-pay | Admitting: Radiation Oncology

## 2023-04-06 ENCOUNTER — Ambulatory Visit
Admission: RE | Admit: 2023-04-06 | Discharge: 2023-04-06 | Disposition: A | Discharge status: Home / Self Care | Payer: BC Managed Care – PPO | Source: Ambulatory Visit | Attending: Radiation Oncology | Admitting: Radiation Oncology

## 2023-04-06 ENCOUNTER — Ambulatory Visit: Payer: BC Managed Care – PPO | Attending: Oncology | Admitting: Speech Pathology

## 2023-04-06 VITALS — BP 115/84 | HR 75 | Temp 98.4°F | Resp 14 | Ht 67.0 in | Wt 110.5 lb

## 2023-04-06 DIAGNOSIS — R1312 Dysphagia, oropharyngeal phase: Secondary | ICD-10-CM | POA: Insufficient documentation

## 2023-04-06 DIAGNOSIS — Z923 Personal history of irradiation: Secondary | ICD-10-CM | POA: Diagnosis not present

## 2023-04-06 DIAGNOSIS — R252 Cramp and spasm: Secondary | ICD-10-CM | POA: Insufficient documentation

## 2023-04-06 DIAGNOSIS — R1310 Dysphagia, unspecified: Secondary | ICD-10-CM | POA: Diagnosis not present

## 2023-04-06 DIAGNOSIS — C029 Malignant neoplasm of tongue, unspecified: Secondary | ICD-10-CM | POA: Diagnosis present

## 2023-04-06 DIAGNOSIS — M542 Cervicalgia: Secondary | ICD-10-CM | POA: Diagnosis not present

## 2023-04-06 DIAGNOSIS — Z9221 Personal history of antineoplastic chemotherapy: Secondary | ICD-10-CM | POA: Diagnosis not present

## 2023-04-06 DIAGNOSIS — C01 Malignant neoplasm of base of tongue: Secondary | ICD-10-CM | POA: Insufficient documentation

## 2023-04-06 DIAGNOSIS — R1313 Dysphagia, pharyngeal phase: Secondary | ICD-10-CM | POA: Diagnosis present

## 2023-04-06 NOTE — Therapy (Unsigned)
OUTPATIENT SPEECH LANGUAGE PATHOLOGY TREATMENT NOTE RE-CERTIFICATION REQUEST   Patient Name: Holly Vega MRN: [id number] DOB:Aug 01, 1976, 47 y.o., female Today's Date: 04/06/2023  PCP: none in chart REFERRING PROVIDER: Gerarda Fraction, MD  END OF SESSION:   End of Session - 04/06/23 1633     Visit Number 4    Number of Visits 20    Date for SLP Re-Evaluation 06/01/23    Authorization Type BCBS COMM PPO    Authorization - Visit Number 4    Authorization - Number of Visits 30    Progress Note Due on Visit 10    SLP Start Time 1410    SLP Stop Time  1445    SLP Time Calculation (min) 35 min    Activity Tolerance Patient limited by pain             Past Medical History:  Diagnosis Date   Depression    Difficult intubation    Endometriosis    Tongue cancer (HCC)    Past Surgical History:  Procedure Laterality Date   ABDOMINAL HYSTERECTOMY  11/20/2018   [facility]   APPENDECTOMY  11/20/2018   [facility]   COLON RESECTION     DIRECT LARYNGOSCOPY N/A 07/21/2022   Procedure: DIRECT MICROLARYNGOSCOPY;  Surgeon: Vernie Murders, MD;  Location: [facility];  Service: ENT;  Laterality: N/A;   IR IMAGING GUIDED PORT INSERTION  09/29/2022   LAPAROSCOPY  01/25/2018   Procedure: LAPAROSCOPY DIAGNOSTIC;  Surgeon: Conard Novak, MD;  Location: [facility];  Service: Gynecology;;   TONGUE BIOPSY N/A 07/21/2022   Procedure: TONGUE BASE BIOPSY;  Surgeon: Vernie Murders, MD;  Location: [facility];  Service: ENT;  Laterality: N/A;   Patient Active Problem List   Diagnosis Date Noted   Tongue cancer (HCC) 08/05/2022   Hormone replacement therapy 12/24/2018   Rectal endometriosis 10/24/2018   Endometriosis determined by laparoscopy 01/25/2018   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  Per ED chart, pt states that she has had "trouble opening her mouth for the past 4 months."  CT neck on 07/04/2022 revealed a 2.8 x 2.2 x 3.1 cm peripherally enhancing mass in the left retromolar trigone area with gas concerning for a primary squamous cell carcinoma with lymph nodes that appear to be concerning for metastatic disease.  ENT biopsied area on 07/21/2022 and per secure chat with ENT he reports "I believe the trismus was because of the cancer causing pain/inflammation in her pterygoids, so she didn't open her mouth much or stretch it, so it just got tighter. We had to put her asleep with muscle relaxants for me to finally stretch the muscles open again so I could get in her mouth to biopsy this area. 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  PET scan results from August 10, 2022 reviewed independently with hypermetabolic left base of tongue mass and minimally metabolic lymph node unclear if this is a metastasis or  reactive. Pt received  7 weeks doses of cisplatin 40 mg/m2 with daily XRT (70 Gy) ending on 11/02/2022    THERAPY DIAG:  Dysphagia, oropharyngeal phase  Tongue cancer (HCC)  Trismus  Rationale for Evaluation and Treatment Rehabilitation  SUBJECTIVE: pt continues to struggle with lack of taste and weight loss  Pt accompanied by: self  PAIN:  Are you having pain? No  PATIENT GOALS: to be able to eat/open her jaw   OBJECTIVE:   TODAY'S TREATMENT:  During today's re-evaluation, pt presents with profound trismus. When attempting opening her mouth, her front teeth continue overlapping her bottom teeth by 5  mm. SLP introduced very mild active range of motion exercises with tongue depressor. Pt able to tolerate briefly(2- seconds) d/t 7 out of 10 pain. Pt able to lateralize her jaw with no increase in pain as well as clinch jaw.   Extensive education provided on severity of trismus as well as overall health hazard of trismus.    PATIENT EDUCATION: Education details: see above Person educated: Patient Education method: Solicitor, Verbal cues, and Handouts Education comprehension: verbalized understanding and needs further education  HOME EXERCISE PROGRAM:  Complete pharyngeal and jaw strengthening exercises  GOALS: Goals reviewed with patient? Yes   SHORT TERM GOALS: Target date: 10 sessions   Pt will complete HEP with rare min A Baseline: Goal status: INITIAL; re-established 2. The patient will increase jaw ROM by 10 mm through daily passive and active jaw stretching exercises in order to safely return to PO diet.    Baseline: profound  Goal status: INITIAL 3. The patient will advance from performing active jaw stretching exercises using a guaze pad to using a chewy tube in order to achieve improved masticatory skills for diet upgrade candidacy.    Baseline: unable  Goal Status: INITIAL       LONG TERM GOALS: Target date: 06/01/2023   Pt will describe how to modify HEP over time, and the timeline associated with reduction in HEP frequency with modified independence over two sessions Baseline:  Goal status: INITIAL; re-established       2. Pt will be able to verbalize understanding of a home exercise program for pharyngeal, trismus and cervical range of motion.     Baseline:    Goal status: INITIAL  3. Patient will consume recommended diet using strategies and compensations without overt s/sx aspiration >95% of the time.     Baseline:   Goal status: INITIAL        ASSESSMENT:  CLINICAL IMPRESSION: Pt is a 47 year old female who was seen today for  re-evaluation of trismus, dysphagia and dysarthria. Pt presents with worsening trismus likely related to recent dental pain. Per pt report, pt had her wisdom tooth and last molar on her left removed via oral surgery several weeks ago. Pt is currently limited by pain. Plan made for SLP to reach out to oral surgeon for notes and for possible treatment for dental related pain.   OBJECTIVE IMPAIRMENTS include dysphagia. These impairments are limiting patient from effectively communicating at home and in community and safety when swallowing. Factors affecting potential to achieve goals and functional outcome are medical prognosis. Patient will benefit from skilled SLP services to address above impairments and improve overall function.  REHAB POTENTIAL: Good  PLAN: SLP FREQUENCY: 1-2x/week  SLP DURATION: 10 weeks  PLANNED INTERVENTIONS: Aspiration precaution training, Pharyngeal strengthening exercises, SLP instruction and feedback, Compensatory strategies, and Patient/family education   Owain Eckerman B. Dreama Saa, M.S., CCC-SLP,  Warehouse manager Injury Specialist [facility]  [facility] Office [phone] Ascom [phone] Fax [phone]

## 2023-04-06 NOTE — Progress Notes (Signed)
Radiation Oncology Follow up Note  Name: Holly Vega   Date:   04/06/2023 MRN:  [id number] DOB: August 20, 1976    This 47 y.o. 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  REFERRING PROVIDER: No ref. provider found  HPI: Patient is a 47 year old female now out 5 months having completed concurrent chemoradiation therapy for stage IIIa squamous cell carcinoma of the tongue base.  She continues to have some dysphagia and head and neck pain.  She saw an oral surgeon had some teeth removed although pain persists.  She does have significant trismus..  Her last PET scan was back in April and showed persistent hypermetabolic activity in the cavitary lesion at the left base of tongue.  Last month she had a CT scan showing mild mucosal enhancement the left base of tongue with recommended short interval follow-up.  She is been seen on a monthly basis by ENT showing no evidence of disease at the tongue base on endoscopy.  She has been prescribed some oral tramadol by symptom management. COMPLICATIONS OF TREATMENT: none  FOLLOW UP COMPLIANCE: keeps appointments   PHYSICAL EXAM:  BP 115/84   Pulse 75   Temp 98.4 F (36.9 C)   Resp 14   Ht 5\' 7"  (1.702 m)   Wt 110 lb 8 oz (50.1 kg)   LMP 01/28/2018 (Exact Date)   BMI 17.31 kg/m  Patient has severe trismus making examination of her oral cavity base of tongue and possible.  Neck is clear without evidence of cervical or supraclavicular adenopathy.  Well-developed well-nourished patient in NAD. HEENT reveals PERLA, EOMI, discs not visualized.  Oral cavity is clear. No oral mucosal lesions are identified. Neck is clear without evidence of cervical or supraclavicular adenopathy. Lungs are clear to A&P. Cardiac examination is essentially unremarkable with regular  rate and rhythm without murmur rub or thrill. Abdomen is benign with no organomegaly or masses noted. Motor sensory and DTR levels are equal and symmetric in the upper and lower extremities. Cranial nerves II through XII are grossly intact. Proprioception is intact. No peripheral adenopathy or edema is identified. No motor or sensory levels are noted. Crude visual fields are within normal range.  RADIOLOGY RESULTS: PET CT and CT scans reviewed compatible with above-stated findings  PLAN: At this time will turn follow-up care over to ENT.  We are unable to do perform an oral examination on her based on her trismus.  I will be happy to reevaluate her anytime should that be indicated.  She continues close surveillance by both medical oncology and ENT with follow-up CT scans ordered by them.  Patient is to call with any concerns.  I would like to take this opportunity to thank you for allowing me to participate in the care of your patient.Carmina Miller, MD

## 2023-04-07 ENCOUNTER — Other Ambulatory Visit: Payer: Self-pay

## 2023-04-11 ENCOUNTER — Ambulatory Visit: Payer: BC Managed Care – PPO | Admitting: Speech Pathology

## 2023-04-13 ENCOUNTER — Ambulatory Visit: Payer: BC Managed Care – PPO | Admitting: Speech Pathology

## 2023-04-18 ENCOUNTER — Ambulatory Visit: Payer: BC Managed Care – PPO | Admitting: Speech Pathology

## 2023-04-18 DIAGNOSIS — R1312 Dysphagia, oropharyngeal phase: Secondary | ICD-10-CM | POA: Diagnosis not present

## 2023-04-18 DIAGNOSIS — C029 Malignant neoplasm of tongue, unspecified: Secondary | ICD-10-CM

## 2023-04-18 DIAGNOSIS — R252 Cramp and spasm: Secondary | ICD-10-CM

## 2023-04-18 NOTE — Therapy (Signed)
OUTPATIENT SPEECH LANGUAGE PATHOLOGY TREATMENT NOTE RE-CERTIFICATION REQUEST   Patient Name: Holly Vega MRN: [id number] DOB:08/17/1976, 47 y.o., female Today's Date: 04/18/2023  PCP: none in chart REFERRING PROVIDER: Gerarda Fraction, MD  END OF SESSION:   End of Session - 04/18/23 1223     Visit Number 5    Number of Visits 20    Date for SLP Re-Evaluation 06/01/23    Authorization Type BCBS COMM PPO    Authorization - Visit Number 5    Authorization - Number of Visits 30    Progress Note Due on Visit 10    SLP Start Time 1145    SLP Stop Time  1230    SLP Time Calculation (min) 45 min    Activity Tolerance Patient limited by pain             Past Medical History:  Diagnosis Date   Depression    Difficult intubation    Endometriosis    Tongue cancer (HCC)    Past Surgical History:  Procedure Laterality Date   ABDOMINAL HYSTERECTOMY  11/20/2018   [facility]   APPENDECTOMY  11/20/2018   [facility]   COLON RESECTION     DIRECT LARYNGOSCOPY N/A 07/21/2022   Procedure: DIRECT MICROLARYNGOSCOPY;  Surgeon: Vernie Murders, MD;  Location: [facility];  Service: ENT;  Laterality: N/A;   IR IMAGING GUIDED PORT INSERTION  09/29/2022   LAPAROSCOPY  01/25/2018   Procedure: LAPAROSCOPY DIAGNOSTIC;  Surgeon: Conard Novak, MD;  Location: [facility];  Service: Gynecology;;   TONGUE BIOPSY N/A 07/21/2022   Procedure: TONGUE BASE BIOPSY;  Surgeon: Vernie Murders, MD;  Location: [facility];  Service: ENT;  Laterality: N/A;   Patient Active Problem List   Diagnosis Date Noted   Tongue cancer (HCC) 08/05/2022   Hormone replacement therapy 12/24/2018   Rectal endometriosis 10/24/2018   Endometriosis determined by laparoscopy 01/25/2018   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 ED.  Per ED chart, pt states that she has had "trouble opening her mouth for the past 4 months."  CT neck on 07/04/2022 revealed a 2.8 x 2.2 x 3.1 cm peripherally enhancing mass in the left retromolar trigone area with gas concerning for a primary squamous cell carcinoma with lymph nodes that appear to be concerning for metastatic disease.  ENT biopsied area on 07/21/2022 and per secure chat with ENT he reports "I believe the trismus was because of the cancer causing pain/inflammation in her pterygoids, so she didn't open her mouth much or stretch it, so it just got tighter. We had to put her asleep with muscle relaxants for me to finally stretch the muscles open again so I could get in her mouth to biopsy this area. 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  PET scan results from August 10, 2022 reviewed independently with hypermetabolic left base of tongue mass and minimally metabolic lymph node unclear if this is a metastasis or  reactive. Pt received  7 weeks doses of cisplatin 40 mg/m2 with daily XRT (70 Gy) ending on 11/02/2022    THERAPY DIAG:  Dysphagia, oropharyngeal phase  Tongue cancer (HCC)  Trismus  Rationale for Evaluation and Treatment Rehabilitation  SUBJECTIVE: Pt presents as more encouraged today "I can put my tongue between my front teeth now"  Pt accompanied by: self  PAIN:  Are you having pain? No  PATIENT GOALS: to be able to eat/open her jaw   OBJECTIVE:   TODAY'S TREATMENT:  Skilled treatment session focused on pt's trismus and resulting oral phase dysphagia. SLP facilitated session by providing the  following interventions:   Pt presents to session demonstrating increased MIO by placing her tongue between her teeth. She also reports recent appt with ENT (Dr Vernie Murders) who was able ot provide some answers related to pt's odynophagia.   SLP facilitated session by having pt place heat to left jaw for 10 minutes, followed by neck/cervical stretches.   Extensive education provided that Neck range of motion exercises should be done to the point of feeling a GENTLE, TOLERABLE stretch only. Demonstration provided with pt able to imitate for the following stretches:  Head Tilt: Side to Side: Tilt your head to the side, bringing your ear toward your shoulder. Do not raise your shoulder to your ear. Keep your shoulder still. Return your head to the starting position. Mod I   Head turns: Turn your head to look over your shoulder. Tilt your chin down and try to touch it to your shoulder. Do not raise your shoulder to your chin. Face forward again -Mod I    SLP further facilitated passive jaw ROM by creating ARK-J device to achieve gentle stretch of 10 mm MIO.   PATIENT EDUCATION: Education details: see above Person educated: Patient Education method: Solicitor, Verbal cues, and Handouts Education comprehension: verbalized understanding and needs further education  HOME EXERCISE PROGRAM:  Complete pharyngeal and jaw strengthening exercises  GOALS: Goals reviewed with patient? Yes   SHORT TERM GOALS: Target date: 10 sessions   Pt will complete HEP with rare min A Baseline: Goal status: INITIAL; re-established 2. The patient will increase jaw ROM by 10 mm through daily passive and active jaw stretching exercises in order to safely return to PO diet.    Baseline: profound  Goal status: INITIAL 3. The patient will advance from performing active jaw stretching exercises using a guaze pad to using a chewy tube in order to achieve improved masticatory skills for diet upgrade  candidacy.    Baseline: unable  Goal Status: INITIAL       LONG TERM GOALS: Target date: 06/01/2023   Pt will describe how to modify HEP over time, and the timeline associated with reduction in HEP frequency with modified independence over two sessions Baseline:  Goal status: INITIAL; re-established       2. Pt will be able to verbalize understanding of a home exercise program for pharyngeal, trismus and cervical range of motion.     Baseline:    Goal status: INITIAL  3. Patient will consume recommended diet using strategies and compensations without overt s/sx aspiration >95% of the time.     Baseline:   Goal status: INITIAL        ASSESSMENT:  CLINICAL IMPRESSION: Pt is a 47 year old female who was seen today for re-evaluation of trismus, dysphagia and dysarthria.Pt with great progress towards goals. Pt also states that she is eating more and have  gained some weight since last visit. Pt eager to complete all activities with improved MIO over previous session!!! See the above treatment note for details.   OBJECTIVE IMPAIRMENTS include dysphagia. These impairments are limiting patient from effectively communicating at home and in community and safety when swallowing. Factors affecting potential to achieve goals and functional outcome are medical prognosis. Patient will benefit from skilled SLP services to address above impairments and improve overall function.  REHAB POTENTIAL: Good  PLAN: SLP FREQUENCY: 1-2x/week  SLP DURATION: 10 weeks  PLANNED INTERVENTIONS: Aspiration precaution training, Pharyngeal strengthening exercises, SLP instruction and feedback, Compensatory strategies, and Patient/family education   Elia Nunley B. Dreama Saa, M.S., CCC-SLP, Tree surgeon Certified Brain Injury Specialist [facility]  [facility] Office [phone] Ascom [phone] Fax [phone]

## 2023-04-20 ENCOUNTER — Ambulatory Visit: Payer: BC Managed Care – PPO | Admitting: Speech Pathology

## 2023-04-24 ENCOUNTER — Ambulatory Visit: Payer: BC Managed Care – PPO | Admitting: Speech Pathology

## 2023-04-24 DIAGNOSIS — R1313 Dysphagia, pharyngeal phase: Secondary | ICD-10-CM

## 2023-04-24 DIAGNOSIS — C029 Malignant neoplasm of tongue, unspecified: Secondary | ICD-10-CM

## 2023-04-24 DIAGNOSIS — R1312 Dysphagia, oropharyngeal phase: Secondary | ICD-10-CM

## 2023-04-24 DIAGNOSIS — R252 Cramp and spasm: Secondary | ICD-10-CM

## 2023-04-24 NOTE — Therapy (Signed)
OUTPATIENT SPEECH LANGUAGE PATHOLOGY TREATMENT NOTE   Patient Name: Holly Vega MRN: [id number] DOB:[birth year], 47 y.o., female Today's Date: 04/24/2023  PCP: none in chart REFERRING PROVIDER: Gerarda Fraction, MD  END OF SESSION:   End of Session - 04/24/23 1200     Visit Number 6    Number of Visits 20    Date for SLP Re-Evaluation 06/01/23    Authorization Type BCBS COMM PPO    Authorization - Visit Number 6    Authorization - Number of Visits 30    Progress Note Due on Visit 10    SLP Start Time 1155    SLP Stop Time  1230    SLP Time Calculation (min) 35 min    Activity Tolerance Patient tolerated treatment well             Past Medical History:  Diagnosis Date   Depression    Difficult intubation    Endometriosis    Tongue cancer (HCC)    Past Surgical History:  Procedure Laterality Date   ABDOMINAL HYSTERECTOMY  11/20/2018   [facility]   APPENDECTOMY  11/20/2018   [facility]   COLON RESECTION     DIRECT LARYNGOSCOPY N/A 07/21/2022   Procedure: DIRECT MICROLARYNGOSCOPY;  Surgeon: Vernie Murders, MD;  Location: [facility];  Service: ENT;  Laterality: N/A;   IR IMAGING GUIDED PORT INSERTION  09/29/2022   LAPAROSCOPY  01/25/2018   Procedure: LAPAROSCOPY DIAGNOSTIC;  Surgeon: Conard Novak, MD;  Location: [facility];  Service: Gynecology;;   TONGUE BIOPSY N/A 07/21/2022   Procedure: TONGUE BASE BIOPSY;  Surgeon: Vernie Murders, MD;  Location: [facility];  Service: ENT;  Laterality: N/A;   Patient Active Problem List   Diagnosis Date Noted   Tongue cancer (HCC) 08/05/2022   Hormone replacement therapy 12/24/2018   Rectal endometriosis 10/24/2018   Endometriosis determined by laparoscopy 01/25/2018   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  Per ED chart, pt states that she has had "trouble opening her mouth for the past 4 months."  CT neck on 07/04/2022 revealed a 2.8 x 2.2 x 3.1 cm peripherally enhancing mass in the left retromolar trigone area with gas concerning for a primary squamous cell carcinoma with lymph nodes that appear to be concerning for metastatic disease.  ENT biopsied area on 07/21/2022 and per secure chat with ENT he reports "I believe the trismus was because of the cancer causing pain/inflammation in her pterygoids, so she didn't open her mouth much or stretch it, so it just got tighter. We had to put her asleep with muscle relaxants for me to finally stretch the muscles open again so I could get in her mouth to biopsy this area. 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  PET scan results from August 10, 2022 reviewed independently with hypermetabolic left base of tongue mass and minimally metabolic lymph node unclear if this is a metastasis or reactive. Pt  received  7 weeks doses of cisplatin 40 mg/m2 with daily XRT (70 Gy) ending on 11/02/2022    THERAPY DIAG:  Dysphagia, oropharyngeal phase  Tongue cancer (HCC)  Trismus  Dysphagia, pharyngeal phase  Rationale for Evaluation and Treatment Rehabilitation  SUBJECTIVE: Pt presents as more encouraged today "I can put my tongue between my front teeth now"  Pt accompanied by: self  PAIN:  Are you having pain? No  PATIENT GOALS: to be able to eat/open her jaw   OBJECTIVE:   TODAY'S TREATMENT:  Skilled treatment session focused on pt's trismus and resulting oral phase dysphagia. SLP facilitated session by providing the  following interventions:   SLP facilitated session by having pt place heat to left jaw for 10 minutes, followed by neck/cervical stretches.   SLP further facilitated passive jaw ROM by creating ARK-J device to achieve gentle stretch of 15 mm MIO. Following stretches pt able to place index finger between her front teeth. In addition, she was able to put a thin breakfast bar between teeth and take a bite instead of having to brake off a piece and slide it in from the side. Pt very emotional during this ability.   PATIENT EDUCATION: Education details: see above Person educated: Patient Education method: Solicitor, Verbal cues, and Handouts Education comprehension: verbalized understanding and needs further education  HOME EXERCISE PROGRAM:  Complete pharyngeal and jaw strengthening exercises  GOALS: Goals reviewed with patient? Yes   SHORT TERM GOALS: Target date: 10 sessions   Pt will complete HEP with rare min A Baseline: Goal status: INITIAL; re-established 2. The patient will increase jaw ROM by 10 mm through daily passive and active jaw stretching exercises in order to safely return to PO diet.    Baseline: profound  Goal status: INITIAL 3. The patient will advance from performing active jaw stretching exercises using a guaze pad to using a chewy tube in order to achieve improved masticatory skills for diet upgrade candidacy.    Baseline: unable  Goal Status: INITIAL       LONG TERM GOALS: Target date: 06/01/2023   Pt will describe how to modify HEP over time, and the timeline associated with reduction in HEP frequency with modified independence over two sessions Baseline:  Goal status: INITIAL; re-established       2. Pt will be able to verbalize understanding of a home exercise program for pharyngeal, trismus and cervical range of motion.     Baseline:    Goal status: INITIAL  3. Patient will consume recommended diet using strategies and compensations  without overt s/sx aspiration >95% of the time.     Baseline:   Goal status: INITIAL        ASSESSMENT:  CLINICAL IMPRESSION: Pt is a 47 year old female who was seen today for re-evaluation of trismus, dysphagia and dysarthria.Pt with great progress towards goals. Pt also states that she is eating more and have gained some weight since last visit. Pt eager to complete all activities with improved MIO over previous session!!! See the above treatment note for details.   OBJECTIVE IMPAIRMENTS include dysphagia. These impairments are limiting patient from effectively communicating at home and in community and safety when swallowing. Factors affecting potential to achieve goals and functional outcome are medical prognosis. Patient will benefit from skilled SLP services to address above impairments and improve overall function.  REHAB POTENTIAL: Good  PLAN: SLP FREQUENCY: 1-2x/week  SLP DURATION: 10 weeks  PLANNED INTERVENTIONS: Aspiration precaution training, Pharyngeal strengthening  exercises, SLP instruction and feedback, Compensatory strategies, and Patient/family education   Laylaa Guevarra B. Dreama Saa, M.S., CCC-SLP, Tree surgeon Certified Brain Injury Specialist [facility]  [facility] Rehabilitation Services Office [phone] Ascom [phone] Fax [phone]

## 2023-04-26 ENCOUNTER — Ambulatory Visit: Payer: BC Managed Care – PPO | Admitting: Speech Pathology

## 2023-04-26 DIAGNOSIS — R1312 Dysphagia, oropharyngeal phase: Secondary | ICD-10-CM | POA: Diagnosis not present

## 2023-04-26 DIAGNOSIS — R252 Cramp and spasm: Secondary | ICD-10-CM

## 2023-04-26 DIAGNOSIS — C029 Malignant neoplasm of tongue, unspecified: Secondary | ICD-10-CM

## 2023-04-26 NOTE — Therapy (Signed)
OUTPATIENT SPEECH LANGUAGE PATHOLOGY TREATMENT NOTE   Patient Name: Holly Vega MRN: [id number] DOB:1976-01-29, 47 y.o., female Today's Date: 04/26/2023  PCP: none in chart REFERRING PROVIDER: Gerarda Fraction, MD  END OF SESSION:   End of Session - 04/26/23 1145     Visit Number 7    Number of Visits 20    Date for SLP Re-Evaluation 06/01/23    Authorization Type BCBS COMM PPO    Authorization - Visit Number 7    Authorization - Number of Visits 30    Progress Note Due on Visit 10    SLP Start Time 1145    SLP Stop Time  1230    SLP Time Calculation (min) 45 min    Activity Tolerance Patient tolerated treatment well             Past Medical History:  Diagnosis Date   Depression    Difficult intubation    Endometriosis    Tongue cancer (HCC)    Past Surgical History:  Procedure Laterality Date   ABDOMINAL HYSTERECTOMY  11/20/2018   [facility]   APPENDECTOMY  11/20/2018   [facility]   COLON RESECTION     DIRECT LARYNGOSCOPY N/A 07/21/2022   Procedure: DIRECT MICROLARYNGOSCOPY;  Surgeon: Vernie Murders, MD;  Location: [facility];  Service: ENT;  Laterality: N/A;   IR IMAGING GUIDED PORT INSERTION  09/29/2022   LAPAROSCOPY  01/25/2018   Procedure: LAPAROSCOPY DIAGNOSTIC;  Surgeon: Conard Novak, MD;  Location: [facility];  Service: Gynecology;;   TONGUE BIOPSY N/A 07/21/2022   Procedure: TONGUE BASE BIOPSY;  Surgeon: Vernie Murders, MD;  Location: [facility];  Service: ENT;  Laterality: N/A;   Patient Active Problem List   Diagnosis Date Noted   Tongue cancer (HCC) 08/05/2022   Hormone replacement therapy 12/24/2018   Rectal endometriosis 10/24/2018   Endometriosis determined by laparoscopy 01/25/2018   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 ED.  Per ED chart, pt states that she has had "trouble opening her mouth for the past 4 months."  CT neck on 07/04/2022 revealed a 2.8 x 2.2 x 3.1 cm peripherally enhancing mass in the left retromolar trigone area with gas concerning for a primary squamous cell carcinoma with lymph nodes that appear to be concerning for metastatic disease.  ENT biopsied area on 07/21/2022 and per secure chat with ENT he reports "I believe the trismus was because of the cancer causing pain/inflammation in her pterygoids, so she didn't open her mouth much or stretch it, so it just got tighter. We had to put her asleep with muscle relaxants for me to finally stretch the muscles open again so I could get in her mouth to biopsy this area. 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  PET scan results from August 10, 2022 reviewed independently with hypermetabolic left base of tongue mass and minimally metabolic lymph node unclear if this is a metastasis or reactive. Pt  received  7 weeks doses of cisplatin 40 mg/m2 with daily XRT (70 Gy) ending on 11/02/2022    THERAPY DIAG:  Dysphagia, oropharyngeal phase  Tongue cancer (HCC)  Trismus  Rationale for Evaluation and Treatment Rehabilitation  SUBJECTIVE: "I ate a pop tart, I had to break it off but tried bigger pieces"  Pt accompanied by: self  PAIN:  Are you having pain? No  PATIENT GOALS: to be able to eat/open her jaw   OBJECTIVE:   TODAY'S TREATMENT:  Skilled treatment session focused on pt's trismus and resulting oral phase dysphagia. SLP facilitated session by providing the following interventions:   SLP facilitated session  by having pt place heat to left jaw for 10 minutes. Pt able to place index figure between front teeth.   SLP provided instruction in lateral jaw movements. Pt able to replicate with Mod I following instructions. Pt with good improved lateralization.   Minimal A faded to Mod I to use over-articulation when reading phrases of progressing lengths  Targeted functional biting with peanut butter cracker - with maximal verbal prompts pt able to increase initial bit size   PATIENT EDUCATION: Education details: see above Person educated: Patient Education method: Programmer, multimedia, Demonstration, Verbal cues, and Handouts Education comprehension: verbalized understanding and needs further education  HOME EXERCISE PROGRAM:  Complete pharyngeal and jaw strengthening exercises  GOALS: Goals reviewed with patient? Yes   SHORT TERM GOALS: Target date: 10 sessions   Pt will complete HEP with rare min A Baseline: Goal status: INITIAL; re-established 2. The patient will increase jaw ROM by 10 mm through daily passive and active jaw stretching exercises in order to safely return to PO diet.    Baseline: profound  Goal status: INITIAL 3. The patient will advance from performing active jaw stretching exercises using a guaze pad to using a chewy tube in order to achieve improved masticatory skills for diet upgrade candidacy.    Baseline: unable  Goal Status: INITIAL       LONG TERM GOALS: Target date: 06/01/2023   Pt will describe how to modify HEP over time, and the timeline associated with reduction in HEP frequency with modified independence over two sessions Baseline:  Goal status: INITIAL; re-established       2. Pt will be able to verbalize understanding of a home exercise program for pharyngeal, trismus and cervical range of motion.     Baseline:    Goal status: INITIAL  3. Patient will consume recommended diet using strategies and compensations without overt s/sx aspiration >95% of the  time.     Baseline:   Goal status: INITIAL        ASSESSMENT:  CLINICAL IMPRESSION: Pt is a 47 year old female who was seen today for re-evaluation of trismus, dysphagia and dysarthria.She reported increased consumption of pop tart, decreased pain around tooth site and decreased ear pain with jaw movements. See the above treatment note for details.   OBJECTIVE IMPAIRMENTS include dysphagia. These impairments are limiting patient from effectively communicating at home and in community and safety when swallowing. Factors affecting potential to achieve goals and functional outcome are medical prognosis. Patient will benefit from skilled SLP services to address above impairments and improve overall function.  REHAB POTENTIAL: Good  PLAN: SLP FREQUENCY: 1-2x/week  SLP DURATION: 10 weeks  PLANNED INTERVENTIONS: Aspiration precaution training, Pharyngeal strengthening exercises, SLP instruction and feedback, Compensatory strategies, and Patient/family education   Jill Ruppe B. Dreama Saa, M.S., CCC-SLP, CBIS Speech-Language Pathologist Certified Brain Injury Specialist [facility]  [facility] Rehabilitation Services Office [phone] Ascom [phone] Fax [phone]

## 2023-04-28 ENCOUNTER — Other Ambulatory Visit: Payer: BC Managed Care – PPO

## 2023-05-02 ENCOUNTER — Ambulatory Visit: Payer: BC Managed Care – PPO | Admitting: Oncology

## 2023-05-02 ENCOUNTER — Ambulatory Visit: Payer: BC Managed Care – PPO | Admitting: Speech Pathology

## 2023-05-04 ENCOUNTER — Ambulatory Visit: Payer: BC Managed Care – PPO | Admitting: Speech Pathology

## 2023-05-08 ENCOUNTER — Ambulatory Visit: Payer: BC Managed Care – PPO | Admitting: Oncology

## 2023-05-09 ENCOUNTER — Ambulatory Visit: Payer: BC Managed Care – PPO | Attending: Oncology | Admitting: Speech Pathology

## 2023-05-09 ENCOUNTER — Inpatient Hospital Stay: Payer: BC Managed Care – PPO | Attending: Oncology

## 2023-05-09 DIAGNOSIS — Z87891 Personal history of nicotine dependence: Secondary | ICD-10-CM | POA: Insufficient documentation

## 2023-05-09 DIAGNOSIS — R1312 Dysphagia, oropharyngeal phase: Secondary | ICD-10-CM | POA: Diagnosis present

## 2023-05-09 DIAGNOSIS — R1313 Dysphagia, pharyngeal phase: Secondary | ICD-10-CM | POA: Diagnosis present

## 2023-05-09 DIAGNOSIS — C01 Malignant neoplasm of base of tongue: Secondary | ICD-10-CM | POA: Insufficient documentation

## 2023-05-09 DIAGNOSIS — R1310 Dysphagia, unspecified: Secondary | ICD-10-CM | POA: Insufficient documentation

## 2023-05-09 DIAGNOSIS — Z923 Personal history of irradiation: Secondary | ICD-10-CM | POA: Insufficient documentation

## 2023-05-09 DIAGNOSIS — R252 Cramp and spasm: Secondary | ICD-10-CM | POA: Insufficient documentation

## 2023-05-09 DIAGNOSIS — C029 Malignant neoplasm of tongue, unspecified: Secondary | ICD-10-CM | POA: Insufficient documentation

## 2023-05-09 DIAGNOSIS — Z9221 Personal history of antineoplastic chemotherapy: Secondary | ICD-10-CM | POA: Insufficient documentation

## 2023-05-09 DIAGNOSIS — R6884 Jaw pain: Secondary | ICD-10-CM | POA: Insufficient documentation

## 2023-05-09 NOTE — Progress Notes (Signed)
Nutrition Follow-up:  Patient has completed treatment of stage III left base of tongue cancer, p 16+.  Completed chemotherapy and radiation in Jan 2024.    Spoke with patient via phone for follow-up.  Patient has been seeing SLP for therapy of trismus.  Ability to open mouth has improved and she is eating more.  Reports that she was able to eat a pork chop for dinner a few nights ago.  Has been eating breakfast (slice of quiche).  Dinner last night was beef stew and 2 corn muffins.  Snacking on pudding, cottage cheese, yogurt.  Has been able to eat grilled ham and cheese sandwich.  Drinking chocolate milk with meals.  Drinking premier protein shake at times but not daily.      Medications: reviewed  Labs: no new  Anthropometrics:   Weight 118 lb today at SLP 108 lb 2 oz on 7/9 134 lb on 10/21/22   NUTRITION DIAGNOSIS: Inadequate oral intake improved  Severe malnutrition has improved with weight gain.   INTERVENTION:  Congratulated patient on weight gain! Encouraged patient to continue working with SLP Continue high calorie, high protein diet to promote continued weight gain Patient will call RD if needed in the future    MONITORING, EVALUATION, GOAL: weight trends, intake   NEXT VISIT: no follow-up RD available if needed   B. Freida Busman, RD, LDN Registered Dietitian [phone]

## 2023-05-09 NOTE — Therapy (Addendum)
OUTPATIENT SPEECH LANGUAGE PATHOLOGY TREATMENT NOTE   Patient Name: Holly Vega MRN: [id number] DOB:January 08, 1976, 47 y.o., female Today's Date: 05/09/2023  PCP: none in chart REFERRING PROVIDER: Gerarda Fraction, MD  END OF SESSION:   End of Session - 05/09/23 0943     Visit Number 8    Number of Visits 20    Date for SLP Re-Evaluation 06/01/23    Authorization Type BCBS COMM PPO    Authorization - Visit Number 8    Authorization - Number of Visits 30    Progress Note Due on Visit 10    SLP Start Time 0940    SLP Stop Time  1015    SLP Time Calculation (min) 35 min    Activity Tolerance Patient tolerated treatment well             Past Medical History:  Diagnosis Date   Depression    Difficult intubation    Endometriosis    Tongue cancer (HCC)    Past Surgical History:  Procedure Laterality Date   ABDOMINAL HYSTERECTOMY  11/20/2018   [facility]   APPENDECTOMY  11/20/2018   [facility]   COLON RESECTION     DIRECT LARYNGOSCOPY N/A 07/21/2022   Procedure: DIRECT MICROLARYNGOSCOPY;  Surgeon: Vernie Murders, MD;  Location: [facility];  Service: ENT;  Laterality: N/A;   IR IMAGING GUIDED PORT INSERTION  09/29/2022   LAPAROSCOPY  01/25/2018   Procedure: LAPAROSCOPY DIAGNOSTIC;  Surgeon: Conard Novak, MD;  Location: [facility] ORS;  Service: Gynecology;;   TONGUE BIOPSY N/A 07/21/2022   Procedure: TONGUE BASE BIOPSY;  Surgeon: Vernie Murders, MD;  Location: [facility];  Service: ENT;  Laterality: N/A;   Patient Active Problem List   Diagnosis Date Noted   Tongue cancer (HCC) 08/05/2022   Hormone replacement therapy 12/24/2018   Rectal endometriosis 10/24/2018   Endometriosis determined by laparoscopy 01/25/2018   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  Per ED chart, pt states that she has had "trouble opening her mouth for the past 4 months."  CT neck on 07/04/2022 revealed a 2.8 x 2.2 x 3.1 cm peripherally enhancing mass in the left retromolar trigone area with gas concerning for a primary squamous cell carcinoma with lymph nodes that appear to be concerning for metastatic disease.  ENT biopsied area on 07/21/2022 and per secure chat with ENT he reports "I believe the trismus was because of the cancer causing pain/inflammation in her pterygoids, so she didn't open her mouth much or stretch it, so it just got tighter. We had to put her asleep with muscle relaxants for me to finally stretch the muscles open again so I could get in her mouth to biopsy this area. 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  PET scan results from August 10, 2022 reviewed independently with hypermetabolic left base of tongue mass and minimally metabolic lymph node unclear if this is a metastasis or reactive. Pt  received  7 weeks doses of cisplatin 40 mg/m2 with daily XRT (70 Gy) ending on 11/02/2022    THERAPY DIAG:  Dysphagia, oropharyngeal phase  Tongue cancer (HCC)  Trismus  Rationale for Evaluation and Treatment Rehabilitation  SUBJECTIVE: "I ate a pop tart, I had to break it off but tried bigger pieces"  Pt accompanied by: self  PAIN:  Are you having pain? No  PATIENT GOALS: to be able to eat/open her jaw   OBJECTIVE:   TODAY'S TREATMENT:  Skilled treatment session focused on pt's trismus and resulting oral phase dysphagia. SLP facilitated session by providing the following interventions:   SLP provided pt with  advanced opening ARK-J device. When providing some compression with her fingers, pt able to allow it to open her jaw beyond previous 10 mm to 25mm. Pt with some pain but is able to work past pain with improved use. She also provides that she is eating more food (including a porkchop) and using the scales here in the wellzone, pt weighed 118 lbs. Big improvement.   PATIENT EDUCATION: Education details: see above Person educated: Patient Education method: Solicitor, Verbal cues, and Handouts Education comprehension: verbalized understanding and needs further education  HOME EXERCISE PROGRAM:  Complete pharyngeal and jaw strengthening exercises  GOALS: Goals reviewed with patient? Yes   SHORT TERM GOALS: Target date: 10 sessions   Pt will complete HEP with rare min A Baseline: Goal status: INITIAL; re-established 2. The patient will increase jaw ROM by 10 mm through daily passive and active jaw stretching exercises in order to safely return to PO diet.    Baseline: profound  Goal status: INITIAL 3. The patient will advance from performing active jaw stretching exercises using a guaze pad to using a chewy tube in order to achieve improved masticatory skills for diet upgrade candidacy.    Baseline: unable  Goal Status: INITIAL       LONG TERM GOALS: Target date: 06/01/2023   Pt will describe how to modify HEP over time, and the timeline associated with reduction in HEP frequency with modified independence over two sessions Baseline:  Goal status: INITIAL; re-established       2. Pt will be able to verbalize understanding of a home exercise program for pharyngeal, trismus and cervical range of motion.     Baseline:    Goal status: INITIAL  3. Patient will consume recommended diet using strategies and compensations without overt s/sx aspiration >95% of the time.     Baseline:   Goal status: INITIAL        ASSESSMENT:  CLINICAL IMPRESSION: Pt is a 47 year old  female who was seen today for re-evaluation of trismus, dysphagia and dysarthria.She reported increased consumption of pop tart, decreased pain around tooth site and decreased ear pain with jaw movements. Pt continues to be eager to participate and continues to do HEP at home. As a result, her trismus is improving. See the above treatment note for details.   OBJECTIVE IMPAIRMENTS include dysphagia. These impairments are limiting patient from effectively communicating at home and in community and safety when swallowing. Factors affecting potential to achieve goals and functional outcome are medical prognosis. Patient will benefit from skilled SLP services to address above impairments and improve overall function.  REHAB POTENTIAL: Good  PLAN: SLP FREQUENCY: 1-2x/week  SLP DURATION: 10 weeks  PLANNED INTERVENTIONS: Aspiration precaution training, Pharyngeal strengthening exercises, SLP instruction and feedback, Compensatory strategies, and Patient/family education   Takahiro Godinho B. Dreama Saa, M.S., CCC-SLP,  Warehouse manager Injury Specialist [facility]  [facility] Rehabilitation Services Office [phone] Ascom [phone] Fax [phone]

## 2023-05-10 ENCOUNTER — Inpatient Hospital Stay: Payer: BC Managed Care – PPO

## 2023-05-10 ENCOUNTER — Inpatient Hospital Stay (HOSPITAL_BASED_OUTPATIENT_CLINIC_OR_DEPARTMENT_OTHER): Payer: BC Managed Care – PPO | Admitting: Oncology

## 2023-05-10 ENCOUNTER — Encounter: Payer: Self-pay | Admitting: Oncology

## 2023-05-10 ENCOUNTER — Telehealth: Payer: Self-pay

## 2023-05-10 VITALS — BP 105/74 | HR 72 | Temp 97.4°F | Resp 16 | Ht 67.0 in | Wt 119.0 lb

## 2023-05-10 DIAGNOSIS — Z87891 Personal history of nicotine dependence: Secondary | ICD-10-CM | POA: Diagnosis not present

## 2023-05-10 DIAGNOSIS — C029 Malignant neoplasm of tongue, unspecified: Secondary | ICD-10-CM | POA: Diagnosis not present

## 2023-05-10 DIAGNOSIS — R6884 Jaw pain: Secondary | ICD-10-CM | POA: Diagnosis not present

## 2023-05-10 DIAGNOSIS — Z95828 Presence of other vascular implants and grafts: Secondary | ICD-10-CM

## 2023-05-10 DIAGNOSIS — Z9221 Personal history of antineoplastic chemotherapy: Secondary | ICD-10-CM | POA: Diagnosis not present

## 2023-05-10 DIAGNOSIS — R1310 Dysphagia, unspecified: Secondary | ICD-10-CM | POA: Diagnosis not present

## 2023-05-10 DIAGNOSIS — C01 Malignant neoplasm of base of tongue: Secondary | ICD-10-CM | POA: Diagnosis present

## 2023-05-10 DIAGNOSIS — Z923 Personal history of irradiation: Secondary | ICD-10-CM | POA: Diagnosis not present

## 2023-05-10 LAB — CBC WITH DIFFERENTIAL/PLATELET
Abs Immature Granulocytes: 0.01 10*3/uL (ref 0.00–0.07)
Basophils Absolute: 0 10*3/uL (ref 0.0–0.1)
Basophils Relative: 1 %
Eosinophils Absolute: 0.2 10*3/uL (ref 0.0–0.5)
Eosinophils Relative: 4 %
HCT: 36.7 % (ref 36.0–46.0)
Hemoglobin: 12.1 g/dL (ref 12.0–15.0)
Immature Granulocytes: 0 %
Lymphocytes Relative: 14 %
Lymphs Abs: 0.7 10*3/uL (ref 0.7–4.0)
MCH: 31.3 pg (ref 26.0–34.0)
MCHC: 33 g/dL (ref 30.0–36.0)
MCV: 95.1 fL (ref 80.0–100.0)
Monocytes Absolute: 0.4 10*3/uL (ref 0.1–1.0)
Monocytes Relative: 8 %
Neutro Abs: 3.4 10*3/uL (ref 1.7–7.7)
Neutrophils Relative %: 73 %
Platelets: 217 10*3/uL (ref 150–400)
RBC: 3.86 MIL/uL — ABNORMAL LOW (ref 3.87–5.11)
RDW: 14.3 % (ref 11.5–15.5)
WBC: 4.6 10*3/uL (ref 4.0–10.5)
nRBC: 0 % (ref 0.0–0.2)

## 2023-05-10 LAB — CMP (CANCER CENTER ONLY)
ALT: 17 U/L (ref 0–44)
AST: 18 U/L (ref 15–41)
Albumin: 4.3 g/dL (ref 3.5–5.0)
Alkaline Phosphatase: 84 U/L (ref 38–126)
Anion gap: 9 (ref 5–15)
BUN: 9 mg/dL (ref 6–20)
CO2: 25 mmol/L (ref 22–32)
Calcium: 9.4 mg/dL (ref 8.9–10.3)
Chloride: 103 mmol/L (ref 98–111)
Creatinine: 0.73 mg/dL (ref 0.44–1.00)
GFR, Estimated: >60 mL/min (ref >60)
Glucose, Bld: 101 mg/dL — ABNORMAL HIGH (ref 70–99)
Potassium: 3.8 mmol/L (ref 3.5–5.1)
Sodium: 137 mmol/L (ref 135–145)
Total Bilirubin: 0.3 mg/dL (ref 0.3–1.2)
Total Protein: 7.2 g/dL (ref 6.5–8.1)

## 2023-05-10 MED ORDER — SODIUM CHLORIDE 0.9% FLUSH
10.0000 mL | Freq: Once | INTRAVENOUS | Status: AC
  Administered 2023-05-10: 10 mL via INTRAVENOUS
  Filled 2023-05-10: qty 10

## 2023-05-10 MED ORDER — HEPARIN SOD (PORK) LOCK FLUSH 100 UNIT/ML IV SOLN
500.0000 [IU] | Freq: Once | INTRAVENOUS | Status: AC
  Administered 2023-05-10: 500 [IU] via INTRAVENOUS
  Filled 2023-05-10: qty 5

## 2023-05-10 NOTE — Telephone Encounter (Signed)
Orders for port removal faxed over to IR.

## 2023-05-10 NOTE — Progress Notes (Signed)
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  INTERVAL HISTORY: Patient returns to clinic today for repeat laboratory work and further evaluation.  Her jaw pain and trismus has significantly improved and she continues to follow-up with speech pathology.  She continues to have mild dysphagia.  She has no neurologic complaints.  She denies any fevers.  She has no chest pain, shortness of breath, cough, or hemoptysis.  She denies any nausea, vomiting, constipation, or diarrhea.  She has no urinary complaints.  Patient offers no further specific complaints today.  REVIEW OF SYSTEMS:   Review of Systems  Constitutional: Negative.  Negative for fever, malaise/fatigue and weight loss.  HENT:  Negative for sore throat.        Jaw pain  Respiratory: Negative.  Negative for cough, hemoptysis and shortness of breath.   Cardiovascular: Negative.  Negative for chest pain and leg swelling.  Gastrointestinal: Negative.  Negative for abdominal pain.  Genitourinary: Negative.  Negative for dysuria.  Musculoskeletal:  Negative for neck pain.  Skin: Negative.  Negative for rash.  Neurological: Negative.  Negative for dizziness, focal weakness, weakness and headaches.  Psychiatric/Behavioral: Negative.  The patient is not nervous/anxious.     As per HPI. Otherwise, a complete review of systems is negative.  PAST MEDICAL HISTORY: Past Medical History:  Diagnosis Date   Depression    Difficult intubation    Endometriosis    Tongue cancer [facility])     PAST SURGICAL HISTORY: Past Surgical History:  Procedure Laterality Date   ABDOMINAL HYSTERECTOMY  11/20/2018   [facility]   APPENDECTOMY   11/20/2018   [facility]   COLON RESECTION     DIRECT LARYNGOSCOPY N/A 07/21/2022   Procedure: DIRECT MICROLARYNGOSCOPY;  Surgeon: Vernie Murders, MD;  Location: [facility];  Service: ENT;  Laterality: N/A;   IR IMAGING GUIDED PORT INSERTION  09/29/2022   LAPAROSCOPY  01/25/2018   Procedure: LAPAROSCOPY DIAGNOSTIC;  Surgeon: Conard Novak, MD;  Location: [facility] ORS;  Service: Gynecology;;   TONGUE BIOPSY N/A 07/21/2022   Procedure: TONGUE BASE BIOPSY;  Surgeon: Vernie Murders, MD;  Location: [facility];  Service: ENT;  Laterality: N/A;    FAMILY HISTORY: Family History  Problem Relation Age of Onset   Cancer Maternal Uncle     ADVANCED DIRECTIVES (Y/N):  N  HEALTH MAINTENANCE: Social History   Tobacco Use   Smoking status: Former    Current packs/day: 0.00    Average packs/day: 0.8 packs/day for 30.0 years (22.5 ttl pk-yrs)    Types: Cigarettes    Start date: 07/05/1992    Quit date: 07/05/2022    Years since quitting: 0.8   Smokeless tobacco: Never   Tobacco comments:       Vaping Use   Vaping status: Never Used  Substance Use Topics   Alcohol use: No   Drug use: No     Colonoscopy:  PAP:  Bone density:  Lipid panel:  Allergies  Allergen Reactions   Chlorhexidine Gluconate Rash    Current Outpatient Medications  Medication Sig Dispense Refill   cyclobenzaprine (FLEXERIL) 10 MG tablet Take 10 mg by mouth at bedtime.     HYDROcodone-acetaminophen (NORCO/VICODIN) 5-325 MG tablet Take  1 tablet by mouth every 4 (four) hours as needed. (Patient not taking: Reported on 03/17/2023)     lidocaine (XYLOCAINE) 2 % solution Use as directed 15 mLs in the mouth or throat every 6 (six) hours as needed for mouth pain. (Patient not taking: Reported on 05/10/2023) 250 mL 0   lidocaine-prilocaine (EMLA) cream Apply 1 Application topically as needed. (Patient not taking: Reported on 03/08/2023) 30 g 0   traMADol (ULTRAM) 50 MG tablet Take 1 tablet (50 mg total) by mouth  every 12 (twelve) hours as needed. (Patient not taking: Reported on 05/10/2023) 30 tablet 0   No current facility-administered medications for this visit.    OBJECTIVE: Vitals:   05/10/23 1035  BP: 105/74  Pulse: 72  Resp: 16  Temp: (!) 97.4 F (36.3 C)  SpO2: 100%     Body mass index is 18.64 kg/m.    ECOG FS:0 - Asymptomatic  General: Thin, no acute distress. Eyes: Pink conjunctiva, anicteric sclera. HEENT: Normocephalic, moist mucous membranes.  No palpable lymphadenopathy. Lungs: No audible wheezing or coughing. Heart: Regular rate and rhythm. Abdomen: Soft, nontender, no obvious distention. Musculoskeletal: No edema, cyanosis, or clubbing. Neuro: Alert, answering all questions appropriately. Cranial nerves grossly intact. Skin: No rashes or petechiae noted. Psych: Normal affect.   LAB RESULTS:  Lab Results  Component Value Date   NA 137 05/10/2023   K 3.8 05/10/2023   CL 103 05/10/2023   CO2 25 05/10/2023   GLUCOSE 101 (H) 05/10/2023   BUN 9 05/10/2023   CREATININE 0.73 05/10/2023   CALCIUM 9.4 05/10/2023   PROT 7.2 05/10/2023   ALBUMIN 4.3 05/10/2023   AST 18 05/10/2023   ALT 17 05/10/2023   ALKPHOS 84 05/10/2023   BILITOT 0.3 05/10/2023   GFRNONAA >60 05/10/2023   GFRAA >60 01/15/2018    Lab Results  Component Value Date   WBC 4.6 05/10/2023   NEUTROABS 3.4 05/10/2023   HGB 12.1 05/10/2023   HCT 36.7 05/10/2023   MCV 95.1 05/10/2023   PLT 217 05/10/2023     STUDIES: No results found.  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  Patient completed weekly cisplatin along with daily XRT on March 22, 2023.  CT scans from March 10, 2023 reviewed independently with no obvious evidence of recurrence.  Patient also had a recent ENT exam  that was reported as normal.  No intervention is needed at this time.  No further imaging is necessary unless there is suspicion of recurrence.  Patient instructed to continue her close follow-up with ENT.  Return to clinic in 6 months for further evaluation.  Jaw pain/trismus: Continue tramadol as prescribed.  Continue follow-up with speech pathology and ENT. Port: Patient was given a referral to have port removal Poor appetite/weight loss: Improved.   Patient expressed understanding and was in agreement with this plan. She also understands that She can call clinic at any time with any questions, concerns, or complaints.    Cancer Staging  Tongue cancer [facility]) Staging form: Oral Cavity, AJCC 8th Edition - Clinical stage from 08/05/2022: Stage III (cT3, cN1, cM0) - Signed by Jeralyn Ruths, MD on 08/05/2022 Stage prefix: Initial diagnosis   Jeralyn Ruths, MD   05/10/2023 3:24 PM

## 2023-05-10 NOTE — Progress Notes (Signed)
Questions about how long to keep the port.

## 2023-05-11 ENCOUNTER — Ambulatory Visit: Payer: BC Managed Care – PPO | Admitting: Speech Pathology

## 2023-05-11 ENCOUNTER — Other Ambulatory Visit: Payer: Self-pay

## 2023-05-11 DIAGNOSIS — R1312 Dysphagia, oropharyngeal phase: Secondary | ICD-10-CM | POA: Diagnosis not present

## 2023-05-11 DIAGNOSIS — R1313 Dysphagia, pharyngeal phase: Secondary | ICD-10-CM

## 2023-05-11 DIAGNOSIS — R252 Cramp and spasm: Secondary | ICD-10-CM

## 2023-05-11 DIAGNOSIS — C029 Malignant neoplasm of tongue, unspecified: Secondary | ICD-10-CM

## 2023-05-11 NOTE — Therapy (Addendum)
OUTPATIENT SPEECH LANGUAGE PATHOLOGY TREATMENT NOTE   Patient Name: Holly Vega MRN: [id number] DOB:04/14/1976, 47 y.o., female Today's Date: 05/11/2023  PCP: none in chart REFERRING PROVIDER: Gerarda Fraction, MD  END OF SESSION:   End of Session - 05/11/23 1159     Visit Number 9   Number of Visits 20    Date for SLP Re-Evaluation 06/01/23    Authorization Type BCBS COMM PPO    Authorization - Visit Number 9    Authorization - Number of Visits 30    Progress Note Due on Visit 10    SLP Start Time 1145    SLP Stop Time  1230    SLP Time Calculation (min) 45 min    Activity Tolerance Patient tolerated treatment well             Past Medical History:  Diagnosis Date   Depression    Difficult intubation    Endometriosis    Tongue cancer (HCC)    Past Surgical History:  Procedure Laterality Date   ABDOMINAL HYSTERECTOMY  11/20/2018   [facility]   APPENDECTOMY  11/20/2018   [facility]   COLON RESECTION     DIRECT LARYNGOSCOPY N/A 07/21/2022   Procedure: DIRECT MICROLARYNGOSCOPY;  Surgeon: Vernie Murders, MD;  Location: [facility];  Service: ENT;  Laterality: N/A;   IR IMAGING GUIDED PORT INSERTION  09/29/2022   LAPAROSCOPY  01/25/2018   Procedure: LAPAROSCOPY DIAGNOSTIC;  Surgeon: Conard Novak, MD;  Location: [facility] ORS;  Service: Gynecology;;   TONGUE BIOPSY N/A 07/21/2022   Procedure: TONGUE BASE BIOPSY;  Surgeon: Vernie Murders, MD;  Location: [facility];  Service: ENT;  Laterality: N/A;   Patient Active Problem List   Diagnosis Date Noted   Tongue cancer (HCC) 08/05/2022   Hormone replacement therapy 12/24/2018   Rectal endometriosis 10/24/2018   Endometriosis determined by laparoscopy 01/25/2018   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  Per ED chart, pt states that she has had "trouble opening her mouth for the past 4 months."  CT neck on 07/04/2022 revealed a 2.8 x 2.2 x 3.1 cm peripherally enhancing mass in the left retromolar trigone area with gas concerning for a primary squamous cell carcinoma with lymph nodes that appear to be concerning for metastatic disease.  ENT biopsied area on 07/21/2022 and per secure chat with ENT he reports "I believe the trismus was because of the cancer causing pain/inflammation in her pterygoids, so she didn't open her mouth much or stretch it, so it just got tighter. We had to put her asleep with muscle relaxants for me to finally stretch the muscles open again so I could get in her mouth to biopsy this area. 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  PET scan results from August 10, 2022 reviewed independently with hypermetabolic left base of tongue mass and minimally metabolic lymph node unclear if this is a metastasis or reactive. Pt received  7 weeks doses of cisplatin 40 mg/m2 with daily XRT (70 Gy) ending on 11/02/2022    THERAPY DIAG:  Dysphagia, oropharyngeal phase  Tongue cancer (HCC)  Trismus  Dysphagia, pharyngeal phase  Rationale for Evaluation and Treatment Rehabilitation  SUBJECTIVE: "I feel like I am making great progress this week"  Pt accompanied by: self  PAIN:  Are you having pain? No  PATIENT GOALS: to be able to eat/open her jaw   OBJECTIVE:   TODAY'S TREATMENT:  Skilled treatment session focused on pt's trismus and resulting oral phase dysphagia. SLP facilitated session by providing the following interventions:   SLP  provided heat for left jaw area for 10 minutes. Pt's functional oral abilities assessed with pastry crisp. Pt able to place medium size bite in her mouth d/t improved MIO. Pt with activate lateralization and mastication patterns observed. This was further evidenced by timely consumption of item vs prolonged consumption of half of item during previous sessions. Also after last session, pt weighed with improving weight gain.   PATIENT EDUCATION: Education details: see above Person educated: Patient Education method: Solicitor, Verbal cues, and Handouts Education comprehension: verbalized understanding and needs further education  HOME EXERCISE PROGRAM:  Complete pharyngeal and jaw strengthening exercises  GOALS: Goals reviewed with patient? Yes   SHORT TERM GOALS: Target date: 10 sessions   Pt will complete HEP with rare min A Baseline: Goal status: INITIAL; re-established 2. The patient will increase jaw ROM by 10 mm through daily passive and active jaw stretching exercises in order to safely return to PO diet.    Baseline: profound  Goal status: INITIAL 3. The patient will advance from performing active jaw stretching exercises using a guaze pad to using a chewy tube in order to achieve improved masticatory skills for diet upgrade candidacy.    Baseline: unable  Goal Status: INITIAL       LONG TERM GOALS: Target date: 06/01/2023   Pt will describe how to modify HEP over time, and the timeline associated with reduction in HEP frequency with modified independence over two sessions Baseline:  Goal status: INITIAL; re-established       2. Pt will be able to verbalize understanding of a home exercise program for pharyngeal, trismus and cervical range of motion.     Baseline:    Goal status: INITIAL  3. Patient will consume recommended diet using strategies and compensations without overt s/sx aspiration >95% of the time.     Baseline:   Goal status: INITIAL         ASSESSMENT:  CLINICAL IMPRESSION: Pt is a 47 year old female who was seen today for re-evaluation of trismus, dysphagia and dysarthria.She reported increased consumption of pop tart, decreased pain around tooth site and decreased ear pain with jaw movements. Pt continues to be eager to participate and continues to do HEP at home. As a result, her trismus is improving. See the above treatment note for details.   OBJECTIVE IMPAIRMENTS include dysphagia. These impairments are limiting patient from effectively communicating at home and in community and safety when swallowing. Factors affecting potential to achieve goals and functional outcome are medical prognosis. Patient will benefit from skilled SLP services to address above impairments and improve overall function.  REHAB POTENTIAL: Good  PLAN: SLP FREQUENCY: 1-2x/week  SLP DURATION: 10 weeks  PLANNED INTERVENTIONS: Aspiration precaution training, Pharyngeal strengthening exercises, SLP instruction and feedback, Compensatory strategies, and Patient/family education   Cloteal Isaacson B. Dreama Saa, M.S., CCC-SLP, CBIS Speech-Language Pathologist Certified Brain  Injury Specialist [facility]  [facility] Rehabilitation Services Office [phone] Ascom [phone] Fax [phone]

## 2023-05-12 ENCOUNTER — Encounter: Payer: Self-pay | Admitting: *Deleted

## 2023-05-15 NOTE — Addendum Note (Signed)
Encounter addended by: Lynda Rainwater, RT on: 05/15/2023 9:25 AM  Actions taken: Imaging Exam ended

## 2023-05-15 NOTE — Progress Notes (Signed)
Patient for IR Port Removal on Tuesday 05/16/23, I called and spoke with the patient on the phone and gave pre-procedure instructions. Pt was made aware to be here at 10:30a. Pt stated understanding.  Called 05/15/2023

## 2023-05-15 NOTE — Addendum Note (Signed)
Encounter addended by: Meta Hatchet, RT on: 05/15/2023 11:16 AM  Actions taken: Imaging Exam ended

## 2023-05-16 ENCOUNTER — Ambulatory Visit: Payer: BC Managed Care – PPO | Admitting: Speech Pathology

## 2023-05-16 ENCOUNTER — Ambulatory Visit
Admission: RE | Admit: 2023-05-16 | Discharge: 2023-05-16 | Disposition: A | Discharge status: Home / Self Care | Payer: BC Managed Care – PPO | Source: Ambulatory Visit | Attending: Oncology | Admitting: Oncology

## 2023-05-16 DIAGNOSIS — C029 Malignant neoplasm of tongue, unspecified: Secondary | ICD-10-CM | POA: Diagnosis not present

## 2023-05-16 DIAGNOSIS — Z9221 Personal history of antineoplastic chemotherapy: Secondary | ICD-10-CM | POA: Insufficient documentation

## 2023-05-16 DIAGNOSIS — Z452 Encounter for adjustment and management of vascular access device: Secondary | ICD-10-CM | POA: Diagnosis present

## 2023-05-16 HISTORY — PX: IR REMOVAL TUN ACCESS W/ PORT W/O FL MOD SED: IMG2290

## 2023-05-16 MED ORDER — LIDOCAINE-EPINEPHRINE 1 %-1:100000 IJ SOLN
INTRAMUSCULAR | Status: AC
  Filled 2023-05-16: qty 1

## 2023-05-16 MED ORDER — LIDOCAINE-EPINEPHRINE 1 %-1:100000 IJ SOLN
10.0000 mL | Freq: Once | INTRAMUSCULAR | Status: AC
  Administered 2023-05-16: 10 mL via INTRADERMAL

## 2023-05-16 NOTE — Procedures (Signed)
Vascular and Interventional Radiology Procedure Note  Patient: Holly Vega DOB: [birth year] Medical Record Number: [id number] Note Date/Time: 05/16/23 12:27 PM   Performing Physician: Roanna Banning, MD Assistant(s): None  Diagnosis: Head and Neck cancer. RX completion.  Procedure: PORT REMOVAL  Anesthesia: Local Anesthetic Complications: None Estimated Blood Loss: Minimal Specimens:  None  Findings:  Successful removal of a right-sided venous port. Primary incision closure. Dermabond at skin.  See detailed procedure note with images in PACS. The patient tolerated the procedure well without incident or complication and was returned to Recovery in stable condition.    Roanna Banning, MD Vascular and Interventional Radiology Specialists [facility]   Pager. [phone] Clinic. [phone]

## 2023-05-18 ENCOUNTER — Ambulatory Visit: Payer: BC Managed Care – PPO | Admitting: Speech Pathology

## 2023-05-18 DIAGNOSIS — R252 Cramp and spasm: Secondary | ICD-10-CM

## 2023-05-18 DIAGNOSIS — C029 Malignant neoplasm of tongue, unspecified: Secondary | ICD-10-CM

## 2023-05-18 DIAGNOSIS — R1313 Dysphagia, pharyngeal phase: Secondary | ICD-10-CM

## 2023-05-18 DIAGNOSIS — R1312 Dysphagia, oropharyngeal phase: Secondary | ICD-10-CM

## 2023-05-18 NOTE — Therapy (Addendum)
OUTPATIENT SPEECH LANGUAGE PATHOLOGY TREATMENT NOTE 10th Visit progress note   Patient Name: Holly Vega MRN: [id number] DOB:[birth year], 47 y.o., female Today's Date: 05/18/2023  PCP: none in chart REFERRING PROVIDER: Gerarda Fraction, MD  Speech Therapy Progress Note  Dates of Reporting Period: 11/08/2022 to 05/18/2023  Objective: Patient has been seen for 10 speech therapy sessions this reporting period targeting severe trismus. Pt initially made great progress towards goals (12/01/2022) but suffered a set-back with increased trismus d/t dental pain, dental surgery and increased trismus. Since resuming services on 04/06/2023 pt has made steady progress with improved trismus. See below for progress towards goals.    END OF SESSION:   End of Session - 05/18/23 1156     Visit Number 10    Number of Visits 20    Date for SLP Re-Evaluation 06/01/23    Authorization Type BCBS COMM PPO    Authorization - Visit Number 10    Authorization - Number of Visits 30    Progress Note Due on Visit 10    SLP Start Time 1150    SLP Stop Time  1230    SLP Time Calculation (min) 40 min    Activity Tolerance Patient tolerated treatment well             Past Medical History:  Diagnosis Date   Depression    Difficult intubation    Endometriosis    Tongue cancer (HCC)    Past Surgical History:  Procedure Laterality Date   ABDOMINAL HYSTERECTOMY  11/20/2018   [facility]   APPENDECTOMY  11/20/2018   [facility]   COLON RESECTION     DIRECT LARYNGOSCOPY N/A 07/21/2022   Procedure: DIRECT MICROLARYNGOSCOPY;  Surgeon: Vernie Murders, MD;  Location: [facility];  Service: ENT;  Laterality: N/A;   IR IMAGING GUIDED PORT INSERTION  09/29/2022   IR REMOVAL TUN ACCESS W/ PORT W/O FL MOD SED  05/16/2023   LAPAROSCOPY  01/25/2018   Procedure: LAPAROSCOPY DIAGNOSTIC;  Surgeon: Conard Novak, MD;  Location: [facility] ORS;  Service: Gynecology;;   TONGUE BIOPSY N/A 07/21/2022   Procedure: TONGUE BASE  BIOPSY;  Surgeon: Vernie Murders, MD;  Location: [facility];  Service: ENT;  Laterality: N/A;   Patient Active Problem List   Diagnosis Date Noted   Tongue cancer (HCC) 08/05/2022   Hormone replacement therapy 12/24/2018   Rectal endometriosis 10/24/2018   Endometriosis determined by laparoscopy 01/25/2018   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  Per ED chart, pt states that she has had "trouble opening her mouth for the past 4 months."  CT neck on 07/04/2022 revealed a 2.8 x 2.2 x 3.1 cm peripherally enhancing mass in the left retromolar trigone area with gas concerning for a primary squamous cell carcinoma with lymph nodes that appear to be concerning for metastatic disease.  ENT biopsied area on 07/21/2022 and per secure chat with ENT he reports "I believe the trismus was because of the cancer causing pain/inflammation in her pterygoids, so she didn't open her mouth much or stretch it, so it just got tighter. We had to put her asleep with muscle relaxants for me to finally stretch  the muscles open again so I could get in her mouth to biopsy this area. 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  PET scan results from August 10, 2022 reviewed independently with hypermetabolic left base of tongue mass and minimally metabolic lymph node unclear if this is a metastasis or reactive. Pt received  7 weeks doses of cisplatin 40 mg/m2 with daily XRT  (70 Gy) ending on 11/02/2022    THERAPY DIAG:  Dysphagia, oropharyngeal phase  Tongue cancer (HCC)  Trismus  Dysphagia, pharyngeal phase  Rationale for Evaluation and Treatment Rehabilitation  SUBJECTIVE: "I can chew now"  Pt accompanied by: self  PAIN:  Are you having pain? No  PATIENT GOALS: to be able to eat/open her jaw   OBJECTIVE:   TODAY'S TREATMENT:  Skilled treatment session focused on pt's trismus and resulting oral phase dysphagia. SLP facilitated session by providing the following interventions:   Pt reports that she continues to increase consumption of food including more advanced solids (tomatoes, bread and ice cream cones). During today's session, she is able to demonstrate a rotary chew pattern, perform active and passive ROM exercises with Mod I, neck stretches and over articulation.   PATIENT EDUCATION: Education details: see above Person educated: Patient Education method: Solicitor, Verbal cues, and Handouts Education comprehension: verbalized understanding and needs further education  HOME EXERCISE PROGRAM:  Complete pharyngeal and jaw strengthening exercises  GOALS: Goals reviewed with patient? Yes   SHORT TERM GOALS: Target date: 10 sessions  Updated: 05/18/2023 Pt will complete HEP with rare min A Baseline: Goal status: INITIAL; re-established;  2. The patient will increase jaw ROM by 10 mm through daily passive and active jaw stretching exercises in order to safely return to PO diet.    Baseline: profound  Goal status: INITIAL 3. The patient will advance from performing active jaw stretching exercises using a guaze pad to using a chewy tube in order to achieve improved masticatory skills for diet upgrade candidacy.    Baseline: unable  Goal Status: INITIAL: progress made; met for minced items     LONG TERM GOALS: Target date: 06/01/2023   Pt will describe how to modify HEP over time, and the timeline associated with  reduction in HEP frequency with modified independence over two sessions Baseline:  Goal status: INITIAL; re-established: great progress made: currently supervision A        2. Pt will be able to verbalize understanding of a home exercise program for pharyngeal, trismus and cervical range of motion.     Baseline:    Goal status: INITIAL: great progress made; currently rare Min A   3. Patient will consume recommended diet using strategies and compensations without overt s/sx aspiration >95% of the time.     Baseline: Goal status: INITIAL: great progress made, currently consuming dysphagia 2 diet textures        ASSESSMENT:  CLINICAL IMPRESSION: Pt is a 47 year old female who was seen today for treatment of trismus, dysphagia and dysarthria.She continues to complete HEP, demonstrate improved jaw opening and mastication. As such she reports consuming more advanced textures. Pt is independent to MOD I with all exercises and will likely reduce treatment to 1xweek in the coming week. She reports having a follow up ENT appt  next week and will wait for note. See the above treatment note for details of today's session.   OBJECTIVE IMPAIRMENTS include dysphagia. These impairments are limiting patient from effectively communicating at home and in community and safety when swallowing. Factors affecting potential to achieve goals and functional outcome are medical prognosis. Patient will benefit from skilled SLP services to address above impairments and improve overall function.  REHAB POTENTIAL: Good  PLAN: SLP FREQUENCY: 1-2x/week  SLP DURATION: 10 weeks  PLANNED INTERVENTIONS: Aspiration precaution training, Pharyngeal strengthening exercises, SLP instruction and feedback, Compensatory strategies, and Patient/family education   Axel Frisk B. Dreama Saa, M.S., CCC-SLP, Tree surgeon Certified Brain Injury Specialist [facility]  [facility] Rehabilitation  Services Office [phone] Ascom [phone] Fax [phone]

## 2023-05-23 ENCOUNTER — Ambulatory Visit: Payer: BC Managed Care – PPO | Admitting: Speech Pathology

## 2023-05-25 ENCOUNTER — Ambulatory Visit: Payer: BC Managed Care – PPO | Admitting: Speech Pathology

## 2023-05-30 ENCOUNTER — Ambulatory Visit: Payer: BC Managed Care – PPO

## 2023-06-01 ENCOUNTER — Ambulatory Visit: Payer: BC Managed Care – PPO | Admitting: Speech Pathology

## 2023-06-06 ENCOUNTER — Ambulatory Visit: Payer: BC Managed Care – PPO | Attending: Oncology | Admitting: Speech Pathology

## 2023-06-06 DIAGNOSIS — C029 Malignant neoplasm of tongue, unspecified: Secondary | ICD-10-CM | POA: Insufficient documentation

## 2023-06-06 DIAGNOSIS — R1313 Dysphagia, pharyngeal phase: Secondary | ICD-10-CM | POA: Diagnosis present

## 2023-06-06 DIAGNOSIS — R252 Cramp and spasm: Secondary | ICD-10-CM | POA: Diagnosis present

## 2023-06-06 DIAGNOSIS — R1312 Dysphagia, oropharyngeal phase: Secondary | ICD-10-CM | POA: Insufficient documentation

## 2023-06-06 NOTE — Therapy (Signed)
OUTPATIENT SPEECH LANGUAGE PATHOLOGY TREATMENT NOTE Re-certification Request   Patient Name: Holly Vega MRN: [id number] DOB:10/11/1975, 47 y.o., female Today's Date: 06/06/2023  PCP: none in chart REFERRING PROVIDER: Gerarda Fraction, MD    END OF SESSION:   End of Session - 06/06/23 1106     Visit Number 11    Number of Visits 20    Date for SLP Re-Evaluation 07/11/23    Authorization Type [employer] COMM PPO    Authorization - Visit Number 11    Authorization - Number of Visits 30    Progress Note Due on Visit 20    SLP Start Time 1100    SLP Stop Time  1130    SLP Time Calculation (min) 30 min    Activity Tolerance Patient tolerated treatment well               Past Medical History:  Diagnosis Date   Depression    Difficult intubation    Endometriosis    Tongue cancer (HCC)    Past Surgical History:  Procedure Laterality Date   ABDOMINAL HYSTERECTOMY  11/20/2018   [facility]   APPENDECTOMY  11/20/2018   [facility]   COLON RESECTION     DIRECT LARYNGOSCOPY N/A 07/21/2022   Procedure: DIRECT MICROLARYNGOSCOPY;  Surgeon: Vernie Murders, MD;  Location: [facility];  Service: ENT;  Laterality: N/A;   IR IMAGING GUIDED PORT INSERTION  09/29/2022   IR REMOVAL TUN ACCESS W/ PORT W/O FL MOD SED  05/16/2023   LAPAROSCOPY  01/25/2018   Procedure: LAPAROSCOPY DIAGNOSTIC;  Surgeon: Conard Novak, MD;  Location: [facility] OR[facility];  Service: Gynecology;;   TONGUE BIOPSY N/A 07/21/2022   Procedure: TONGUE BASE BIOPSY;  Surgeon: Vernie Murders, MD;  Location: [facility];  Service: ENT;  Laterality: N/A;   Patient Active Problem List   Diagnosis Date Noted   Tongue cancer (HCC) 08/05/2022   Hormone replacement therapy 12/24/2018   Rectal endometriosis 10/24/2018   Endometriosis determined by laparoscopy 01/25/2018   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  Per ED chart, pt states that she has had "trouble opening her mouth for the past 4 months."  CT neck on 07/04/2022 revealed a 2.8 x 2.2 x 3.1 cm peripherally enhancing mass in the left retromolar trigone area with gas concerning for a primary squamous cell carcinoma with lymph nodes that appear to be concerning for metastatic disease.  ENT biopsied area on 07/21/2022 and per secure chat with ENT he reports "I believe the trismus was because of the cancer causing pain/inflammation in her pterygoids, so she didn't open her mouth much or stretch it, so it just got tighter. We had to put her asleep with muscle relaxants for me to finally stretch the muscles open again so I could get in her mouth to biopsy this area. 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  PET scan results from August 10, 2022 reviewed independently with  hypermetabolic left base of tongue mass and minimally metabolic lymph node unclear if this is a metastasis or reactive. Pt received  7 weeks doses of cisplatin 40 mg/m2 with daily XRT (70 Gy) ending on 11/02/2022    THERAPY DIAG:  Dysphagia, oropharyngeal phase  Tongue cancer (HCC)  Trismus  Dysphagia, pharyngeal phase  Rationale for Evaluation and Treatment Rehabilitation  SUBJECTIVE: "I can chew now"  Pt accompanied by: self  PAIN:  Are you having pain? No  PATIENT GOALS: to be able to eat/open her jaw   OBJECTIVE:   TODAY'S TREATMENT:  Skilled treatment session focused on pt's trismus and resulting oral phase dysphagia. SLP  facilitated session by providing the following interventions:   Pt reports that she continues to increase consumption of food including more advanced solids (pork chops) and she reports that read is no longer though she continues to have dry mouth. Pt also weighted during today's session with 4 lb weight gain in last month (122 lbs improved from 118 lbs). Instructed pt to use yellow theraband to actively and passively stretch. Pt with questions regarding placement and demonstrated good understanding of instructions.   PATIENT EDUCATION: Education details: see above Person educated: Patient Education method: Solicitor, Verbal cues, and Handouts Education comprehension: verbalized understanding and needs further education  HOME EXERCISE PROGRAM:  Complete pharyngeal and jaw strengthening exercises  GOALS: Goals reviewed with patient? Yes   SHORT TERM GOALS: Target date: 10 sessions  Updated: 06/06/2023 Pt will complete HEP with rare min A Baseline: Goal status: INITIAL; re-established; goal met; upgraded to Pt will complete HEP independently.  2. The patient will increase jaw ROM by 10 mm through daily passive and active jaw stretching exercises in order to safely return to PO diet.    Baseline: profound  Goal status: INITIAL:  goal met  3. The patient will advance from performing active jaw stretching exercises using a guaze pad to using a chewy tube in order to achieve improved masticatory skills for diet upgrade candidacy.    Baseline: unable Goal Status: INITIAL: progress made; met for minced items: progress made, pt reports consuming equivalent of mechanical soft diet     LONG TERM GOALS: Target date: 07/11/2023  Updated 06/06/2023 Pt will describe how to modify HEP over time, and the timeline associated with reduction in HEP frequency with modified independence over two sessions Baseline:  Goal status: INITIAL; re-established: great progress made: currently  supervision A: goal met upgraded to Pt will describe how to modify HEP over time, and the timeline associated with reduction in HEP frequency independently.     2. Pt will be able to verbalize understanding of a home exercise program for pharyngeal, trismus and cervical range of motion.     Baseline:  Goal status: INITIAL: great progress made; currently rare Min A: progress made to Mod I  3. Patient will consume recommended diet using strategies and compensations without overt s/sx aspiration >95% of the time.     Baseline: Goal status: INITIAL: great progress made, currently consuming dysphagia 3 diet textures        ASSESSMENT:  CLINICAL IMPRESSION: Pt is a 47 year old female who was seen today for treatment of trismus, dysphagia and dysarthria. Pt arrived with great functional improvement as evidenced by improved MIO with ability to place 2 fingers interdentally. Pt also reports increased PO intake and intake of advanced textures. "So I had some concerns before the tooth pain, can your skin grow together or is it  truly a muscle thing - when I open my mouth it almost feels as if the skin is attached on the inside of my mouth I just wanted to be sure that it was muscle that I am working and not fighting against myself - my mouth and throat are still dry but I can eat bread now, I can eat and not even realize that I am taking a bite and chewing elsewhere other than the front of my teeth." Pt will ask for re-certification for 1 x month with next appt scheduled for 07/11/2023.  See the above treatment note for details of today's session.   OBJECTIVE IMPAIRMENTS include dysphagia. These impairments are limiting patient from effectively communicating at home and in community and safety when swallowing. Factors affecting potential to achieve goals and functional outcome are medical prognosis. Patient will benefit from skilled SLP services to address above impairments and improve overall  function.  REHAB POTENTIAL: Good  PLAN: SLP FREQUENCY: 1-2x/week  SLP DURATION: 10 weeks  PLANNED INTERVENTIONS: Aspiration precaution training, Pharyngeal strengthening exercises, SLP instruction and feedback, Compensatory strategies, and Patient/family education   Mahira Petras B. Dreama Saa, M.S., CCC-SLP, Tree surgeon Certified Brain Injury Specialist [facility]  [facility] Rehabilitation Services Office [phone] Ascom [phone] Fax [phone]

## 2023-06-08 ENCOUNTER — Ambulatory Visit: Payer: BC Managed Care – PPO | Admitting: Speech Pathology

## 2023-07-11 ENCOUNTER — Ambulatory Visit: Payer: BC Managed Care – PPO | Attending: Oncology | Admitting: Speech Pathology

## 2023-07-11 ENCOUNTER — Telehealth: Payer: Self-pay | Admitting: Speech Pathology

## 2023-07-11 NOTE — Telephone Encounter (Signed)
Pt was a no call/no show for today's session.   Pt forgot about the appt but reports that she is eating with utensils and isn't "really thinking about it."  She continues to report that she is doing her "exercises."   At this time, no further appts are scheduled.   Pt will reach out if she has a need.   Milly Goggins B. Dreama Saa, M.S., CCC-SLP, Tree surgeon Certified Brain Injury Specialist [facility]  [facility] Rehabilitation Services Office [phone] Ascom [phone] Fax [phone]

## 2023-11-10 ENCOUNTER — Inpatient Hospital Stay: Payer: Self-pay | Admitting: Oncology

## 2023-11-11 ENCOUNTER — Other Ambulatory Visit: Payer: Self-pay

## 2023-11-17 ENCOUNTER — Inpatient Hospital Stay: Payer: Self-pay | Attending: Oncology | Admitting: Oncology

## 2023-11-17 ENCOUNTER — Encounter: Payer: Self-pay | Admitting: Oncology

## 2023-11-17 VITALS — BP 123/81 | HR 69 | Temp 96.4°F | Resp 16 | Ht 67.0 in | Wt 129.8 lb

## 2023-11-17 DIAGNOSIS — Z87891 Personal history of nicotine dependence: Secondary | ICD-10-CM | POA: Insufficient documentation

## 2023-11-17 DIAGNOSIS — C01 Malignant neoplasm of base of tongue: Secondary | ICD-10-CM | POA: Insufficient documentation

## 2023-11-17 DIAGNOSIS — C029 Malignant neoplasm of tongue, unspecified: Secondary | ICD-10-CM

## 2023-11-17 DIAGNOSIS — Z809 Family history of malignant neoplasm, unspecified: Secondary | ICD-10-CM | POA: Insufficient documentation

## 2023-11-17 NOTE — Progress Notes (Signed)
 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  INTERVAL HISTORY: Patient returns to clinic today for routine 67-month evaluation.  Her jaw pain, dental issues, and trismus have essentially resolved.  She currently feels well.  She does not complain of any pain or dysphagia today.  She has a good appetite and is gaining weight appropriately.  She has no neurologic complaints.  She denies any recent fevers or illnesses.  She has no chest pain, shortness of breath, cough, or hemoptysis.  She denies any nausea, vomiting, constipation, or diarrhea.  She has no urinary complaints.  Patient offers no specific complaints today.  REVIEW OF SYSTEMS:   Review of Systems  Constitutional: Negative.  Negative for fever, malaise/fatigue and weight loss.  HENT:  Negative for sore throat.   Respiratory: Negative.  Negative for cough, hemoptysis and shortness of breath.   Cardiovascular: Negative.  Negative for chest pain and leg swelling.  Gastrointestinal: Negative.  Negative for abdominal pain.  Genitourinary: Negative.  Negative for dysuria.  Musculoskeletal:  Negative for neck pain.  Skin: Negative.  Negative for rash.  Neurological: Negative.  Negative for dizziness, focal weakness, weakness and headaches.  Psychiatric/Behavioral: Negative.  The patient is not nervous/anxious.     As per HPI. Otherwise, a complete review of systems is negative.  PAST MEDICAL HISTORY: Past Medical History:  Diagnosis Date   Depression    Difficult intubation    Endometriosis    Tongue cancer [facility])     PAST SURGICAL HISTORY: Past Surgical History:  Procedure Laterality Date   ABDOMINAL  HYSTERECTOMY  11/20/2018   [facility]   APPENDECTOMY  11/20/2018   [facility]   COLON RESECTION     DIRECT LARYNGOSCOPY N/A 07/21/2022   Procedure: DIRECT MICROLARYNGOSCOPY;  Surgeon: Vernie Murders, MD;  Location: [facility];  Service: ENT;  Laterality: N/A;   IR IMAGING GUIDED PORT INSERTION  09/29/2022   IR REMOVAL TUN ACCESS W/ PORT W/O FL MOD SED  05/16/2023   LAPAROSCOPY  01/25/2018   Procedure: LAPAROSCOPY DIAGNOSTIC;  Surgeon: Conard Novak, MD;  Location: [facility] ORS;  Service: Gynecology;;   TONGUE BIOPSY N/A 07/21/2022   Procedure: TONGUE BASE BIOPSY;  Surgeon: Vernie Murders, MD;  Location: [facility];  Service: ENT;  Laterality: N/A;    FAMILY HISTORY: Family History  Problem Relation Age of Onset   Cancer Maternal Uncle     ADVANCED DIRECTIVES (Y/N):  N  HEALTH MAINTENANCE: Social History   Tobacco Use   Smoking status: Former    Current packs/day: 0.00    Average packs/day: 0.8 packs/day for 30.0 years (22.5 ttl pk-yrs)    Types: Cigarettes    Start date: 07/05/1992    Quit date: 07/05/2022    Years since quitting: 1.3   Smokeless tobacco: Never   Tobacco comments:       Vaping Use   Vaping status: Never Used  Substance Use Topics   Alcohol use: No   Drug use: No     Colonoscopy:  PAP:  Bone density:  Lipid panel:  Allergies  Allergen Reactions   Chlorhexidine Gluconate Rash   Other Hives and Rash    DuraPrep Surgical Solution    Current  Outpatient Medications  Medication Sig Dispense Refill   cyclobenzaprine (FLEXERIL) 10 MG tablet Take 10 mg by mouth at bedtime. (Patient not taking: Reported on 11/17/2023)     HYDROcodone-acetaminophen (NORCO/VICODIN) 5-325 MG tablet Take 1 tablet by mouth every 4 (four) hours as needed. (Patient not taking: Reported on 11/17/2023)     lidocaine (XYLOCAINE) 2 % solution Use as directed 15 mLs in the mouth or throat every 6 (six) hours as needed for mouth pain. (Patient not taking: Reported on 11/17/2023) 250  mL 0   lidocaine-prilocaine (EMLA) cream Apply 1 Application topically as needed. (Patient not taking: Reported on 11/17/2023) 30 g 0   traMADol (ULTRAM) 50 MG tablet Take 1 tablet (50 mg total) by mouth every 12 (twelve) hours as needed. (Patient not taking: Reported on 11/17/2023) 30 tablet 0   No current facility-administered medications for this visit.    OBJECTIVE: Vitals:   11/17/23 1028  BP: 123/81  Pulse: 69  Resp: 16  Temp: (!) 96.4 F (35.8 C)  SpO2: 100%     Body mass index is 20.33 kg/m.    ECOG FS:0 - Asymptomatic  General: Well-developed, well-nourished, no acute distress. Eyes: Pink conjunctiva, anicteric sclera. HEENT: Normocephalic, moist mucous membranes.  No palpable lymphadenopathy. Lungs: No audible wheezing or coughing. Heart: Regular rate and rhythm. Abdomen: Soft, nontender, no obvious distention. Musculoskeletal: No edema, cyanosis, or clubbing. Neuro: Alert, answering all questions appropriately. Cranial nerves grossly intact. Skin: No rashes or petechiae noted. Psych: Normal affect.  LAB RESULTS:  Lab Results  Component Value Date   NA 137 05/10/2023   K 3.8 05/10/2023   CL 103 05/10/2023   CO2 25 05/10/2023   GLUCOSE 101 (H) 05/10/2023   BUN 9 05/10/2023   CREATININE 0.73 05/10/2023   CALCIUM 9.4 05/10/2023   PROT 7.2 05/10/2023   ALBUMIN 4.3 05/10/2023   AST 18 05/10/2023   ALT 17 05/10/2023   ALKPHOS 84 05/10/2023   BILITOT 0.3 05/10/2023   GFRNONAA >60 05/10/2023   GFRAA >60 01/15/2018    Lab Results  Component Value Date   WBC 4.6 05/10/2023   NEUTROABS 3.4 05/10/2023   HGB 12.1 05/10/2023   HCT 36.7 05/10/2023   MCV 95.1 05/10/2023   PLT 217 05/10/2023     STUDIES: No results found.  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  Patient completed weekly cisplatin along with daily XRT on March 22, 2023.  CT scans from March 10, 2023 reviewed independently with no obvious evidence of recurrence.  No further imaging is necessary unless there is suspicion of progression of disease.  Patient reports a recent ENT exam was reported as normal.  No intervention is needed at this time.  Continue follow-up with ENT as scheduled.  Return to clinic in 6 months for routine evaluation.   Jaw pain/trismus: Resolved.  Continue follow-up with ENT.  I spent a total of 20 minutes reviewing chart data, face-to-face evaluation with the patient, counseling and coordination of care as detailed above.   Patient expressed understanding and was in agreement with this plan. She also understands that She can call clinic at any time with any questions, concerns, or complaints.    Cancer Staging  Tongue cancer (HCC) Staging form: Oral Cavity, AJCC 8th Edition -  Clinical stage from 08/05/2022: Stage III (cT3, cN1, cM0) - Signed by Jeralyn Ruths, MD on 08/05/2022 Stage prefix: Initial diagnosis   Jeralyn Ruths, MD   11/17/2023 10:41 AM

## 2023-11-18 ENCOUNTER — Other Ambulatory Visit: Payer: Self-pay

## 2023-11-20 ENCOUNTER — Inpatient Hospital Stay: Payer: Self-pay

## 2023-11-20 NOTE — Progress Notes (Signed)
 [facility] Clinical Social Work  Clinical Social Work was referred by medical provider, Dr. Orlie Dakin, for assessment of psychosocial needs.  Clinical Social Worker contacted patient by phone to offer support and assess for needs.    Patient stated she had questions regarding the health insurance she chose through [employer].  Securely emailed patient contact information for the [employer] that answers questions regarding health insurance.  Provided active listening and supportive counseling.     Dorothey Baseman, LCSW  Clinical Social Worker [facility]

## 2024-02-21 ENCOUNTER — Other Ambulatory Visit: Payer: Self-pay

## 2024-05-16 ENCOUNTER — Ambulatory Visit: Payer: Self-pay | Admitting: Oncology

## 2024-06-06 ENCOUNTER — Encounter: Payer: Self-pay | Admitting: Oncology

## 2024-06-06 ENCOUNTER — Inpatient Hospital Stay: Payer: Self-pay | Attending: Oncology | Admitting: Oncology

## 2024-06-06 VITALS — BP 125/79 | HR 64 | Temp 98.6°F | Resp 20 | Wt 131.0 lb

## 2024-06-06 DIAGNOSIS — Z809 Family history of malignant neoplasm, unspecified: Secondary | ICD-10-CM | POA: Insufficient documentation

## 2024-06-06 DIAGNOSIS — Z9221 Personal history of antineoplastic chemotherapy: Secondary | ICD-10-CM | POA: Insufficient documentation

## 2024-06-06 DIAGNOSIS — K007 Teething syndrome: Secondary | ICD-10-CM | POA: Insufficient documentation

## 2024-06-06 DIAGNOSIS — C01 Malignant neoplasm of base of tongue: Secondary | ICD-10-CM | POA: Insufficient documentation

## 2024-06-06 DIAGNOSIS — Z923 Personal history of irradiation: Secondary | ICD-10-CM | POA: Insufficient documentation

## 2024-06-06 DIAGNOSIS — C029 Malignant neoplasm of tongue, unspecified: Secondary | ICD-10-CM

## 2024-06-06 DIAGNOSIS — Z87891 Personal history of nicotine dependence: Secondary | ICD-10-CM | POA: Insufficient documentation

## 2024-06-06 NOTE — Progress Notes (Signed)
 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  INTERVAL HISTORY: Patient returns to clinic today for routine 50-month evaluation.  She continues to have problems with dentition and is looking for a dentist.  She otherwise feels well.  She no longer has any trismus or dysphagia.  She continues to gain weight.  She otherwise feels well. She has no neurologic complaints.  She denies any recent fevers or illnesses.  She has no chest pain, shortness of breath, cough, or hemoptysis.  She denies any nausea, vomiting, constipation, or diarrhea.  She has no urinary complaints.  Patient offers no further specific complaints today.  REVIEW OF SYSTEMS:   Review of Systems  Constitutional: Negative.  Negative for fever, malaise/fatigue and weight loss.  HENT:  Negative for sore throat.   Respiratory: Negative.  Negative for cough, hemoptysis and shortness of breath.   Cardiovascular: Negative.  Negative for chest pain and leg swelling.  Gastrointestinal: Negative.  Negative for abdominal pain.  Genitourinary: Negative.  Negative for dysuria.  Musculoskeletal:  Negative for neck pain.  Skin: Negative.  Negative for rash.  Neurological: Negative.  Negative for dizziness, focal weakness, weakness and headaches.  Psychiatric/Behavioral: Negative.  The patient is not nervous/anxious.     As per HPI. Otherwise, a complete review of systems is negative.  PAST MEDICAL HISTORY: Past Medical History:  Diagnosis Date   Depression    Difficult intubation    Endometriosis    Tongue cancer [facility])     PAST SURGICAL HISTORY: Past Surgical History:  Procedure Laterality Date   ABDOMINAL  HYSTERECTOMY  11/20/2018   [facility]   APPENDECTOMY  11/20/2018   [facility]   COLON RESECTION     DIRECT LARYNGOSCOPY N/A 07/21/2022   Procedure: DIRECT MICROLARYNGOSCOPY;  Surgeon: Edda Mt, MD;  Location: [facility];  Service: ENT;  Laterality: N/A;   IR IMAGING GUIDED PORT INSERTION  09/29/2022   IR REMOVAL TUN ACCESS W/ PORT W/O FL MOD SED  05/16/2023   LAPAROSCOPY  01/25/2018   Procedure: LAPAROSCOPY DIAGNOSTIC;  Surgeon: Leonce Garnette BIRCH, MD;  Location: [facility] OR[facility];  Service: Gynecology;;   TONGUE BIOPSY N/A 07/21/2022   Procedure: TONGUE BASE BIOPSY;  Surgeon: Edda Mt, MD;  Location: [facility];  Service: ENT;  Laterality: N/A;    FAMILY HISTORY: Family History  Problem Relation Age of Onset   Cancer Maternal Uncle     ADVANCED DIRECTIVES (Y/N):  N  HEALTH MAINTENANCE: Social History   Tobacco Use   Smoking status: Former    Current packs/day: 0.00    Average packs/day: 0.8 packs/day for 30.0 years (22.5 ttl pk-yrs)    Types: Cigarettes    Start date: 07/05/1992    Quit date: 07/05/2022    Years since quitting: 1.9   Smokeless tobacco: Never   Tobacco comments:       Vaping Use   Vaping status: Never Used  Substance Use Topics   Alcohol use: No   Drug use: No     Colonoscopy:  PAP:  Bone density:  Lipid panel:  Allergies  Allergen Reactions   Chlorhexidine  Gluconate Rash   Other Hives and Rash    DuraPrep Surgical Solution  Current Outpatient Medications  Medication Sig Dispense Refill   cyclobenzaprine (FLEXERIL) 10 MG tablet Take 10 mg by mouth at bedtime. (Patient not taking: Reported on 06/06/2024)     HYDROcodone -acetaminophen  (NORCO/VICODIN) 5-325 MG tablet Take 1 tablet by mouth every 4 (four) hours as needed. (Patient not taking: Reported on 06/06/2024)     lidocaine  (XYLOCAINE ) 2 % solution Use as directed 15 mLs in the mouth or throat every 6 (six) hours as needed for mouth pain. (Patient not taking: Reported on 06/06/2024) 250  mL 0   lidocaine -prilocaine  (EMLA ) cream Apply 1 Application topically as needed. (Patient not taking: Reported on 06/06/2024) 30 g 0   traMADol  (ULTRAM ) 50 MG tablet Take 1 tablet (50 mg total) by mouth every 12 (twelve) hours as needed. (Patient not taking: Reported on 06/06/2024) 30 tablet 0   No current facility-administered medications for this visit.    OBJECTIVE: Vitals:   06/06/24 1046  BP: 125/79  Pulse: 64  Resp: 20  Temp: 98.6 F (37 C)  SpO2: 100%     Body mass index is 20.52 kg/m.    ECOG FS:0 - Asymptomatic  General: Well-developed, well-nourished, no acute distress. Eyes: Pink conjunctiva, anicteric sclera. HEENT: Normocephalic, moist mucous membranes.  Clear oropharynx, no palpable lymphadenopathy. Lungs: No audible wheezing or coughing. Heart: Regular rate and rhythm. Abdomen: Soft, nontender, no obvious distention. Musculoskeletal: No edema, cyanosis, or clubbing. Neuro: Alert, answering all questions appropriately. Cranial nerves grossly intact. Skin: No rashes or petechiae noted. Psych: Normal affect.  LAB RESULTS:  Lab Results  Component Value Date   NA 137 05/10/2023   K 3.8 05/10/2023   CL 103 05/10/2023   CO2 25 05/10/2023   GLUCOSE 101 (H) 05/10/2023   BUN 9 05/10/2023   CREATININE 0.73 05/10/2023   CALCIUM 9.4 05/10/2023   PROT 7.2 05/10/2023   ALBUMIN 4.3 05/10/2023   AST 18 05/10/2023   ALT 17 05/10/2023   ALKPHOS 84 05/10/2023   BILITOT 0.3 05/10/2023   GFRNONAA >60 05/10/2023   GFRAA >60 01/15/2018    Lab Results  Component Value Date   WBC 4.6 05/10/2023   NEUTROABS 3.4 05/10/2023   HGB 12.1 05/10/2023   HCT 36.7 05/10/2023   MCV 95.1 05/10/2023   PLT 217 05/10/2023     STUDIES: No results found.  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  CT scans from March 10, 2023 reviewed independently with no obvious evidence of recurrence.  No further imaging is necessary unless there is suspicion of progression of disease.  Patient has not been evaluated by ENT in approximately 1 year.  A referral was sent back for further evaluation.  No further intervention is needed.  Return to clinic in 6 months for routine evaluation.    Jaw pain/trismus: Resolved.  Follow-up with ENT as above. Dentition: Patient reports she is looking to schedule with an appointment with dentistry in the near future.   Patient expressed understanding and was in agreement with this plan. She also understands that She can call clinic at any time with any questions, concerns, or complaints.    Cancer Staging  Tongue cancer [facility]) Staging form: Oral Cavity, AJCC 8th Edition - Clinical stage from 08/05/2022: Stage III (cT3, cN1, cM0) - Signed by Holly Evalene PARAS,  MD on 08/05/2022 Stage prefix: Initial diagnosis   Evalene JINNY Reusing, MD   06/06/2024 11:16 AM

## 2024-06-07 ENCOUNTER — Other Ambulatory Visit: Payer: Self-pay

## 2024-12-04 ENCOUNTER — Ambulatory Visit: Payer: Self-pay | Admitting: Oncology
